# Patient Record
Sex: Male | Born: 1975 | Hispanic: No | Marital: Married | Smoking: Never smoker
Health system: Southern US, Community
[De-identification: ages and names within clinical notes are randomized; demographics above are authoritative.]

## PROBLEM LIST (undated history)

## (undated) DIAGNOSIS — F1020 Alcohol dependence, uncomplicated: Secondary | ICD-10-CM

## (undated) DIAGNOSIS — I10 Essential (primary) hypertension: Secondary | ICD-10-CM

## (undated) DIAGNOSIS — E119 Type 2 diabetes mellitus without complications: Secondary | ICD-10-CM

## (undated) DIAGNOSIS — E785 Hyperlipidemia, unspecified: Secondary | ICD-10-CM

## (undated) DIAGNOSIS — E669 Obesity, unspecified: Secondary | ICD-10-CM

## (undated) DIAGNOSIS — K760 Fatty (change of) liver, not elsewhere classified: Secondary | ICD-10-CM

## (undated) DIAGNOSIS — K219 Gastro-esophageal reflux disease without esophagitis: Secondary | ICD-10-CM

## (undated) DIAGNOSIS — R000 Tachycardia, unspecified: Secondary | ICD-10-CM

## (undated) HISTORY — DX: Tachycardia, unspecified: R00.0

## (undated) HISTORY — DX: Alcohol dependence, uncomplicated: F10.20

## (undated) HISTORY — DX: Gastro-esophageal reflux disease without esophagitis: K21.9

## (undated) HISTORY — DX: Type 2 diabetes mellitus without complications: E11.9

## (undated) HISTORY — DX: Hyperlipidemia, unspecified: E78.5

## (undated) HISTORY — DX: Fatty (change of) liver, not elsewhere classified: K76.0

## (undated) HISTORY — PX: OTHER SURGICAL HISTORY: SHX169

---

## 2009-07-17 ENCOUNTER — Ambulatory Visit: Payer: Self-pay | Admitting: Pulmonary Disease

## 2009-07-17 DIAGNOSIS — K219 Gastro-esophageal reflux disease without esophagitis: Secondary | ICD-10-CM | POA: Insufficient documentation

## 2009-07-17 DIAGNOSIS — R109 Unspecified abdominal pain: Secondary | ICD-10-CM | POA: Insufficient documentation

## 2009-07-17 DIAGNOSIS — E669 Obesity, unspecified: Secondary | ICD-10-CM | POA: Insufficient documentation

## 2009-07-17 DIAGNOSIS — I10 Essential (primary) hypertension: Secondary | ICD-10-CM | POA: Insufficient documentation

## 2009-07-23 ENCOUNTER — Ambulatory Visit: Payer: Self-pay | Admitting: Pulmonary Disease

## 2009-07-23 ENCOUNTER — Ambulatory Visit (HOSPITAL_COMMUNITY): Admission: RE | Admit: 2009-07-23 | Discharge: 2009-07-23 | Payer: Self-pay | Admitting: Pulmonary Disease

## 2009-07-24 LAB — CONVERTED CEMR LAB
ALT: 27 units/L (ref 0–53)
AST: 30 units/L (ref 0–37)
Alkaline Phosphatase: 60 units/L (ref 39–117)
BUN: 15 mg/dL (ref 6–23)
Basophils Absolute: 0 10*3/uL (ref 0.0–0.1)
Basophils Relative: 0.4 % (ref 0.0–3.0)
Bilirubin Urine: NEGATIVE
Calcium: 9.1 mg/dL (ref 8.4–10.5)
Cholesterol: 190 mg/dL (ref 0–200)
Creatinine, Ser: 0.8 mg/dL (ref 0.4–1.5)
Eosinophils Absolute: 0.1 10*3/uL (ref 0.0–0.7)
Eosinophils Relative: 1.9 % (ref 0.0–5.0)
GFR calc non Af Amer: 123.09 mL/min (ref >60)
HDL: 43.9 mg/dL (ref >39.00)
Ketones, ur: NEGATIVE mg/dL
Lymphocytes Relative: 40.4 % (ref 12.0–46.0)
MCV: 88 fL (ref 78.0–100.0)
Monocytes Relative: 9.4 % (ref 3.0–12.0)
Neutro Abs: 3 10*3/uL (ref 1.4–7.7)
Neutrophils Relative %: 47.9 % (ref 43.0–77.0)
Potassium: 4.8 meq/L (ref 3.5–5.1)
Specific Gravity, Urine: >=1.03 (ref 1.000–1.030)
TSH: 1.67 microintl units/mL (ref 0.35–5.50)
Total Bilirubin: 0.9 mg/dL (ref 0.3–1.2)
Total CHOL/HDL Ratio: 4
Total Protein: 6.9 g/dL (ref 6.0–8.3)
Urobilinogen, UA: 0.2 (ref 0.0–1.0)
VLDL: 25.8 mg/dL (ref 0.0–40.0)
WBC: 6.2 10*3/uL (ref 4.5–10.5)
pH: 6 (ref 5.0–8.0)

## 2010-03-16 NOTE — Assessment & Plan Note (Signed)
Summary: CHEST PAIN--OK PER DR Ivelisse Culverhouse/[facility]   CC:  New patient physical....  History of Present Illness: Peter Keller is a 35 y/o gentleman, husb of Dr. Herma Carson, and referred to establish as a general medical pt for CPX...    Current Problems:   HYPERTENSION (ICD-401.9) - hx HBP x yrs most recently Rx w/ ATENOLOL 25mg /d... he relates it to his weight, noting that BP was OK in past w/ weight reduction... no known end-organ problems and he is essentially asymptomatic- denies HA, fatigue, visual changes, CP, palipit, dizziness, syncope, dyspnea, edema, etc...   ~  6/11:  BP= 160/100 initially, but improved to 140/80 after rest... we discussed incr Aten to 50mg /d, weight reducing diet, exercise, etc... hopefully he'll be able to wean down & off meds w/ wt reduction.  OVERWEIGHT (ICD-278.02) - weight = 218#, 69" tall, BMI= 32... he notes substantial weight reduction on diet in the past & believes he can do it again... seems motivated & we discussed diet + exercise (walking, swimming, etc).  GERD (ICD-530.81) - he uses PRILOSEC 20mg  Prn for intermittent reflux symptoms... no prev GI eval.  ABDOMINAL PAIN, UNSPECIFIED SITE (ICD-789.00) - he notes intermittent vague upper GI, RUQ, & bilat flank discomfort off & on... he's been worried about his GB & we discussed checking an abd ultrasound for completeness.  ~  6/11:  Abd Sonar is neg x for mild fatty liver, he has normal LFT's, & rec to get wt down...   Allergies (verified): No Known Drug Allergies  Comments:  Nurse/Medical Assistant: The patient's medications and allergies were reviewed with the patient and were updated in the Medication and Allergy Lists.  Past History:  Past Medical History: HYPERTENSION (ICD-401.9) OVERWEIGHT (ICD-278.02) GERD (ICD-530.81) ABDOMINAL PAIN, UNSPECIFIED SITE (ICD-789.00)  Past Surgical History: S/P drainage of left buttock abscess at age 62 after vaccination S/P anal fissure surg  ~2006 in  [place]  Family History: Reviewed history and no changes required. Father alive, age 627, good general health- mild HBP. Mother alive, age 81, hx HBP & DM on insulin. 2 Siblings- Sisters aged 50 & 71, one w/ HBP & overwt.  He notes that his paternal grandfather has had an MI, & paternal uncle w/ CABG...  Social History: Reviewed history and no changes required. Married, wife= Dr. Velta Addison, 53yrs. 2 Children- aged 2 & 4 Essentially a non-smoker (1 cigar per yr) Etoh= red wine daily Works as Nurse, adult  Review of Systems  The patient denies fever, chills, sweats, anorexia, fatigue, weakness, malaise, weight loss, sleep disorder, blurring, diplopia, eye irritation, eye discharge, vision loss, eye pain, photophobia, earache, ear discharge, tinnitus, decreased hearing, nasal congestion, nosebleeds, sore throat, hoarseness, chest pain, palpitations, syncope, dyspnea on exertion, orthopnea, PND, peripheral edema, cough, dyspnea at rest, excessive sputum, hemoptysis, wheezing, pleurisy, nausea, vomiting, diarrhea, constipation, change in bowel habits, abdominal pain, melena, hematochezia, jaundice, gas/bloating, indigestion/heartburn, dysphagia, odynophagia, dysuria, hematuria, urinary frequency, urinary hesitancy, nocturia, incontinence, back pain, joint pain, joint swelling, muscle cramps, muscle weakness, stiffness, arthritis, sciatica, restless legs, leg pain at night, leg pain with exertion, rash, itching, dryness, suspicious lesions, paralysis, paresthesias, seizures, tremors, vertigo, transient blindness, frequent falls, frequent headaches, difficulty walking, depression, anxiety, memory loss, confusion, cold intolerance, heat intolerance, polydipsia, polyphagia, polyuria, unusual weight change, abnormal bruising, bleeding, enlarged lymph nodes, urticaria, allergic rash, hay fever, and recurrent infections.    Vital Signs:  Patient profile:   35 year old male Height:      69 inches Weight:  218 pounds BMI:     32.31 O2 Sat:      98 % on Room air Temp:     98.2 degrees F oral Pulse rate:   86 / minute BP sitting:   158 / 100  (left arm) Cuff size:   regular  Vitals Entered By: Randell Loop CMA (July 17, 2009 3:09 PM)  O2 Sat at Rest %:  98 O2 Flow:  Room air  Physical Exam  Additional Exam:  WD, sl overweight, 35 y/o gentleman in NAD... GENERAL:  Alert & oriented; pleasant & cooperative... HEENT:  [place]/AT, EOM-wnl, PERRLA, EACs-clear, TMs-wnl, NOSE-clear, THROAT-clear & wnl. NECK:  Supple w/ full ROM; no JVD; normal carotid impulses w/o bruits; no thyromegaly or nodules palpated; no lymphadenopathy. CHEST:  Clear to P & A; without wheezes/ rales/ or rhonchi. HEART:  Regular Rhythm; without murmurs/ rubs/ or gallops. ABDOMEN:  Soft & nontender; normal bowel sounds; no organomegaly or masses detected. RECTAL:  Neg - prostate 2+ & nontender w/o nodules; stool hematest neg. Scar & defect left buttock from remote abscess drainage... EXT: without deformities or arthritic changes; no varicose veins/ venous insuffic/ or edema. NEURO:  CN's intact; motor testing normal; sensory testing normal; gait normal & balance OK. DERM:  No lesions noted; no rash etc...    CXR  Procedure date:  07/17/2009  Findings:      CHEST - 2 VIEW Comparison: None.   Findings: Heart and mediastinal contours are within normal limits. The lung fields are clear with no signs of focal infiltrate or congestive failure.  No pleural fluid or peribronchial cuffing is seen.  Bony structures appear intact.   IMPRESSION: No acute cardiopulmonary abnormality noted.   Read By:  Bertha Stakes,  M.D.    EKG  Procedure date:  07/17/2009  Findings:      Normal sinus rhythm with rate of:  76/ min... Tracing is WNL, NAD...  SN   MISC. Report  Procedure date:  07/23/2009  Findings:      Lipid Panel (LIPID)   Cholesterol               190 mg/dL                   0-454   Triglycerides              129.0 mg/dL                 0.9-811.9   HDL                       14.78 mg/dL                 >29.56   LDL Cholesterol      [H]  213 mg/dL                   0-86  BMP (METABOL)   Sodium                    142 mEq/L                   135-145   Potassium                 4.8 mEq/L                   3.5-5.1   Chloride  107 mEq/L                   96-112   Carbon Dioxide            28 mEq/L                    19-32   Glucose                   98 mg/dL                    34-74   BUN                       15 mg/dL                    2-59   Creatinine                0.8 mg/dL                   5.6-3.8   Calcium                   9.1 mg/dL                   7.5-64.3   GFR                       123.09 mL/min               >60  Hepatic/Liver Function Panel (HEPATIC)   Total Bilirubin           0.9 mg/dL                   3.2-9.5   Direct Bilirubin          0.1 mg/dL                   1.8-8.4   Alkaline Phosphatase      60 U/L                      39-117   AST                       30 U/L                      0-37   ALT                       27 U/L                      0-53   Total Protein             6.9 g/dL                    1.6-6.0   Albumin                   4.1 g/dL                    6.3-0.1  Comments:      CBC Platelet w/Diff (CBCD)   White Cell Count          6.2 K/uL  4.5-10.5   Red Cell Count            5.08 Mil/uL                 4.22-5.81   Hemoglobin                16.0 g/dL                   16.1-09.6   Hematocrit                44.7 %                      39.0-52.0   MCV                       88.0 fl                     78.0-100.0   Platelet Count            200.0 K/uL                  150.0-400.0   Neutrophil %              47.9 %                      43.0-77.0   Lymphocyte %              40.4 %                      12.0-46.0   Monocyte %                9.4 %                       3.0-12.0   Eosinophils%              1.9 %                        0.0-5.0   Basophils %               0.4 %                       0.0-3.0  TSH (TSH)   FastTSH                   1.67 uIU/mL                 0.35-5.50  UDip Only (UDIP)   Color                     LT. YELLOW   Clarity                   CLEAR                       Clear   Specific Gravity          >=1.030                     1.000 - 1.030   Urine Ph                  6.0  5.0-8.0   Protein                   NEGATIVE                    Negative   Urine Glucose             NEGATIVE                    Negative   Ketones                   NEGATIVE                    Negative   Urine Bilirubin           NEGATIVE                    Negative   Blood                     TRACE-LYSED                 Negative  Urobilinogen              0.2                         0.0 - 1.0   Leukocyte Esterace        NEGATIVE                    Negative   Nitrite                   NEGATIVE                    Negative  Tests: (7) Hemoglobin A1C (A1C)   Hemoglobin A1C            5.4 %                       4.6-6.5   [place] of Abdomen  Procedure date:  07/23/2009  Findings:      IMPRESSION: Question mild fatty infiltration of the liver, otherwise unremarkable abdominal ultrasound.   Read By:  Rosendo Gros,  M.D.    Impression & Recommendations:  Problem # 1:  PHYSICAL EXAMINATION (ICD-V70.0)  Orders: EKG w/ Interpretation (93000) T-2 View CXR (71020TC) He will return to our lab for FASTING blood work >> all essentially WNL, LDL = 120, rec diet Rx.  Problem # 2:  HYPERTENSION (ICD-401.9) BP initially  ~160/100 & improved to 140/80 w/ rest... we discussed incr the ATENOLOL to 50mg /d & monitor BP at home... he will increase exercise program as well... His updated medication list for this problem includes:    Atenolol 50 Mg Tabs (Atenolol) .Marland Kitchen... Take 1 tab by mouth once daily...  Problem # 3:  OVERWEIGHT (ICD-278.02) BMI= 32 and we discussed diet + exercise for weight  reduction program... he actually lost substantial weight in the past & believes that he can do it again...  Problem # 4:  GERD (ICD-530.81) Hx mild intermittent GERD symptoms in the past>  responded well to [facility] OTC. His updated medication list for this problem includes:    Prilosec Otc 20 Mg Tbec (Omeprazole magnesium) .Marland Kitchen... As needed for heartburn  Problem # 5:  ABDOMINAL PAIN, UNSPECIFIED SITE (ICD-789.00) He is concerned about his gallbladder  and we discussed checking routine labs and an abd ultrasound for completeness... LABS & ABD ULTRASOUND are neg, WNL x for mild fatty liver which should improve w/ weight reduction & moderation of the wine intake.  Orders: Radiology Referral (Radiology)  Complete Medication List: 1)  Atenolol 50 Mg Tabs (Atenolol) .... Take 1 tab by mouth once daily.Marland KitchenMarland Kitchen 2)  Prilosec Otc 20 Mg Tbec (Omeprazole magnesium) .... As needed for heartburn  Patient Instructions: 1)  Sonny, it was great meeting you.Marland KitchenMarland Kitchen 2)  Today we did your initial CXR & EKG... 3)  Please return to our lab one morning next week for your FASTING blood work... 4)  We will arrange for an ABDOMINAL ULTRASOUND as well... 5)  We will call you w/ these results and mail copies for your records... 6)  We wrote a new perscription for ATENOLOL 50mg  - take one tab daily.Marland KitchenMarland Kitchen 7)  We need to work on diet & exercise- the goal is to lose 15-20 lbs... 8)  Call for any problems... Prescriptions: ATENOLOL 50 MG TABS (ATENOLOL) take 1 tab by mouth once daily...  #90 x prn   Entered and Authorized by:   Michele Mcalpine MD   Signed by:   Michele Mcalpine MD on 07/17/2009   Method used:   Print then Give to Patient   RxID:   [id number]    CardioPerfect ECG  ID: [id number] Patient: Kenn File DOB: [birth year] Age: 35 Years Old Sex: Male Race: Other Physician: Nolia Tschantz Technician: Randell Loop CMA Height: 69 Weight: 218 Status: Unconfirmed Recorded: 07/17/2009 3:25 PM P/PR: 122 ms / 183  ms - Heart rate (maximum exercise) QRS: 89 QT/QTc/QTd: 360 ms / 388 ms / 48 ms - Heart rate (maximum exercise)  P/QRS/T axis: 34 deg / 59 deg / 66 deg - Heart rate (maximum exercise)  Heartrate: 76 bpm  Interpretation:  Normal sinus rhythm with rate of:  76/ min... Tracing is WNL, NAD...  SN

## 2010-04-09 ENCOUNTER — Telehealth (INDEPENDENT_AMBULATORY_CARE_PROVIDER_SITE_OTHER): Payer: Self-pay | Admitting: *Deleted

## 2010-04-13 NOTE — Progress Notes (Signed)
  Phone Note Other Incoming   Request: Send information Summary of Call: Request for records received from [facility].Request forwarded to [employer].  5 yrs-Nadel

## 2010-12-27 ENCOUNTER — Other Ambulatory Visit: Payer: Self-pay

## 2010-12-27 ENCOUNTER — Encounter (HOSPITAL_COMMUNITY): Payer: Self-pay

## 2010-12-27 ENCOUNTER — Ambulatory Visit (HOSPITAL_COMMUNITY)
Admission: RE | Admit: 2010-12-27 | Discharge: 2010-12-27 | Disposition: A | Discharge status: Home / Self Care | Payer: 59 | Source: Ambulatory Visit | Attending: Internal Medicine | Admitting: Internal Medicine

## 2010-12-27 VITALS — BP 144/86 | HR 75 | Ht 69.0 in | Wt 228.5 lb

## 2010-12-27 DIAGNOSIS — R0602 Shortness of breath: Secondary | ICD-10-CM | POA: Insufficient documentation

## 2010-12-27 DIAGNOSIS — R0683 Snoring: Secondary | ICD-10-CM | POA: Insufficient documentation

## 2010-12-27 DIAGNOSIS — R072 Precordial pain: Secondary | ICD-10-CM | POA: Insufficient documentation

## 2010-12-27 DIAGNOSIS — R0789 Other chest pain: Secondary | ICD-10-CM

## 2010-12-27 DIAGNOSIS — F1020 Alcohol dependence, uncomplicated: Secondary | ICD-10-CM | POA: Insufficient documentation

## 2010-12-27 DIAGNOSIS — R079 Chest pain, unspecified: Secondary | ICD-10-CM | POA: Insufficient documentation

## 2010-12-27 DIAGNOSIS — R0989 Other specified symptoms and signs involving the circulatory and respiratory systems: Secondary | ICD-10-CM | POA: Insufficient documentation

## 2010-12-27 DIAGNOSIS — R0609 Other forms of dyspnea: Secondary | ICD-10-CM | POA: Insufficient documentation

## 2010-12-27 DIAGNOSIS — E785 Hyperlipidemia, unspecified: Secondary | ICD-10-CM | POA: Insufficient documentation

## 2010-12-27 HISTORY — DX: Obesity, unspecified: E66.9

## 2010-12-27 HISTORY — DX: Essential (primary) hypertension: I10

## 2010-12-27 LAB — CBC
HCT: 45.4 % (ref 39.0–52.0)
Hemoglobin: 16.9 g/dL (ref 13.0–17.0)
MCH: 31.4 pg (ref 26.0–34.0)
MCHC: 37.2 g/dL — ABNORMAL HIGH (ref 30.0–36.0)
Platelets: 177 10*3/uL (ref 150–400)
RDW: 12.8 % (ref 11.5–15.5)

## 2010-12-27 LAB — LIPID PANEL
Cholesterol: 221 mg/dL — ABNORMAL HIGH (ref 0–200)
Total CHOL/HDL Ratio: 3.9 RATIO
Triglycerides: 129 mg/dL (ref <150)

## 2010-12-27 LAB — COMPREHENSIVE METABOLIC PANEL
ALT: 48 U/L (ref 0–53)
Albumin: 4 g/dL (ref 3.5–5.2)
Chloride: 101 mEq/L (ref 96–112)
Creatinine, Ser: 0.73 mg/dL (ref 0.50–1.35)
GFR calc non Af Amer: >90 mL/min (ref >90)
Glucose, Bld: 110 mg/dL — ABNORMAL HIGH (ref 70–99)
Potassium: 4.2 mEq/L (ref 3.5–5.1)
Total Bilirubin: 0.6 mg/dL (ref 0.3–1.2)
Total Protein: 7.9 g/dL (ref 6.0–8.3)

## 2010-12-27 LAB — TSH: TSH: 2.055 u[IU]/mL (ref 0.350–4.500)

## 2010-12-27 MED ORDER — LISINOPRIL-HYDROCHLOROTHIAZIDE 20-12.5 MG PO TABS: 1.0000 | ORAL_TABLET | Freq: Every day | ORAL | Status: DC

## 2010-12-27 NOTE — Patient Instructions (Signed)
Wean down your Atenolol and stop Start Lisinopril/HCTZ 20/12.5 mg daily  Labs today  Your physician has requested that you have an echocardiogram. Echocardiography is a painless test that uses sound waves to create images of your heart. It provides your doctor with information about the size and shape of your heart and how well your heart's chambers and valves are working. This procedure takes approximately one hour. There are no restrictions for this procedure.  Your physician has requested that you have an exercise tolerance test. For further information please visit [email or web address] Please also follow instruction sheet, as given.  Your physician recommends that you schedule a follow-up appointment in: 3 months.

## 2010-12-27 NOTE — Assessment & Plan Note (Signed)
Suspect due primarily to obesity and deconditioning but will check echo to evaluate EF, diastolic parameters and right sided pressures. Encouraged to lose weight through diet and exercise.

## 2010-12-27 NOTE — Progress Notes (Signed)
  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 Isidoro Donning for further evaluation of CP and HTN.  Denies h/o cardiac problems. Has not had a stress test or cath. Over past 10 years has gained about 80 pounds. Started on Atenolol about 2-3 years ago. Joined a gym in June and was working out aggressively for 3 months with boot camp. However over past 2 months has gotten discouraged so is no longer going. Says he was able to do very well with strength activities but had trouble with cardio exercises. When he exercises feels tight in his chest and gets winded very easily. Also notes that he is a heavy sweater during exercise and at rest. No orthopnea, PND or lower extremity edema.   Eats a lot of pizza and drinks a bottle of wine 3-4x/week. Says diet typically pretty good but when he drinks his eating is out of control. Snores heavily and often wakes himself from sleep at night. Fatigued during the day and drinks coffee for a boost.   ROS: All other systems normal except as mentioned in HPI, past medical history and problem list.    Past Medical History  Diagnosis Date  . Hypertension   . Obesity     Medications: Atenolo 50mg  daily    No Known Allergies  History   Social History  . Marital Status: Married    Spouse Name: N/A    Number of Children: N/A  . Years of Education: N/A   Occupational History  . Not on file.   Social History Main Topics  . Smoking status: Never Smoker   . Smokeless tobacco: Not on file  . Alcohol Use: 1.5 oz/week    3 drink(s) per week  . Drug Use: No  . Sexually Active: Not on file   Other Topics Concern  . Not on file   Works as a Sport and exercise psychologist. Drinks a bottle of wine - 3-4x/week  Family History  Problem Relation Age of Onset  . Diabetic kidney disease Mother   . Hypertension Mother   . Heart disease  Paternal Uncle   . Heart attack Paternal Grandmother     PHYSICAL EXAM: Filed Vitals:   12/27/10 1004  BP: 144/86  Pulse: 75    General:  Obese. Mild diaphoresis No respiratory difficulty HEENT: normal [place] 3-4 airway Neck: thick/ supple. no JVD. Carotids 2+ bilat; no bruits. No lymphadenopathy or thryomegaly appreciated. Cor: PMI nondisplaced. Regular rate & rhythm. No rubs, gallops or murmurs. Lungs: clear Abdomen: obese, soft, nontender, nondistended. No hepatosplenomegaly. No bruits or masses. Good bowel sounds. Extremities: no cyanosis, clubbing, rash, edema Neuro: alert & oriented x 3, cranial nerves grossly intact. moves all 4 extremities w/o difficulty. Affect pleasant.  ECG:  NSR  No ST-T wave abnormalities.     Lipid Panel     Component Value Date/Time   CHOL 190 07/23/2009 0946   TRIG 129.0 07/23/2009 0946   HDL 43.90 07/23/2009 0946   CHOLHDL 4 07/23/2009 0946   VLDL 25.8 07/23/2009 0946   LDLCALC 120* 07/23/2009 0946

## 2010-12-27 NOTE — Assessment & Plan Note (Addendum)
Will recheck lipids, TSH and HgBA1c. Stressed need for diet, weight loss and exercise.

## 2010-12-27 NOTE — Assessment & Plan Note (Signed)
He almost certainly has OSA. We discussed referral for sleep study but he would like to defer and try weight loss. Will give him 3 months to make a concerted effort at weight loss. If not successful, refer for sleep study.

## 2010-12-27 NOTE — Assessment & Plan Note (Signed)
He has several cardiac risk factors. Will schedule ETT to exclude underlying ischemia.

## 2010-12-27 NOTE — Assessment & Plan Note (Signed)
This is a major issue for him. We discussed cutting back on ETOH but he said once he has one glass of wine usually ends up drinking the whole bottom. Stressed the need for complete abstinence. Talked about long-term risk of alcoholism.

## 2010-12-31 ENCOUNTER — Telehealth (HOSPITAL_COMMUNITY): Payer: Self-pay | Admitting: *Deleted

## 2010-12-31 MED ORDER — SIMVASTATIN 20 MG PO TABS: 20.0000 mg | ORAL_TABLET | Freq: Every evening | ORAL | Status: DC

## 2010-12-31 NOTE — Telephone Encounter (Signed)
Pt's wife aware, rx sent in 

## 2010-12-31 NOTE — Telephone Encounter (Signed)
Message copied by Noralee Space on Fri Dec 31, 2010 12:00 PM ------      Message from: Arvilla Meres R      Created: Mon Dec 27, 2010 10:19 PM       Cholesterol is high. Start simva 20. Blood glucose also a bit high. Reinforced need for diet and weight loss. I will forward to his wife.

## 2011-01-17 ENCOUNTER — Encounter (HOSPITAL_COMMUNITY): Payer: Medicare HMO

## 2011-01-17 ENCOUNTER — Other Ambulatory Visit (HOSPITAL_COMMUNITY): Payer: Self-pay | Admitting: Internal Medicine

## 2011-01-17 DIAGNOSIS — I509 Heart failure, unspecified: Secondary | ICD-10-CM

## 2011-01-18 ENCOUNTER — Encounter: Payer: Self-pay | Admitting: Cardiovascular Disease

## 2011-01-19 ENCOUNTER — Ambulatory Visit (HOSPITAL_COMMUNITY): Payer: Managed Care, Other (non HMO) | Attending: Cardiovascular Disease | Admitting: Radiology

## 2011-01-19 ENCOUNTER — Ambulatory Visit (INDEPENDENT_AMBULATORY_CARE_PROVIDER_SITE_OTHER): Payer: Managed Care, Other (non HMO) | Admitting: Physician Assistant

## 2011-01-19 DIAGNOSIS — I379 Nonrheumatic pulmonary valve disorder, unspecified: Secondary | ICD-10-CM | POA: Insufficient documentation

## 2011-01-19 DIAGNOSIS — I10 Essential (primary) hypertension: Secondary | ICD-10-CM | POA: Insufficient documentation

## 2011-01-19 DIAGNOSIS — R079 Chest pain, unspecified: Secondary | ICD-10-CM | POA: Insufficient documentation

## 2011-01-19 DIAGNOSIS — R0609 Other forms of dyspnea: Secondary | ICD-10-CM | POA: Insufficient documentation

## 2011-01-19 DIAGNOSIS — R072 Precordial pain: Secondary | ICD-10-CM

## 2011-01-19 DIAGNOSIS — R0602 Shortness of breath: Secondary | ICD-10-CM

## 2011-01-19 DIAGNOSIS — E785 Hyperlipidemia, unspecified: Secondary | ICD-10-CM | POA: Insufficient documentation

## 2011-01-19 DIAGNOSIS — I509 Heart failure, unspecified: Secondary | ICD-10-CM

## 2011-01-19 DIAGNOSIS — R0989 Other specified symptoms and signs involving the circulatory and respiratory systems: Secondary | ICD-10-CM | POA: Insufficient documentation

## 2011-01-19 NOTE — Progress Notes (Signed)
Exercise Treadmill Test  Pre-Exercise Testing Evaluation Rhythm: normal sinus  Rate: 99   PR:  .15 QRS:  .08  QT:  .34 QTc: .44     Test  Exercise Tolerance Test Ordering MD: Arvilla Meres, MD  Interpreting MD:  Tereso Newcomer PA-C  Unique Test No: 1  Treadmill:  1  Indication for ETT: chest pain - rule out ischemia  Contraindication to ETT: No   Stress Modality: exercise - treadmill  Cardiac Imaging Performed: non   Protocol: standard Bruce - maximal  Max BP: 189/64  Max MPHR (bpm):  185 85% MPR (bpm):  157  MPHR obtained (bpm):  184 % MPHR obtained:  99%  Reached 85% MPHR (min:sec):  7:58 Total Exercise Time (min-sec):  11:00  Workload in METS:  13.4 Borg Scale: 14  Reason ETT Terminated:  patient's desire to stop    ST Segment Analysis At Rest: normal ST segments - no evidence of significant ST depression With Exercise: no evidence of significant ST depression  Other Information Arrhythmia:  No Angina during ETT:  absent (0) Quality of ETT:  diagnostic  ETT Interpretation:  normal - no evidence of ischemia by ST analysis  Comments: Good exercise tolerance. No chest pain. Normal BP response to exercise. No ST-T changes to suggest ischemia.   Recommendations: Follow up with Dr. Arvilla Meres as directed.

## 2011-04-21 ENCOUNTER — Encounter (HOSPITAL_COMMUNITY): Payer: Managed Care, Other (non HMO)

## 2011-07-19 ENCOUNTER — Other Ambulatory Visit (HOSPITAL_COMMUNITY): Payer: Self-pay | Admitting: Internal Medicine

## 2011-07-20 ENCOUNTER — Telehealth (HOSPITAL_COMMUNITY): Payer: Self-pay | Admitting: *Deleted

## 2011-07-20 DIAGNOSIS — E785 Hyperlipidemia, unspecified: Secondary | ICD-10-CM

## 2011-07-20 MED ORDER — LOSARTAN POTASSIUM-HCTZ 100-25 MG PO TABS: 1.0000 | ORAL_TABLET | Freq: Every day | ORAL | Status: DC

## 2011-07-20 NOTE — Telephone Encounter (Signed)
Per Dr Gala Romney, pt has developed a cough from Lisinopril will change him to Hyzaar 100/25 mg, rx sent in, also pt needs fasting lipid and cmet.  Patient is aware and he will go to [facility] 6/11 for labs

## 2011-07-26 ENCOUNTER — Other Ambulatory Visit (INDEPENDENT_AMBULATORY_CARE_PROVIDER_SITE_OTHER): Payer: Managed Care, Other (non HMO)

## 2011-07-26 DIAGNOSIS — E785 Hyperlipidemia, unspecified: Secondary | ICD-10-CM

## 2011-07-26 LAB — BASIC METABOLIC PANEL
BUN: 22 mg/dL (ref 6–23)
CO2: 27 mEq/L (ref 19–32)
Glucose, Bld: 116 mg/dL — ABNORMAL HIGH (ref 70–99)
Sodium: 139 mEq/L (ref 135–145)

## 2011-07-26 LAB — HEPATIC FUNCTION PANEL
ALT: 45 U/L (ref 0–53)
Albumin: 4.2 g/dL (ref 3.5–5.2)
Alkaline Phosphatase: 66 U/L (ref 39–117)
Total Protein: 7.7 g/dL (ref 6.0–8.3)

## 2011-07-26 LAB — LIPID PANEL
Total CHOL/HDL Ratio: 4
VLDL: 19 mg/dL (ref 0.0–40.0)

## 2011-08-01 ENCOUNTER — Encounter (HOSPITAL_COMMUNITY): Payer: Self-pay | Admitting: *Deleted

## 2012-02-03 ENCOUNTER — Other Ambulatory Visit (HOSPITAL_COMMUNITY): Payer: Self-pay | Admitting: Internal Medicine

## 2012-03-22 ENCOUNTER — Other Ambulatory Visit (HOSPITAL_COMMUNITY): Payer: Self-pay | Admitting: Internal Medicine

## 2012-08-06 ENCOUNTER — Other Ambulatory Visit (HOSPITAL_COMMUNITY): Payer: Self-pay | Admitting: Internal Medicine

## 2013-07-11 ENCOUNTER — Ambulatory Visit (INDEPENDENT_AMBULATORY_CARE_PROVIDER_SITE_OTHER): Payer: Managed Care, Other (non HMO) | Admitting: Cardiovascular Disease

## 2013-07-11 ENCOUNTER — Encounter: Payer: Self-pay | Admitting: Cardiovascular Disease

## 2013-07-11 VITALS — BP 130/80 | HR 103 | Resp 16 | Ht 69.0 in | Wt 205.1 lb

## 2013-07-11 DIAGNOSIS — R3589 Other polyuria: Secondary | ICD-10-CM

## 2013-07-11 DIAGNOSIS — R358 Other polyuria: Secondary | ICD-10-CM

## 2013-07-11 DIAGNOSIS — I10 Essential (primary) hypertension: Secondary | ICD-10-CM

## 2013-07-11 DIAGNOSIS — Z131 Encounter for screening for diabetes mellitus: Secondary | ICD-10-CM

## 2013-07-11 DIAGNOSIS — E785 Hyperlipidemia, unspecified: Secondary | ICD-10-CM

## 2013-07-11 DIAGNOSIS — R0789 Other chest pain: Secondary | ICD-10-CM

## 2013-07-11 DIAGNOSIS — R079 Chest pain, unspecified: Secondary | ICD-10-CM

## 2013-07-11 DIAGNOSIS — Z79899 Other long term (current) drug therapy: Secondary | ICD-10-CM

## 2013-07-11 NOTE — Patient Instructions (Signed)
Your physician recommends that you schedule a follow-up appointment in: 3 months  Your physician recommends that you return for lab work in: CMP, FASTING LIPIDS, A1C  Your physician has requested that you have an exercise tolerance test. For further information please visit [email or web address] Please also follow instruction sheet, as given.

## 2013-07-14 ENCOUNTER — Encounter: Payer: Self-pay | Admitting: Cardiovascular Disease

## 2013-07-14 NOTE — Progress Notes (Signed)
Patient ID: Peter Keller, male   DOB: 08/16/1975, 38 y.o.   MRN: [id number]      Reason for office visit Chest pain  Peter Keller is a delightful 38 year old man, whose wife is an internal medicine physician. He has been experiencing atypical chest discomfort that comes and goes it is never related to activity. Sometimes it occurs immediately after a meal has features suggestive of acid reflux, other times it occurs randomly. He denies dyspnea but seems to be rather sedentary. He does take walks a relatively slow pace for about 30 minutes, 5 days a week.   He has systemic hypertension, is borderline obese and has a strong family history of diabetes including serious complications (renal disease). He does not smoke cigarettes. He drinks alcohol habitually, usually a bottle of wine in one evening. His major dietary weakness his pizza. He has actually lost roughly 15 pounds over the last few months. 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  He is often thirsty and has polyuria, especially at night. He complains of a dry mouth.  His antihypertensive contains a thiazide diuretic and he thought that this may be the cause of the symptoms. He does not have palpitations or syncope, but has occasional dizziness, mostly positional. He sometimes describes anxiety that is not related to any particular circumstance.  He had a normal echocardiogram in 2012. A treadmill stress test was ordered at that time, but I'm not sure was completed. In 2012 he was prescribed simvastatin for hypercholesterolemia. He is no longer taking it. His hemoglobin A1c was normal at 5.6% at that time as was his TSH.   No Known Allergies  Current Outpatient Prescriptions  Medication Sig Dispense Refill  . losartan-hydrochlorothiazide (HYZAAR) 100-25 MG per tablet TAKE 1 TABLET BY MOUTH DAILY  30 tablet  1  . omeprazole (PRILOSEC) 20 MG capsule Take 20 mg by mouth daily.       No current facility-administered  medications for this visit.    Past Medical History  Diagnosis Date  . Hypertension   . Obesity     No past surgical history on file.  Family History  Problem Relation Age of Onset  . Diabetic kidney disease Mother   . Hypertension Mother   . Heart disease Paternal Uncle   . Heart attack Paternal Grandmother     History   Social History  . Marital Status: Married    Spouse Name: N/A    Number of Children: N/A  . Years of Education: N/A   Occupational History  . Not on file.   Social History Main Topics  . Smoking status: Never Smoker   . Smokeless tobacco: Not on file  . Alcohol Use: 1.5 oz/week    3 drink(s) per week  . Drug Use: No  . Sexual Activity: Not on file   Other Topics Concern  . Not on file   Social History Narrative  . No narrative on file    Review of systems: The patient specifically denies dyspnea at rest or with exertion, orthopnea, paroxysmal nocturnal dyspnea, syncope, palpitations, focal neurological deficits, intermittent claudication, unexplained weight gain, cough, hemoptysis or wheezing.  The patient also denies abdominal pain, nausea, vomiting, dysphagia, diarrhea, constipation, dysuria, hematuria, frequency, urgency, abnormal bleeding or bruising, fever, chills, unexpected weight changes, mood swings, change in skin or hair texture, change in voice quality, auditory or visual problems, allergic reactions or rashes, new musculoskeletal complaints other than usual "aches and pains".   PHYSICAL  EXAM BP 130/80  Pulse 103  Resp 16  Ht $R'5\' 9"'Pv$  (1.753 m)  Wt 205 lb 1.6 oz (93.033 kg)  BMI 30.27 kg/m2  General: Alert, oriented x3, no distress Head: no evidence of trauma, PERRL, EOMI, no exophtalmos or lid lag, no myxedema, no xanthelasma; normal ears, nose and oropharynx Neck: normal jugular venous pulsations and no hepatojugular reflux; brisk carotid pulses without delay and no carotid bruits Chest: clear to auscultation, no signs of  consolidation by percussion or palpation, normal fremitus, symmetrical and full respiratory excursions Cardiovascular: normal position and quality of the apical impulse, regular rhythm, normal first and second heart sounds, no murmurs, rubs or gallops Abdomen: no tenderness or distention, no masses by palpation, no abnormal pulsatility or arterial bruits, normal bowel sounds, no hepatosplenomegaly Extremities: no clubbing, cyanosis or edema; 2+ radial, ulnar and brachial pulses bilaterally; 2+ right femoral, posterior tibial and dorsalis pedis pulses; 2+ left femoral, posterior tibial and dorsalis pedis pulses; no subclavian or femoral bruits Neurological: grossly nonfocal   EKG: Mild sinus tachycardia otherwise normal  Lipid Panel     Component Value Date/Time   CHOL 161 07/26/2011 1145   TRIG 95.0 07/26/2011 1145   HDL 44.40 07/26/2011 1145   CHOLHDL 4 07/26/2011 1145   VLDL 19.0 07/26/2011 1145   LDLCALC 98 07/26/2011 1145    BMET    Component Value Date/Time   NA 139 07/26/2011 1145   K 3.3* 07/26/2011 1145   CL 103 07/26/2011 1145   CO2 27 07/26/2011 1145   GLUCOSE 116* 07/26/2011 1145   BUN 22 07/26/2011 1145   CREATININE 1.3 07/26/2011 1145   CALCIUM 9.3 07/26/2011 1145   GFRNONAA >90 12/27/2010 1130   GFRAA >90 12/27/2010 1130     ASSESSMENT AND PLAN  His chest pain syndrome is quite atypical for angina pectoris but deserves further exploration. I think a routine treadmill stress test will be very helpful. It will also help US guide an exercise prescription, since I believe starting a program of regular physical activity will be helpful in more ways than one. We discussed his ideal weight and for now set a short term target of 190 pounds, waist line 34 inches.  I am concerned that his polyuria and polydipsia may be a sign of full-blown diabetes mellitus. He had borderline hyperglycemia 2 years ago and has gained weight since then. On the other hand the nocturia may be an expression  of alcohol-related diuresis. I have recommended that he restrict his alcohol consumption to 14 drinks a week. We'll recheck all his metabolic parameters.  He scores 9 points on the Epworth Sleepiness Scale, but most of his symptoms can be readily explained in other manners. I do not think he has sleep apnea.  Orders Placed This Encounter  Procedures  . Comp Met (CMET)  . Lipid Profile  . HgB A1c  . EKG 12-Lead  . Exercise tolerance test   Meds ordered this encounter  Medications  . omeprazole (PRILOSEC) 20 MG capsule    Sig: Take 20 mg by mouth daily.    Sosha Shepherd  Sanda Klein, MD, [facility] [phone] office [phone] pager

## 2013-07-18 ENCOUNTER — Encounter (HOSPITAL_COMMUNITY): Payer: Managed Care, Other (non HMO)

## 2013-07-23 LAB — COMPREHENSIVE METABOLIC PANEL
ALBUMIN: 4.2 g/dL (ref 3.5–5.2)
ALK PHOS: 72 U/L (ref 39–117)
ALT: 28 U/L (ref 0–53)
AST: 35 U/L (ref 0–37)
BILIRUBIN TOTAL: 0.8 mg/dL (ref 0.2–1.2)
BUN: 14 mg/dL (ref 6–23)
CO2: 25 meq/L (ref 19–32)
Calcium: 9.1 mg/dL (ref 8.4–10.5)
Chloride: 97 mEq/L (ref 96–112)
Creat: 0.68 mg/dL (ref 0.50–1.35)
Glucose, Bld: 314 mg/dL — ABNORMAL HIGH (ref 70–99)
POTASSIUM: 3.7 meq/L (ref 3.5–5.3)
SODIUM: 137 meq/L (ref 135–145)
Total Protein: 7.1 g/dL (ref 6.0–8.3)

## 2013-07-23 LAB — LIPID PANEL
CHOL/HDL RATIO: 8.4 ratio
Cholesterol: 287 mg/dL — ABNORMAL HIGH (ref 0–200)
HDL: 34 mg/dL — AB (ref >39)
Triglycerides: 971 mg/dL — ABNORMAL HIGH (ref <150)

## 2013-07-23 LAB — LDL CHOLESTEROL, DIRECT: LDL DIRECT: 119 mg/dL — AB

## 2013-07-23 LAB — HEMOGLOBIN A1C
Hgb A1c MFr Bld: 11 % — ABNORMAL HIGH (ref <5.7)
Mean Plasma Glucose: 269 mg/dL — ABNORMAL HIGH (ref <117)

## 2013-07-23 NOTE — Progress Notes (Signed)
Dr. Salena Saner called and gave lab results to patient.

## 2013-07-24 ENCOUNTER — Telehealth (HOSPITAL_COMMUNITY): Payer: Self-pay

## 2013-07-24 ENCOUNTER — Ambulatory Visit: Payer: Managed Care, Other (non HMO) | Admitting: Internal Medicine

## 2013-07-26 ENCOUNTER — Encounter: Payer: Self-pay | Admitting: Endocrinology

## 2013-07-26 ENCOUNTER — Ambulatory Visit (HOSPITAL_COMMUNITY)
Admission: RE | Admit: 2013-07-26 | Discharge: 2013-07-26 | Disposition: A | Discharge status: Home / Self Care | Payer: Managed Care, Other (non HMO) | Source: Ambulatory Visit | Attending: Cardiology | Admitting: Cardiology

## 2013-07-26 ENCOUNTER — Other Ambulatory Visit: Payer: Self-pay | Admitting: *Deleted

## 2013-07-26 ENCOUNTER — Ambulatory Visit (INDEPENDENT_AMBULATORY_CARE_PROVIDER_SITE_OTHER): Payer: Managed Care, Other (non HMO) | Admitting: Endocrinology

## 2013-07-26 VITALS — BP 136/98 | HR 112 | Temp 98.3°F | Resp 16 | Ht 68.0 in | Wt 208.4 lb

## 2013-07-26 DIAGNOSIS — E1165 Type 2 diabetes mellitus with hyperglycemia: Secondary | ICD-10-CM | POA: Insufficient documentation

## 2013-07-26 DIAGNOSIS — IMO0002 Reserved for concepts with insufficient information to code with codable children: Secondary | ICD-10-CM | POA: Insufficient documentation

## 2013-07-26 DIAGNOSIS — R079 Chest pain, unspecified: Secondary | ICD-10-CM

## 2013-07-26 DIAGNOSIS — I10 Essential (primary) hypertension: Secondary | ICD-10-CM

## 2013-07-26 DIAGNOSIS — I498 Other specified cardiac arrhythmias: Secondary | ICD-10-CM

## 2013-07-26 DIAGNOSIS — R000 Tachycardia, unspecified: Secondary | ICD-10-CM | POA: Insufficient documentation

## 2013-07-26 DIAGNOSIS — R1011 Right upper quadrant pain: Secondary | ICD-10-CM

## 2013-07-26 DIAGNOSIS — IMO0001 Reserved for inherently not codable concepts without codable children: Secondary | ICD-10-CM

## 2013-07-26 DIAGNOSIS — R0789 Other chest pain: Secondary | ICD-10-CM | POA: Insufficient documentation

## 2013-07-26 DIAGNOSIS — E785 Hyperlipidemia, unspecified: Secondary | ICD-10-CM

## 2013-07-26 MED ORDER — GLUCOSE BLOOD VI STRP: ORAL_STRIP | Status: DC

## 2013-07-26 MED ORDER — ONETOUCH DELICA LANCETS 33G MISC: Status: DC

## 2013-07-26 MED ORDER — SITAGLIP PHOS-METFORMIN HCL ER 50-1000 MG PO TB24: 50.0000 mg | ORAL_TABLET | Freq: Two times a day (BID) | ORAL | Status: DC

## 2013-07-26 NOTE — Patient Instructions (Addendum)
Please check blood sugars at least half the time about 2 hours after any meal and 3 times per week on waking up.  Please bring blood sugar monitor to each visit  Lower fat and Carbs with meals and get some protein at each meal, avoid pizza and fried meals and snacks  Reduce juices and alcohol intake  Janumet XR: Take 1 tablet of the sample daily with dinner and then take 2 tablets at dinner daily  Start walking program at least 15-20 minutes daily

## 2013-07-26 NOTE — Progress Notes (Signed)
Patient ID: Peter Keller, male   DOB: 02/17/1975, 38 y.o.   MRN: [id number]    Reason for Appointment: Consultation for Type 2 Diabetes  Referring physician: Dr. Isidoro Donningai  History of Present Illness:          Diagnosis: Type 2 diabetes mellitus, date of diagnosis: 6/15       Past history: He has had periodic screening for diabetes but none since 2012. Previous A1c tests have been normal and last random glucose was 110  Recent history:  For the last 2-3 months he has had increased thirst and urination. Getting up 2-3 times at night to urinate Also occasionally get some blurred vision. Does not think he has lost weight and does not feel he has any excessive fatigue However his weight is somewhat less than in 2012 when he was 228 pounds He was being evaluated by his cardiologist for atypical chest pain and because of his symptoms he had screening test done and was found to have frank diabetes with a glucose of 314 Urinalysis not done and rest of his chemistry was normal He apparently has gained weight gradually over the last 5 years Over the last 2 years has not been watching his diet and only recently has started changing some of his meals to salads He drinks orange juice in the morning regularly but otherwise avoiding drinks with sugar He has been referred now for further management       Oral hypoglycemic drugs the patient is taking are: None       Glucose monitoring:  none  Glycemic control:  Lab Results  Component Value Date   HGBA1C 11.0* 07/22/2013   HGBA1C 5.6 12/27/2010   HGBA1C 5.4 07/23/2009   Lab Results  Component Value Date   LDLCALC NOT CALC 07/22/2013   CREATININE 0.68 07/22/2013    Self-care: The diet that the patient has been following is: None He has a bagel every morning, usually eating a sandwich at lunch and evening meals are usually pizza. Has snacks on chips and cheese.     Meals: 3 meals per day.          Exercise: Some walking at work, no programmed exercise                       His last eye exam was about 2 years ago     Weight history: Wt Readings from Last 3 Encounters:  07/26/13 208 lb 6.4 oz (94.53 kg)  07/11/13 205 lb 1.6 oz (93.033 kg)  12/27/10 228 lb 8 oz (103.647 kg)      Medication List       This list is accurate as of: 07/26/13  2:38 PM.  Always use your most recent med list.               losartan-hydrochlorothiazide 100-25 MG per tablet  Commonly known as:  HYZAAR  TAKE 1 TABLET BY MOUTH DAILY     omeprazole 20 MG capsule  Commonly known as:  PRILOSEC  Take 20 mg by mouth daily.        Allergies: No Known Allergies  Past Medical History  Diagnosis Date  . Hypertension   . Obesity     No past surgical history on file.  Family History  Problem Relation Age of Onset  . Diabetic kidney disease Mother   . Hypertension Mother   . Heart disease Paternal Uncle   . Heart attack Paternal Grandmother  Social History:  reports that he has never smoked. He does not have any smokeless tobacco history on file. He reports that he drinks about 1.5 ounces of alcohol per week. He reports that he does not use illicit drugs. Recently has cut back his intake of wine and beer but previously drinking 3-4 glasses of wine a day  Review of Systems       Lipids: on Zocor 20 mg in 2012 for mild hypercholesterolemia, LDL 138. He took this for about a year but otherwise has not been treated Has not had high triglycerides in the past       Lab Results  Component Value Date   CHOL 287* 07/22/2013   HDL 34* 07/22/2013   LDLCALC NOT CALC 07/22/2013   LDLDIRECT 119* 07/22/2013   TRIG 971* 07/22/2013   CHOLHDL 8.4 07/22/2013        No unusual headaches.                  Skin: No rash or infections     Thyroid:  No  unusual fatigue. He says he tends to get hot easily. No palpitations but he says that his heart rate is usually past and has been for sometime  Lab Results  Component Value Date   TSH 2.055 12/27/2010       The blood  pressure has been treated with medications for 3-4 years, now taking Hyzaar. Overall has had high blood pressure for 6-7 years     No swelling of ankles.     No shortness of breath or chest discomfort on exertion. He did have a stress test this morning     Bowel habits: 2-3 bms in ams, not loose      He has had episodes of abdominal pain on and off for several years. Apparently had negative ultrasound in 2011 Has had more episodes in the last few months and this occurs in the right upper abdomen radiating to the back. Usually occurs after any meal and may last up to 2 hours. No associated nausea or vomiting.       No joint  Pains.     He has had late insomnia and may frequently get up at 3 AM      No  history of depression          No history of Numbness, tingling or burning in feet     LABS:  No visits with results within 1 Week(s) from this visit. Latest known visit with results is:  Office Visit on 07/11/2013  Component Date Value Ref Range Status  . Sodium 07/22/2013 137  135 - 145 mEq/L Final  . Potassium 07/22/2013 3.7  3.5 - 5.3 mEq/L Final  . Chloride 07/22/2013 97  96 - 112 mEq/L Final  . CO2 07/22/2013 25  19 - 32 mEq/L Final  . Glucose, Bld 07/22/2013 314* 70 - 99 mg/dL Final  . BUN 16/11/9602 14  6 - 23 mg/dL Final  . Creat 54/10/8117 0.68  0.50 - 1.35 mg/dL Final  . Total Bilirubin 07/22/2013 0.8  0.2 - 1.2 mg/dL Final  . Alkaline Phosphatase 07/22/2013 72  39 - 117 U/L Final  . AST 07/22/2013 35  0 - 37 U/L Final  . ALT 07/22/2013 28  0 - 53 U/L Final  . Total Protein 07/22/2013 7.1  6.0 - 8.3 g/dL Final  . Albumin 14/78/2956 4.2  3.5 - 5.2 g/dL Final  . Calcium 21/30/8657 9.1  8.4 -  10.5 mg/dL Final  . Cholesterol 16/11/9602 287* 0 - 200 mg/dL Final   Comment: ATP III Classification:                                < 200        mg/dL        Desirable                               200 - 239     mg/dL        Borderline High                               >= 240         mg/dL        High                             . Triglycerides 07/22/2013 971* <150 mg/dL Final  . HDL 54/10/8117 34* >39 mg/dL Final  . Total CHOL/HDL Ratio 07/22/2013 8.4   Final  . VLDL 07/22/2013 NOT CALC  0 - 40 mg/dL Corrected   Comment:                            Not calculated due to Triglyceride >400.                          Suggest ordering Direct LDL (Unit Code: 14782).  . LDL Cholesterol 07/22/2013 NOT CALC  0 - 99 mg/dL Final   Comment:                            Not calculated due to Triglyceride >400.                          Suggest ordering Direct LDL (Unit Code: 95621).                                                     Total Cholesterol/HDL Ratio:CHD Risk                                                 Coronary Heart Disease Risk Table                                                                 Men       Women                                   1/2 Average Risk  3.4        3.3                                       Average Risk              5.0        4.4                                    2X Average Risk              9.6        7.1                                    3X Average Risk             23.4       11.0                          Use the calculated Patient Ratio above and the CHD Risk table                           to determine the patient's CHD Risk.                          ATP III Classification (LDL):                                < 100        mg/dL         Optimal                               100 - 129     mg/dL         Near or Above Optimal                               130 - 159     mg/dL         Borderline High                               160 - 189     mg/dL         High                                > 190        mg/dL         Very High                             . Hemoglobin A1C 07/22/2013 11.0* <5.7 % Final   Comment:  According  to the ADA Clinical Practice Recommendations for 2011, when                          HbA1c is used as a screening test:                                                       >=6.5%   Diagnostic of Diabetes Mellitus                                     (if abnormal result is confirmed)                                                     5.7-6.4%   Increased risk of developing Diabetes Mellitus                                                     References:Diagnosis and Classification of Diabetes Mellitus,Diabetes                          Care,2011,34(Suppl 1):S62-S69 and Standards of Medical Care in                                  Diabetes - 2011,Diabetes Care,2011,34 (Suppl 1):S11-S61.                             . Mean Plasma Glucose 07/22/2013 269* <117 mg/dL Final  . Direct LDL 16/11/9602 119*  Final   Comment:                            ATP III Classification (LDL):                                < 100        mg/dL         Optimal                               100 - 129     mg/dL         Near or Above Optimal                               130 - 159     mg/dL         Borderline High                               160 - 189  mg/dL         High                                > 190        mg/dL         Very High                               Physical Examination:  BP 136/98  Pulse 112  Temp(Src) 98.3 F (36.8 C)  Resp 16  Ht 5\' 8"  (1.727 m)  Wt 208 lb 6.4 oz (94.53 kg)  BMI 31.69 kg/m2  SpO2 95%  GENERAL:         Patient has generalized obesity.mildly diaphoretic today    HEENT:         Eye exam shows normal external appearance. Fundus exam shows no retinopathy. Oral exam shows normal mucosa .  NECK:         General:  Neck exam shows no lymphadenopathy. Carotids are normal to palpation and no bruit heard.  Thyroid is not enlarged and no nodules felt.   LUNGS:         Chest is symmetrical. Lungs are clear to auscultation.Marland Kitchen.   HEART:         Heart sounds:  S1 and S2 are  normal. No murmurs or clicks heard., no S3 or S4.   ABDOMEN:   There is no distention present. Liver and spleen are not palpable. No other mass or tenderness present.  EXTREMITIES:     There is no edema. No skin lesions present.Marland Kitchen.  NEUROLOGICAL:   Vibration sense is nearly normal  in toes. Ankle jerks are absent bilaterally.          Diabetic foot exam:  normal sensation and pulses MUSCULOSKELETAL:       There is no enlargement or deformity of the joints. Spine is normal to inspection.Marland Kitchen.   SKIN:       No rash or lesions of concern.        ASSESSMENT:  1. Diabetes type 2, uncontrolled, newly diagnosed     He has significant hyperglycemia with A1c 11% although has only modest symptoms with this He does have a family history of diabetes as well as history of gradual weight gain over the last few years. Generally he does have a poor lifestyle with no programmed exercise, poor diet, excessive alcohol intake Discussed with the patient the causative factors and diabetes including insulin resistance as well as insulin deficiency He understands the role of weight loss in helping control his diabetes  Complications: None evident  2. Right upper quadrant pain, postprandial and suggestive of biliary colic and has been recurrent. His history and exam do not suggest pancreatitis  3. Hyperlipidemia: His prior history of pure hypercholesterolemia and now has mixed hyperlipidemia. Most of his hypertriglyceridemia is limited to his marked hyperglycemia and contributing factors are diet, alcohol intake and obesity  4. Hypertension: Blood pressure being managed by his cardiologist. Today has high blood pressure probably related to right heart syndrome  PLAN:  Have advised him that weight loss of about 10% would be desirable and adequate He will need to make significant changes in his diet especially with cutting back on high fat foods especially pizza and also excessive caloric intake from alcohol He was  advised to start a regular walking program for  exercise Pharmacological treatment will be done with 2 drug combination and for convenience he can take Januvia and metformin ER  together. Discussed how Januvia helps hyperglycemia and role of metformin in reducing insulin resistance He will start with a sample of Janumet XR 100/1000 daily for 1 week and then go to 50/1000, 2 tablets daily Demonstrated to him how to use of a One Touch Verio glucose monitor He does check blood sugars at least once a day alternating fasting and 2 hour postprandial and bring monitor for download on the next visit Consultation with dietitian for meal planning  Abdominal ultrasound. Advised him to establish with PCP for further management of general medical problems  For his hyperlipidemia he will need to make changes with his diet, reduce alcohol intake and should have improved triglycerides with better diabetes control and weight loss also  Long-standing sinus tachycardia: Will check thyroid levels with his next lab draw  [facility] 07/26/2013, 2:38 PM   Note: This office note was prepared with Dragon voice recognition system technology. Any transcriptional errors that result from this process are unintentional.

## 2013-08-07 ENCOUNTER — Ambulatory Visit
Admission: RE | Admit: 2013-08-07 | Discharge: 2013-08-07 | Disposition: A | Discharge status: Home / Self Care | Payer: 59 | Source: Ambulatory Visit | Attending: Endocrinology | Admitting: Endocrinology

## 2013-08-07 DIAGNOSIS — R1011 Right upper quadrant pain: Secondary | ICD-10-CM

## 2013-08-09 ENCOUNTER — Ambulatory Visit (INDEPENDENT_AMBULATORY_CARE_PROVIDER_SITE_OTHER): Payer: Managed Care, Other (non HMO) | Admitting: Endocrinology

## 2013-08-09 ENCOUNTER — Telehealth: Payer: Self-pay

## 2013-08-09 ENCOUNTER — Encounter: Payer: Self-pay | Admitting: Endocrinology

## 2013-08-09 VITALS — BP 130/82 | HR 95 | Temp 98.1°F | Ht 68.0 in | Wt 208.0 lb

## 2013-08-09 DIAGNOSIS — K760 Fatty (change of) liver, not elsewhere classified: Secondary | ICD-10-CM

## 2013-08-09 DIAGNOSIS — IMO0001 Reserved for inherently not codable concepts without codable children: Secondary | ICD-10-CM

## 2013-08-09 DIAGNOSIS — E785 Hyperlipidemia, unspecified: Secondary | ICD-10-CM

## 2013-08-09 DIAGNOSIS — E1165 Type 2 diabetes mellitus with hyperglycemia: Principal | ICD-10-CM

## 2013-08-09 DIAGNOSIS — K7689 Other specified diseases of liver: Secondary | ICD-10-CM

## 2013-08-09 DIAGNOSIS — I10 Essential (primary) hypertension: Secondary | ICD-10-CM

## 2013-08-09 MED ORDER — GLIMEPIRIDE 2 MG PO TABS: ORAL_TABLET | ORAL | Status: DC

## 2013-08-09 NOTE — Progress Notes (Signed)
Patient ID: [name], male   DOB: 08/03/1975, 38 y.o.   MRN: [id number]    Reason for Appointment: Consultation for Type 2 Diabetes   History of Present Illness:          Diagnosis: Type 2 diabetes mellitus, date of diagnosis: 6/15       Past history: He has had periodic screening for diabetes but none since 2012. Previous A1c tests have been normal and last random glucose was 110 Since about 3/15 he had been having increased thirst and urination and was found to have a glucose of 314 an A1c of 11%  Recent history:  He was started on Janumet XR and is taking the maximum dose now He takes this at lunch and dinner. Initially had mild loose stools but not now Less thirsty now, less sweating and frequent urination has resolved He has checked his blood sugars fairly regularly but only in the morning Recent blood sugars are looking better but still overall high and only recently below 200 He has done better with diet and cutting out higher fat foods and juices       Oral hypoglycemic drugs the patient is taking are: JanumetXR      Glucose monitoring:  fasting blood sugars recently 184-254, previously as high as 311. Lunchtime reading 259 about 2 weeks ago, overall median 228  Glycemic control:  Lab Results  Component Value Date   HGBA1C 11.0* 07/22/2013   HGBA1C 5.6 12/27/2010   HGBA1C 5.4 07/23/2009   Lab Results  Component Value Date   LDLCALC NOT CALC 07/22/2013   CREATININE 0.68 07/22/2013    Self-care: The diet that the patient has been following is: Reduced fat intake  Meals: 3 meals per day.          Exercise: Some walking                     His last eye exam was about 2 years ago     Weight history: Wt Readings from Last 3 Encounters:  08/09/13 208 lb (94.348 kg)  07/26/13 208 lb 6.4 oz (94.53 kg)  07/11/13 205 lb 1.6 oz (93.033 kg)      Medication List       This list is accurate as of: 08/09/13  9:24 AM.  Always use your most recent med list.                glucose blood test strip  Commonly known as:  ONETOUCH VERIO  Use as instructed to check blood sugar once per day dx code 250.02     losartan-hydrochlorothiazide 100-25 MG per tablet  Commonly known as:  HYZAAR  TAKE 1 TABLET BY MOUTH DAILY     omeprazole 20 MG capsule  Commonly known as:  PRILOSEC  Take 20 mg by mouth daily.     ONETOUCH DELICA LANCETS 33G Misc  Use to obtain a blood specimen once per day     SitaGLIPtin-MetFORMIN HCl 50-1000 MG Tb24  Commonly known as:  JANUMET XR  Take 50 mg by mouth 2 (two) times daily.        Allergies: No Known Allergies  Past Medical History  Diagnosis Date  . Hypertension   . Obesity     No past surgical history on file.  Family History  Problem Relation Age of Onset  . Diabetic kidney disease Mother   . Hypertension Mother   . Heart disease Paternal Uncle   . Heart attack Paternal  Grandmother     Social History:  reports that he has never smoked. He does not have any smokeless tobacco history on file. He reports that he drinks about 1.5 ounces of alcohol per week. He reports that he does not use illicit drugs. Recently has cut back his intake of wine and beer but previously drinking 3-4 glasses of wine a day  Review of Systems       Lipids: on Zocor 20 mg in 2012 for mild hypercholesterolemia, LDL 138. He took this for about a year but otherwise has not been treated Has not had high triglycerides in the past Most of his hypertriglyceridemia recently was related to his marked hyperglycemia and contributing factors are diet, alcohol intake and obesity       Lab Results  Component Value Date   CHOL 287* 07/22/2013   HDL 34* 07/22/2013   LDLCALC NOT CALC 07/22/2013   LDLDIRECT 119* 07/22/2013   TRIG 971* 07/22/2013   CHOLHDL 8.4 07/22/2013        Thyroid:  No  unusual fatigue.No palpitations but he says that his heart rate is usually past and has been for sometime  Lab Results  Component Value Date   TSH 2.055 12/27/2010        The blood pressure has been treated with medications for 3-4 years, now taking Hyzaar. Overall has had high blood pressure for 6-7 years     He has had episodes of abdominal pain on and off for several years.  Pain occurs in the right upper abdomen radiating to the back. Usually occurs after any meal and may last up to 2 hours. No associated nausea or vomiting. Recently his episodes are less frequent and less painful Ultrasound shows fatty liver with indeterminate area near the gallbladder   Physical Examination:  BP 130/82  Pulse 95  Temp(Src) 98.1 F (36.7 C) (Oral)  Ht 5\' 8"  (1.727 m)  Wt 208 lb (94.348 kg)  BMI 31.63 kg/m2  SpO2 95%      ASSESSMENT:  1. Diabetes type 2, uncontrolled, recently diagnosed     He has some improvement in his diabetes control with using Janumet XR and is tolerating full dose However his fasting blood sugars are still relatively high and appear to be plateauing around 180-190 Has not checked readings after meals as directed Overall he is trying to make improvement in his lifestyle with better diet and some walking  2. Right upper quadrant pain, postprandial and suggestive of biliary colic and has been recurrent. No evidence of cholelithiasis on recent ultrasound  3. Hepatic steatosis seen on ultrasound but liver functions are normal  PLAN:   Start Amaryl 2 mg at dinnertime in addition to Janumet XR  Continue to improve diet with reducing carbohydrates, fats and alcohol  Increase exercise  More blood sugars after lunch or dinner, discussed blood sugar targets. Will review his readings on the next visit and check fructosamine  CT scan of abdomen to evaluate area near the gallbladder seen on ultrasound  Consider gastroenterology consultation if abdominal pain persists.    Peter Keller 08/09/2013, 9:24 AM   Note: This office note was prepared with Dragon voice recognition system technology. Any transcriptional errors that result  from this process are unintentional.

## 2013-08-09 NOTE — Telephone Encounter (Signed)
Pt scheduled to see Dr. Rhea BeltonPyrtle 08/19/13@3pm . Pt aware of appt.

## 2013-08-09 NOTE — Patient Instructions (Signed)
Please check blood sugars at least half the time about 2 hours after any meal and times per week on waking up. Please bring blood sugar monitor to each visit  New rx at dinner

## 2013-08-09 NOTE — Addendum Note (Signed)
Addended by: Reather LittlerKUMAR, AJAY on: 08/09/2013 03:34 PM   Modules accepted: Orders

## 2013-08-12 ENCOUNTER — Ambulatory Visit
Admission: RE | Admit: 2013-08-12 | Discharge: 2013-08-12 | Disposition: A | Discharge status: Home / Self Care | Payer: 59 | Source: Ambulatory Visit | Attending: Endocrinology | Admitting: Endocrinology

## 2013-08-12 DIAGNOSIS — K760 Fatty (change of) liver, not elsewhere classified: Secondary | ICD-10-CM

## 2013-08-12 MED ORDER — GADOBENATE DIMEGLUMINE 529 MG/ML IV SOLN
19.0000 mL | Freq: Once | INTRAVENOUS | Status: AC | PRN
Start: 2013-08-12 — End: 2013-08-12
  Administered 2013-08-12: 19 mL via INTRAVENOUS

## 2013-08-14 ENCOUNTER — Telehealth: Payer: Self-pay | Admitting: Cardiovascular Disease

## 2013-08-14 MED ORDER — LOSARTAN POTASSIUM-HCTZ 100-25 MG PO TABS: 1.0000 | ORAL_TABLET | Freq: Every day | ORAL | Status: DC

## 2013-08-14 NOTE — Telephone Encounter (Signed)
Rx was sent to pharmacy electronically. 

## 2013-08-14 NOTE — Telephone Encounter (Signed)
Need a new prescription for Hyzaar 100/25 mg # 90 and refills please. [phone].

## 2013-08-15 NOTE — Telephone Encounter (Signed)
Encounter complete. 

## 2013-08-18 ENCOUNTER — Other Ambulatory Visit: Payer: 59

## 2013-08-19 ENCOUNTER — Encounter: Payer: Self-pay | Admitting: *Deleted

## 2013-08-19 ENCOUNTER — Other Ambulatory Visit (INDEPENDENT_AMBULATORY_CARE_PROVIDER_SITE_OTHER): Payer: Managed Care, Other (non HMO)

## 2013-08-19 ENCOUNTER — Ambulatory Visit (INDEPENDENT_AMBULATORY_CARE_PROVIDER_SITE_OTHER): Payer: Managed Care, Other (non HMO) | Admitting: Internal Medicine

## 2013-08-19 VITALS — BP 108/70 | HR 100

## 2013-08-19 DIAGNOSIS — K7689 Other specified diseases of liver: Secondary | ICD-10-CM

## 2013-08-19 DIAGNOSIS — R1011 Right upper quadrant pain: Secondary | ICD-10-CM

## 2013-08-19 DIAGNOSIS — R1013 Epigastric pain: Secondary | ICD-10-CM

## 2013-08-19 DIAGNOSIS — F1010 Alcohol abuse, uncomplicated: Secondary | ICD-10-CM

## 2013-08-19 DIAGNOSIS — K760 Fatty (change of) liver, not elsewhere classified: Secondary | ICD-10-CM

## 2013-08-19 DIAGNOSIS — E8881 Metabolic syndrome: Secondary | ICD-10-CM

## 2013-08-19 DIAGNOSIS — R748 Abnormal levels of other serum enzymes: Secondary | ICD-10-CM

## 2013-08-19 LAB — H. PYLORI ANTIBODY, IGG: H Pylori IgG: NEGATIVE

## 2013-08-19 MED ORDER — ESOMEPRAZOLE MAGNESIUM 40 MG PO CPDR: 40.0000 mg | DELAYED_RELEASE_CAPSULE | Freq: Every day | ORAL | Status: DC

## 2013-08-19 MED ORDER — VITAMIN E 400 UNITS PO TABS: 800.0000 [IU] | ORAL_TABLET | Freq: Every day | ORAL | Status: DC

## 2013-08-19 NOTE — Progress Notes (Signed)
Patient ID: Peter Keller, male   DOB: October 03, 1975, 38 y.o.   MRN: [id number] HPI: Peter Keller is a 38 yo male with PMH of diabetes, hyperlipidemia, hypertension, GERD, history of elevated liver enzymes who is seen to evaluate recent abnormal liver ultrasound and elevated LFTs along with epigastric abdominal pain. He is here today with his wife. Over the last year he has had issues with epigastric and right upper quadrant abdominal pain. He reports this is work 20-30 minutes after a meal. It can be sharp, but also burning. It has not been associated with nausea or vomiting. No dysphagia or odynophagia. He does have a history of heartburn and he started Prilosec over-the-counter 20 mg daily. This has helped his symptoms. It has also helped his epigastric discomfort but not completely resolved. He notices heartburn is worsened by alcohol and foods such as pizza. He has recently been diagnosed with diabetes and started on several medications. He's tried to dramatically change his diet and with this glucoses and symptoms have improved somewhat. He reports he was previously eating pizza 3-4 days per week and he has dramatically reduced fatty foods. He's also changed from drinking red wine to drinking liquor. Previously he was drinking approximately a bottle of wine a day, and on some days the bottles were the larger bottles.  He does feel he could stop without difficulty and denies previous withdrawal. He does say after not drinking for 4-5 days he craves a drink. He reports normal bowel movements without blood or melena. Regarding his heartburn symptoms improved dramatically on Nexium 20 mg over-the-counter. No dysphagia or odynophagia. No itching, jaundice, lower extremity edema or increasing abdominal girth  He did have an ultrasound performed to evaluate his right upper quadrant pain. This revealed fatty infiltration of the liver as well as raise the possibility of a 4 x 3 x 5 cm hepatic lesion. MRI was recommended  and already performed.   Patient does report a family history notable for cirrhosis in his maternal uncle due to alcohol.  Past Medical History  Diagnosis Date  . Hypertension   . Obesity   . Diabetes   . Hyperlipidemia   . Hepatic steatosis   . Sinus tachycardia   . Alcohol dependence   . GERD (gastroesophageal reflux disease)     Past Surgical History  Procedure Laterality Date  . None      Outpatient Prescriptions Prior to Visit  Medication Sig Dispense Refill  . glimepiride (AMARYL) 2 MG tablet Take before dinner  30 tablet  3  . glucose blood (ONETOUCH VERIO) test strip Use as instructed to check blood sugar once per day dx code 250.02  50 each  1  . losartan-hydrochlorothiazide (HYZAAR) 100-25 MG per tablet Take 1 tablet by mouth daily.  90 tablet  3  . ONETOUCH DELICA LANCETS 33G MISC Use to obtain a blood specimen once per day  100 each  1  . SitaGLIPtin-MetFORMIN HCl (JANUMET XR) 50-1000 MG TB24 Take 50 mg by mouth 2 (two) times daily.  60 tablet  2  . omeprazole (PRILOSEC) 20 MG capsule Take 20 mg by mouth daily.       No facility-administered medications prior to visit.    No Known Allergies  Family History  Problem Relation Age of Onset  . Colon cancer Neg Hx   . Hypertension Mother   . Heart disease Paternal Uncle   . Heart attack Paternal Grandmother   . Diabetes Mother   . Esophageal cancer Neg  Hx   . Stomach cancer Neg Hx     History  Substance Use Topics  . Smoking status: Never Smoker   . Smokeless tobacco: Never Used  . Alcohol Use: Yes     Comment: 3 alcoholic drinks daily    ROS: As per history of present illness, otherwise negative  BP 108/70  Pulse 100 Constitutional: Well-developed and well-nourished. No distress. HEENT: Normocephalic and atraumatic. Oropharynx is clear and moist. No oropharyngeal exudate. Conjunctivae are normal.  No scleral icterus. Neck: Neck supple. Trachea midline. Cardiovascular: Normal rate, regular rhythm  and intact distal pulses. No M/R/G Pulmonary/chest: Effort normal and breath sounds normal. No wheezing, rales or rhonchi. Abdominal: Soft, nontender, nondistended. Bowel sounds active throughout. There are no masses palpable. No hepatosplenomegaly. Extremities: no clubbing, cyanosis, or edema Lymphadenopathy: No cervical adenopathy noted. Neurological: Alert and oriented to person place and time. Skin: Skin is warm and dry. No rashes noted. Psychiatric: Normal mood and affect. Behavior is normal.  RELEVANT LABS AND IMAGING: CBC    Component Value Date/Time   WBC 8.7 12/27/2010 1130   RBC 5.39 12/27/2010 1130   HGB 16.9 12/27/2010 1130   HCT 45.4 12/27/2010 1130   PLT 177 12/27/2010 1130   MCV 84.2 12/27/2010 1130   MCH 31.4 12/27/2010 1130   MCHC 37.2* 12/27/2010 1130   RDW 12.8 12/27/2010 1130   LYMPHSABS 2.5 07/23/2009 0946   MONOABS 0.6 07/23/2009 0946   EOSABS 0.1 07/23/2009 0946   BASOSABS 0.0 07/23/2009 0946    CMP     Component Value Date/Time   NA 137 07/22/2013 0828   K 3.7 07/22/2013 0828   CL 97 07/22/2013 0828   CO2 25 07/22/2013 0828   GLUCOSE 314* 07/22/2013 0828   BUN 14 07/22/2013 0828   CREATININE 0.68 07/22/2013 0828   CREATININE 1.3 07/26/2011 1145   CALCIUM 9.1 07/22/2013 0828   PROT 7.1 07/22/2013 0828   ALBUMIN 4.2 07/22/2013 0828   AST 35 07/22/2013 0828   ALT 28 07/22/2013 0828   ALKPHOS 72 07/22/2013 0828   BILITOT 0.8 07/22/2013 0828   GFRNONAA >90 12/27/2010 1130   GFRAA >90 12/27/2010 1130   Results for RAINollan, [place] (MRN [id number]) as of 08/19/2013 16:23  Ref. Range 07/23/2009 09:46 12/27/2010 11:30 07/26/2011 11:45 07/22/2013 08:28  Alkaline Phosphatase Latest Range: 39-117 U/L 60 80 66 72  Albumin Latest Range: 3.5-5.2 g/dL 4.1 4.0 4.2 4.2  AST Latest Range: 0-37 U/L 30 50 (H) 52 (H) 35  ALT Latest Range: 0-53 U/L 27 48 45 28  Total Protein Latest Range: 6.0-8.3 g/dL 6.9 7.9 7.7 7.1  Bilirubin, Direct Latest Range: 0.0-0.3 mg/dL 0.1  0.1   Total Bilirubin Latest Range:  0.2-1.2 mg/dL 0.9 0.6 0.9 0.8   US ABDOMEN LIMITED - RIGHT UPPER QUADRANT   COMPARISON:  Ultrasound of the abdomen of 07/23/2009   FINDINGS: Gallbladder:   The gallbladder is visualized and no gallstones are noted. There is no pain over the gallbladder with compression.   Common bile duct:   Diameter: The common bile duct is normal measuring 3.0 mm in diameter.   Liver:   The liver is very inhomogeneous and echogenic consistent with fatty infiltration. An area of decreased echogenicity is noted near the gallbladder which may represent focal sparing, but a hepatic lesion cannot be excluded measuring 4.4 x 3.1 x 5.5 cm. In addition, this area is not visualized in review of the prior ultrasound from 2011. CT of the abdomen with IV contrast  or MRI of the abdomen would be recommended to assess further.   IMPRESSION: 1. Probable fatty infiltration of the liver with focal sparing near the gallbladder. However, a lesion near the gallbladder cannot be excluded as noted above, and either CT or MRI of the abdomen is recommended. 2. No gallstones.     Electronically Signed   By: Dwyane DeePaul  Barry M.D.   On: 08/07/2013 09:34   MRI ABDOMEN WITHOUT AND WITH CONTRAST   TECHNIQUE: Multiplanar multisequence MR imaging of the abdomen was performed both before and after the administration of intravenous contrast.   CONTRAST:  19mL MULTIHANCE GADOBENATE DIMEGLUMINE 529 MG/ML IV SOLN   COMPARISON:  Abdominal ultrasound 08/07/2013   FINDINGS: There is geographic fatty infiltration of the liver with areas of focal fatty sparing around the gallbladder fossa and in the right hepatic lobe posteriorly. No mass or mass effect. No enhancing liver lesions after contrast. The gallbladder is normal. Normal caliber and course of the common bile duct. No intrahepatic biliary dilatation. The portal and hepatic veins are patent.   The pancreas is normal. The spleen is normal in size. No  focal lesions. The adrenal glands and kidneys are normal. No mesenteric or retroperitoneal mass or adenopathy. Small lymph nodes are noted.   The aorta is normal in caliber. The branch vessels are normal. The major venous structures are patent.   The stomach, duodenum, visualized small bowel and visualized colon are unremarkable.   IMPRESSION: 1. Geographic fatty infiltration of the liver with areas of focal fatty sparing accounting for the abnormality on the ultrasound examination. No hepatic lesions. 2. The remainder the abdomen is unremarkable. No inflammatory changes, mass lesions or lymphadenopathy.     Electronically Signed   By: Loralie ChampagneMark  Gallerani M.D.   On: 08/13/2013 08:39    ASSESSMENT/PLAN: 38 yo male with PMH of diabetes, hyperlipidemia, hypertension, GERD, history of elevated liver enzymes who is seen to evaluate recent abnormal liver ultrasound and elevated LFTs along with epigastric abdominal pain.  1.  Epigastric/RUQ pain -- the differential includes gastroduodenitis (alcohol could be the inciting factor for gastritis), biliary dyskinesia, gastroparesis (felt less likely given the lack of nausea and early satiety), and less likely liver capsule pain due to fatty liver/inflammation.  I have recommended prescription strength Nexium 40 mg daily x4 weeks. If pain does not completely resolve upper endoscopy is recommended. HIDA scan could be performed if upper endoscopy negative and symptoms fail to resolve. See #2. Avoid NSAIDs.  H. pylori antibody today  2.  Elevated liver enzymes/fatty liver -- fortunately the MRI did not show any liver lesion, but did show fatty infiltration. He is at risk for nonalcoholic fatty liver disease/NASH given his high blood pressure, hyperlipidemia and diabetes. We also discussed how alcohol can lead to steatosis, elevated liver enzymes and eventually scarring. Also discussed at length how nonalcoholic fatty liver disease is the most common cause of  cirrhosis. Fortunately at this time there is no evidence for advanced liver disease.  I have recommended that he reduce alcohol intake to no more than 2 drinks a day. He is currently drinking bourbon in approximately 4-5 ounces per day.  He may eventually need to stop alcohol altogether, but he does not feel he can realistically stop alcohol altogether, and thus I recommended that he reduce to no more than 2 ounces of alcohol a day.  I would like to repeat his hepatic function panel in 6 weeks. I have recommended vitamin E 800 international units daily.  He will continue to work with primary care to further normalize blood sugar and triglyceride. I recommended a low-fat diet, and he would also benefit from increased exercise. I will check ANA, IgG in bowel hepatitis studies today. If he is not immune to hepatitis A and B., vaccine will be recommended.    3.  HTN/DM2 -- followed by Dr. Lucianne MussKumar.  The patient and his wife requested that I perform an A1c and lipid panel when I check hepatic function in 6 weeks, because Dr. Lucianne MussKumar had planned these labs later this month. I will do so and fax Dr. Lucianne MussKumar the results  4.  Alcohol abuse -- see #2. I have recommended alcohol reduction

## 2013-08-19 NOTE — Patient Instructions (Addendum)
Your physician has requested that you go to the basement for the following lab work before leaving today: Hepatitis A total, hepatitis b antibody, hepatitis b antigen, hepatitis b core total, hepatitis c antibody, ANA, IgG, H pylori  Your physician has requested that you go to the basement for the following lab work on 09/30/13. Please do not eat or drink anything (with the exception of water) 6 hours prior to your test: CMP, INR, A1C, Lipid panel  We have sent the following medications to your pharmacy for you to pick up at your convenience: Nexium 40 mg daily  Please purchase the following medications over the counter and take as directed: Vitamin E 800 iu daily  If your upper (epigastric) abdominal pain persists after 6 weeks, an endoscopy will be recommended.

## 2013-08-20 LAB — HEPATITIS A ANTIBODY, TOTAL: HEP A TOTAL AB: REACTIVE — AB

## 2013-08-20 LAB — HEPATITIS B SURFACE ANTIBODY,QUALITATIVE: Hep B S Ab: NEGATIVE

## 2013-08-20 LAB — HEPATITIS C ANTIBODY: HCV Ab: NEGATIVE

## 2013-08-20 LAB — IGG: IgG (Immunoglobin G), Serum: 1190 mg/dL (ref 650–1600)

## 2013-08-20 LAB — ANA: Anti Nuclear Antibody(ANA): NEGATIVE

## 2013-08-20 LAB — HEPATITIS B SURFACE ANTIGEN: HEP B S AG: NEGATIVE

## 2013-08-20 LAB — HEPATITIS B CORE ANTIBODY, TOTAL: HEP B C TOTAL AB: NONREACTIVE

## 2013-09-06 ENCOUNTER — Other Ambulatory Visit: Payer: 59

## 2013-09-11 ENCOUNTER — Ambulatory Visit: Payer: 59 | Admitting: Endocrinology

## 2013-10-04 ENCOUNTER — Other Ambulatory Visit (INDEPENDENT_AMBULATORY_CARE_PROVIDER_SITE_OTHER): Payer: Managed Care, Other (non HMO)

## 2013-10-04 DIAGNOSIS — K760 Fatty (change of) liver, not elsewhere classified: Secondary | ICD-10-CM

## 2013-10-04 DIAGNOSIS — R748 Abnormal levels of other serum enzymes: Secondary | ICD-10-CM

## 2013-10-04 DIAGNOSIS — K7689 Other specified diseases of liver: Secondary | ICD-10-CM

## 2013-10-04 DIAGNOSIS — R1013 Epigastric pain: Secondary | ICD-10-CM

## 2013-10-04 DIAGNOSIS — E8881 Metabolic syndrome: Secondary | ICD-10-CM

## 2013-10-04 LAB — COMPREHENSIVE METABOLIC PANEL
ALK PHOS: 44 U/L (ref 39–117)
ALT: 34 U/L (ref 0–53)
AST: 39 U/L — ABNORMAL HIGH (ref 0–37)
Albumin: 3.8 g/dL (ref 3.5–5.2)
BUN: 12 mg/dL (ref 6–23)
CALCIUM: 9 mg/dL (ref 8.4–10.5)
CHLORIDE: 102 meq/L (ref 96–112)
CO2: 28 mEq/L (ref 19–32)
Creatinine, Ser: 0.8 mg/dL (ref 0.4–1.5)
GFR: 108.71 mL/min (ref >60.00)
Glucose, Bld: 111 mg/dL — ABNORMAL HIGH (ref 70–99)
POTASSIUM: 3.8 meq/L (ref 3.5–5.1)
Sodium: 138 mEq/L (ref 135–145)
Total Bilirubin: 0.6 mg/dL (ref 0.2–1.2)
Total Protein: 7 g/dL (ref 6.0–8.3)

## 2013-10-04 LAB — LIPID PANEL
Cholesterol: 209 mg/dL — ABNORMAL HIGH (ref 0–200)
HDL: 46.7 mg/dL (ref >39.00)
LDL Cholesterol: 133 mg/dL — ABNORMAL HIGH (ref 0–99)
NONHDL: 162.3
TRIGLYCERIDES: 149 mg/dL (ref 0.0–149.0)
Total CHOL/HDL Ratio: 4
VLDL: 29.8 mg/dL (ref 0.0–40.0)

## 2013-10-04 LAB — PROTIME-INR
INR: 1 ratio (ref 0.8–1.0)
Prothrombin Time: 10.8 s (ref 9.6–13.1)

## 2013-10-04 LAB — HEMOGLOBIN A1C: HEMOGLOBIN A1C: 6.4 % (ref 4.6–6.5)

## 2013-10-10 ENCOUNTER — Encounter: Payer: Self-pay | Admitting: Endocrinology

## 2013-10-10 ENCOUNTER — Other Ambulatory Visit: Payer: Self-pay | Admitting: *Deleted

## 2013-10-10 ENCOUNTER — Ambulatory Visit (INDEPENDENT_AMBULATORY_CARE_PROVIDER_SITE_OTHER): Payer: Managed Care, Other (non HMO) | Admitting: Endocrinology

## 2013-10-10 ENCOUNTER — Other Ambulatory Visit: Payer: Self-pay

## 2013-10-10 VITALS — BP 122/84 | HR 93 | Temp 98.0°F | Resp 16 | Ht 68.0 in | Wt 221.4 lb

## 2013-10-10 DIAGNOSIS — IMO0001 Reserved for inherently not codable concepts without codable children: Secondary | ICD-10-CM

## 2013-10-10 DIAGNOSIS — R945 Abnormal results of liver function studies: Principal | ICD-10-CM

## 2013-10-10 DIAGNOSIS — E663 Overweight: Secondary | ICD-10-CM

## 2013-10-10 DIAGNOSIS — R7989 Other specified abnormal findings of blood chemistry: Secondary | ICD-10-CM

## 2013-10-10 DIAGNOSIS — E785 Hyperlipidemia, unspecified: Secondary | ICD-10-CM

## 2013-10-10 DIAGNOSIS — I10 Essential (primary) hypertension: Secondary | ICD-10-CM

## 2013-10-10 DIAGNOSIS — E1165 Type 2 diabetes mellitus with hyperglycemia: Principal | ICD-10-CM

## 2013-10-10 MED ORDER — CANAGLIFLOZIN 100 MG PO TABS: ORAL_TABLET | ORAL | Status: DC

## 2013-10-10 MED ORDER — ATORVASTATIN CALCIUM 10 MG PO TABS: 10.0000 mg | ORAL_TABLET | Freq: Every day | ORAL | Status: DC

## 2013-10-10 NOTE — Progress Notes (Signed)
Patient ID: Peter Keller, male   DOB: 23-Apr-1975, 38 y.o.   MRN: [id number]    Reason for Appointment:  for Type 2 Diabetes   History of Present Illness:          Diagnosis: Type 2 diabetes mellitus, date of diagnosis: 6/15       Past history: He has had periodic screening for diabetes but none since 2012. Previous A1c tests have been normal and last random glucose was 110 Since about 3/15 he had been having increased thirst and urination and was found to have a glucose of 314 an A1c of 11%  Recent history:  He was started on Janumet XR as initial treatment and is taking the maximum dose without side effects  Because of persistently high readings averaging over 200 he was started on Amaryl also in 6/15 With this his blood sugars are significantly better and A1c is back to normal He does have some relatively high readings occasionally Also has not checked readings after lunch and dinner as directed However has gained back a significant amount of weight Diet: He thinks he is producing high fat meals like pizzas but he is still getting 3-4 alcoholic drinks in the evening daily Also has not done any significant exercise except occasionally       Oral hypoglycemic drugs the patient is taking are: JanumetXR, Amaryl 2 mg at supper       Glucose monitoring:  using One Touch monitor  PREMEAL Breakfast Lunch Dinner  PCS  Overall  Glucose range:  114-163   120, 125  ?   163    Mean/median:  134      138    Glycemic control:  Lab Results  Component Value Date   HGBA1C 6.4 10/04/2013   HGBA1C 11.0* 07/22/2013   HGBA1C 5.6 12/27/2010   Lab Results  Component Value Date   LDLCALC 133* 10/04/2013   CREATININE 0.8 10/04/2013    Self-care: The diet that the patient has been following is: Reduced fat intake  Meals: 3 meals per day.          Exercise: Some walking or running                    His last eye exam was about 2 years ago     Weight history: highest 235   Wt Readings from Last 3  Encounters:  10/10/13 221 lb 6.4 oz (100.426 kg)  08/09/13 208 lb (94.348 kg)  07/26/13 208 lb 6.4 oz (94.53 kg)      Medication List       This list is accurate as of: 10/10/13 11:18 AM.  Always use your most recent med list.               esomeprazole 40 MG capsule  Commonly known as:  NEXIUM  Take 1 capsule (40 mg total) by mouth daily at 12 noon.     fenofibrate 145 MG tablet  Commonly known as:  TRICOR  Take 145 mg by mouth daily.     glimepiride 2 MG tablet  Commonly known as:  AMARYL  Take before dinner     glucose blood test strip  Commonly known as:  ONETOUCH VERIO  Use as instructed to check blood sugar once per day dx code 250.02     losartan-hydrochlorothiazide 100-25 MG per tablet  Commonly known as:  HYZAAR  Take 1 tablet by mouth daily.     ONETOUCH DELICA LANCETS 33G Misc  Use to obtain a blood specimen once per day     SitaGLIPtin-MetFORMIN HCl 50-1000 MG Tb24  Commonly known as:  JANUMET XR  Take 50 mg by mouth 2 (two) times daily.     Vitamin D 400 UNITS capsule  Take 500 Units by mouth daily.     Vitamin E 400 UNITS Tabs  Take 2 tablets (800 Units total) by mouth daily.        Allergies: No Known Allergies  Past Medical History  Diagnosis Date  . Hypertension   . Obesity   . Diabetes   . Hyperlipidemia   . Hepatic steatosis   . Sinus tachycardia   . Alcohol dependence   . GERD (gastroesophageal reflux disease)     Past Surgical History  Procedure Laterality Date  . None      Family History  Problem Relation Age of Onset  . Colon cancer Neg Hx   . Hypertension Mother   . Heart disease Paternal Uncle   . Heart attack Paternal Grandmother   . Diabetes Mother   . Esophageal cancer Neg Hx   . Stomach cancer Neg Hx     Social History:  reports that he has never smoked. He has never used smokeless tobacco. He reports that he drinks alcohol. He reports that he does not use illicit drugs. Recently has cut back his intake of  wine and beer but previously drinking 3-4 glasses of wine a day  Review of Systems       Lipids: on Zocor 20 mg in 2012 for mild hypercholesterolemia, LDL 138. He took this for about a year only Has not had high triglycerides in the past With starting fenofibrate his triglycerides are below 150 but his LDL is higher       Lab Results  Component Value Date   CHOL 209* 10/04/2013   HDL 46.70 10/04/2013   LDLCALC 133* 10/04/2013   LDLDIRECT 119* 07/22/2013   TRIG 149.0 10/04/2013   CHOLHDL 4 10/04/2013        Thyroid:  No  unusual fatigue.No palpitations but he says that his heart rate is usually past and has been for sometime  Lab Results  Component Value Date   TSH 2.055 12/27/2010       The blood pressure has been treated with medications for 3-4 years, now taking Hyzaar.  Overall has had high blood pressure for 6-7 years     He has had episodes of abdominal pain on and off for several years.  Pain occurs in the right upper abdomen radiating to the back.  Usually occurs after any meal and may last up to 2 hours. No associated nausea or vomiting. Recently his episodes are less frequent and has been prescribed  Nexium by gastroenterologist Ultrasound shows fatty liver with indeterminate area near the gallbladder   Physical Examination:  BP 122/84  Pulse 93  Temp(Src) 98 F (36.7 C)  Resp 16  Ht  (1.727 m)  Wt 221 lb 6.4 oz (100.426 kg)  BMI 33.67 kg/m2  SpO2 97%      ASSESSMENT:  1. Diabetes type 2, uncontrolled with obesity  He has significant improvement in his diabetes control with using Amaryl in addition to his Janumet XR  He is quite concerned about his weight gain which may be partly related to improved blood sugar control and use of Amaryl as well as not restricting overall caloric intake and inadequate exercise Although his meals are less high-fat he is getting  significant amount of alcohol in the evenings Fasting blood sugars are mildly increased  still Has not checked postprandial readings as directed  2. Hyperlipidemia: Triglycerides are back to normal but his LDL is significantly high considering his ethnic status and having diabetes and hypertension as a risk factor  3. Hypertension: This is fairly well controlled although high normal and may improve with adding Invokana, will also need to watch his renal function with starting Invokana  PLAN:  Start Invokana 100 mg in the mornings instead of Amaryl: Continue maximum dose Janumet XR. Discussed action of SGLT 2 drugs on lowering glucose by decreasing kidney absorption of glucose, benefits of weight loss and lower blood pressure, possible side effects including candidiasis and dosage regimen   He can stop the Amaryl when the blood sugar is below 100 in the morning  Continue to improve diet with reducing carbohydrates, fats and alcohol  Increase exercise  More blood sugars after lunch or dinner  Will review his readings on the next visit, he does need to check consistently after meals also especially after dinner   Start Lipitor 10 mg daily, may be able to stop fenofibrate  Patient Instructions  Please check blood sugars at least half the time about 2 hours after any meal and 3 times per week on waking up.  Please bring blood sugar monitor to each visit  Walking 20 min DAILY  Reduce alcohol to 2 drinks or less daily  Lipitor  daily  Invokana before breakfast, when sugar < 100 stop Glimeperide     Lisbeth Puller 10/10/2013, 11:18 AM   Note: This office note was prepared with Insurance underwriter. Any transcriptional errors that result from this process are unintentional.

## 2013-10-10 NOTE — Patient Instructions (Signed)
Please check blood sugars at least half the time about 2 hours after any meal and 3 times per week on waking up.  Please bring blood sugar monitor to each visit  Walking 20 min DAILY  Reduce alcohol to 2 drinks or less daily  Lipitor  daily  Invokana before breakfast, when sugar < 100 stop Glimeperide

## 2013-10-14 ENCOUNTER — Ambulatory Visit: Payer: Managed Care, Other (non HMO) | Admitting: Cardiovascular Disease

## 2013-10-28 ENCOUNTER — Ambulatory Visit (INDEPENDENT_AMBULATORY_CARE_PROVIDER_SITE_OTHER): Payer: Managed Care, Other (non HMO) | Admitting: Internal Medicine

## 2013-10-28 ENCOUNTER — Encounter: Payer: Self-pay | Admitting: Internal Medicine

## 2013-10-28 VITALS — BP 138/80 | HR 98 | Temp 98.2°F | Resp 18 | Ht 69.0 in | Wt 222.1 lb

## 2013-10-28 DIAGNOSIS — E119 Type 2 diabetes mellitus without complications: Secondary | ICD-10-CM

## 2013-10-28 DIAGNOSIS — I10 Essential (primary) hypertension: Secondary | ICD-10-CM

## 2013-10-28 DIAGNOSIS — Z23 Encounter for immunization: Secondary | ICD-10-CM

## 2013-10-28 DIAGNOSIS — E1165 Type 2 diabetes mellitus with hyperglycemia: Secondary | ICD-10-CM

## 2013-10-28 DIAGNOSIS — E663 Overweight: Secondary | ICD-10-CM

## 2013-10-28 DIAGNOSIS — K219 Gastro-esophageal reflux disease without esophagitis: Secondary | ICD-10-CM

## 2013-10-28 DIAGNOSIS — F1020 Alcohol dependence, uncomplicated: Secondary | ICD-10-CM

## 2013-10-28 DIAGNOSIS — IMO0001 Reserved for inherently not codable concepts without codable children: Secondary | ICD-10-CM

## 2013-10-28 DIAGNOSIS — E785 Hyperlipidemia, unspecified: Secondary | ICD-10-CM

## 2013-10-28 NOTE — Assessment & Plan Note (Signed)
He did have history of elevated triglycerides and is on tricor at this time. Will check labs at next visit.

## 2013-10-28 NOTE — Progress Notes (Signed)
Pre visit review using our clinic review tool, if applicable. No additional management support is needed unless otherwise documented below in the visit note. 

## 2013-10-28 NOTE — Assessment & Plan Note (Signed)
Well controlled on losartan/hctz 100/25. Will check BMP at next visit.

## 2013-10-28 NOTE — Assessment & Plan Note (Signed)
He is still having difficulty with drinking alcohol and has substituted beer for wine given his stomach problems.

## 2013-10-28 NOTE — Assessment & Plan Note (Signed)
He is doing well on nexium and discussed keeping him on it for 6 months then trying to reduce dosing.

## 2013-10-28 NOTE — Assessment & Plan Note (Signed)
Recent HgA1c was 6.4 and he is currently on janumet and invokana. He wants to know about dosing of invokana and will look into titration. Will recheck HgA1c at next visit in 3 months. He would like to stop going to endocrinology and will assume care of his diabetes.

## 2013-10-28 NOTE — Progress Notes (Signed)
   Subjective:    Patient ID: Peter Keller, male    DOB: Jan 22, 1976, 38 y.o.   MRN: [id number]  HPI The patient is new to this office and is here to establish care. He is a 38 YO man (likes to be called Peter Keller) with history of alcohol dependence, DM type 2, HTN, gerd, hyperlipidemia. He is not having any problems today. He is trying to work on changing his diet to get his sugars under control. He has not worked on exercise but would like to work on that later as he is making only so many changes as one time. He denies chest pain, SOB. He has history of abdominal pain but this is under much better control since starting nexium. He denies diarrhea or constipation or nausea. He denies swelling in his legs. He states that he has been working on his consumption of wine but is now drinking beer instead.  Review of Systems  Constitutional: Negative for fever, activity change, appetite change and fatigue.  HENT: Negative.   Respiratory: Negative for cough, chest tightness, shortness of breath and wheezing.   Cardiovascular: Negative for chest pain, palpitations and leg swelling.  Gastrointestinal: Negative for abdominal pain, diarrhea, constipation and abdominal distention.  Endocrine: Negative.   Genitourinary: Negative.   Musculoskeletal: Negative for arthralgias, gait problem and myalgias.  Skin: Negative for wound.  Neurological: Negative for dizziness, weakness, numbness and headaches.       Objective:   Physical Exam  Vitals reviewed. Constitutional: He is oriented to person, place, and time. He appears well-developed and well-nourished.  Overweight  HENT:  Head: Normocephalic and atraumatic.  Eyes: EOM are normal.  Neck: Normal range of motion.  Cardiovascular: Normal rate and regular rhythm.   No murmur heard. Pulmonary/Chest: Effort normal and breath sounds normal. No respiratory distress. He has no wheezes. He has no rales.  Abdominal: Soft. Bowel sounds are normal. He exhibits no  distension. There is no tenderness. There is no rebound.  Neurological: He is alert and oriented to person, place, and time.  Skin: Skin is warm and dry.  See foot exam      Assessment & Plan:

## 2013-10-28 NOTE — Patient Instructions (Signed)
We will have you keep working on exercise and diet until you come back in 3 months to check on the sugars.   Your sugars are doing much better and we want to keep them under control.   If you have any problems or need refills before your next appointment please feel free to call the office.   Diabetes and Exercise Exercising regularly is important. It is not just about losing weight. It has many health benefits, such as:  Improving your overall fitness, flexibility, and endurance.  Increasing your bone density.  Helping with weight control.  Decreasing your body fat.  Increasing your muscle strength.  Reducing stress and tension.  Improving your overall health. People with diabetes who exercise gain additional benefits because exercise:  Reduces appetite.  Improves the body's use of blood sugar (glucose).  Helps lower or control blood glucose.  Decreases blood pressure.  Helps control blood lipids (such as cholesterol and triglycerides).  Improves the body's use of the hormone insulin by:  Increasing the body's insulin sensitivity.  Reducing the body's insulin needs.  Decreases the risk for heart disease because exercising:  Lowers cholesterol and triglycerides levels.  Increases the levels of good cholesterol (such as high-density lipoproteins [HDL]) in the body.  Lowers blood glucose levels. YOUR ACTIVITY PLAN  Choose an activity that you enjoy and set realistic goals. Your health care provider or diabetes educator can help you make an activity plan that works for you. Exercise regularly as directed by your health care provider. This includes:  Performing resistance training twice a week such as push-ups, sit-ups, lifting weights, or using resistance bands.  Performing 150 minutes of cardio exercises each week such as walking, running, or playing sports.  Staying active and spending no more than 90 minutes at one time being inactive. Even short bursts of  exercise are good for you. Three 10-minute sessions spread throughout the day are just as beneficial as a single 30-minute session. Some exercise ideas include:  Taking the dog for a walk.  Taking the stairs instead of the elevator.  Dancing to your favorite song.  Doing an exercise video.  Doing your favorite exercise with a friend. RECOMMENDATIONS FOR EXERCISING WITH TYPE 1 OR TYPE 2 DIABETES   Check your blood glucose before exercising. If blood glucose levels are greater than 240 mg/dL, check for urine ketones. Do not exercise if ketones are present.  Avoid injecting insulin into areas of the body that are going to be exercised. For example, avoid injecting insulin into:  The arms when playing tennis.  The legs when jogging.  Keep a record of:  Food intake before and after you exercise.  Expected peak times of insulin action.  Blood glucose levels before and after you exercise.  The type and amount of exercise you have done.  Review your records with your health care provider. Your health care provider will help you to develop guidelines for adjusting food intake and insulin amounts before and after exercising.  If you take insulin or oral hypoglycemic agents, watch for signs and symptoms of hypoglycemia. They include:  Dizziness.  Shaking.  Sweating.  Chills.  Confusion.  Drink plenty of water while you exercise to prevent dehydration or heat stroke. Body water is lost during exercise and must be replaced.  Talk to your health care provider before starting an exercise program to make sure it is safe for you. Remember, almost any type of activity is better than none. Document Released: 04/23/2003 Document  Revised: 06/17/2013 Document Reviewed: 07/10/2012 [facility] Patient Information 2015 [place], [place]. This information is not intended to replace advice given to you by your health care provider. Make sure you discuss any questions you have with your health care  provider.

## 2013-10-28 NOTE — Assessment & Plan Note (Signed)
He is working on his diet at this time and is working his way toward exercising. Talked with him about walking and he does have some hills in his neighborhood.

## 2013-11-02 ENCOUNTER — Other Ambulatory Visit: Payer: Self-pay | Admitting: Internal Medicine

## 2013-11-02 ENCOUNTER — Other Ambulatory Visit: Payer: Self-pay | Admitting: Endocrinology

## 2013-12-06 ENCOUNTER — Other Ambulatory Visit: Payer: Self-pay | Admitting: Endocrinology

## 2013-12-06 ENCOUNTER — Other Ambulatory Visit (INDEPENDENT_AMBULATORY_CARE_PROVIDER_SITE_OTHER): Payer: Managed Care, Other (non HMO)

## 2013-12-06 ENCOUNTER — Other Ambulatory Visit: Payer: Managed Care, Other (non HMO)

## 2013-12-06 ENCOUNTER — Other Ambulatory Visit: Payer: Self-pay | Admitting: *Deleted

## 2013-12-06 DIAGNOSIS — E1165 Type 2 diabetes mellitus with hyperglycemia: Secondary | ICD-10-CM

## 2013-12-06 DIAGNOSIS — R945 Abnormal results of liver function studies: Secondary | ICD-10-CM

## 2013-12-06 DIAGNOSIS — IMO0002 Reserved for concepts with insufficient information to code with codable children: Secondary | ICD-10-CM

## 2013-12-06 DIAGNOSIS — E785 Hyperlipidemia, unspecified: Secondary | ICD-10-CM

## 2013-12-06 DIAGNOSIS — R7989 Other specified abnormal findings of blood chemistry: Secondary | ICD-10-CM

## 2013-12-06 LAB — COMPREHENSIVE METABOLIC PANEL
ALT: 44 U/L (ref 0–53)
AST: 45 U/L — AB (ref 0–37)
Albumin: 3.8 g/dL (ref 3.5–5.2)
Alkaline Phosphatase: 49 U/L (ref 39–117)
BUN: 19 mg/dL (ref 6–23)
CHLORIDE: 102 meq/L (ref 96–112)
CO2: 24 meq/L (ref 19–32)
CREATININE: 0.9 mg/dL (ref 0.4–1.5)
Calcium: 9.4 mg/dL (ref 8.4–10.5)
GFR: 97.79 mL/min (ref >60.00)
Glucose, Bld: 129 mg/dL — ABNORMAL HIGH (ref 70–99)
Potassium: 3.8 mEq/L (ref 3.5–5.1)
SODIUM: 137 meq/L (ref 135–145)
TOTAL PROTEIN: 8 g/dL (ref 6.0–8.3)
Total Bilirubin: 0.9 mg/dL (ref 0.2–1.2)

## 2013-12-06 LAB — HEMOGLOBIN A1C: HEMOGLOBIN A1C: 5.7 % (ref 4.6–6.5)

## 2013-12-06 LAB — LIPID PANEL
Cholesterol: 180 mg/dL (ref 0–200)
HDL: 39.6 mg/dL (ref >39.00)
LDL Cholesterol: 110 mg/dL — ABNORMAL HIGH (ref 0–99)
NonHDL: 140.4
TRIGLYCERIDES: 154 mg/dL — AB (ref 0.0–149.0)
Total CHOL/HDL Ratio: 5
VLDL: 30.8 mg/dL (ref 0.0–40.0)

## 2013-12-06 LAB — PROTIME-INR
INR: 1.1 ratio — ABNORMAL HIGH (ref 0.8–1.0)
PROTHROMBIN TIME: 12 s (ref 9.6–13.1)

## 2013-12-06 LAB — HEPATIC FUNCTION PANEL
ALT: 44 U/L (ref 0–53)
AST: 45 U/L — ABNORMAL HIGH (ref 0–37)
Albumin: 3.8 g/dL (ref 3.5–5.2)
Alkaline Phosphatase: 49 U/L (ref 39–117)
Bilirubin, Direct: 0.1 mg/dL (ref 0.0–0.3)
TOTAL PROTEIN: 8 g/dL (ref 6.0–8.3)
Total Bilirubin: 0.9 mg/dL (ref 0.2–1.2)

## 2013-12-06 LAB — GLUCOSE, RANDOM: GLUCOSE: 129 mg/dL — AB (ref 70–99)

## 2013-12-06 LAB — LDL CHOLESTEROL, DIRECT: LDL DIRECT: 122.9 mg/dL

## 2013-12-06 LAB — TSH: TSH: 1.944 u[IU]/mL (ref 0.350–4.500)

## 2013-12-07 LAB — VITAMIN D 25 HYDROXY (VIT D DEFICIENCY, FRACTURES): Vit D, 25-Hydroxy: 52 ng/mL (ref 30–89)

## 2013-12-08 LAB — FRUCTOSAMINE: Fructosamine: 263 umol/L (ref 190–270)

## 2013-12-12 ENCOUNTER — Ambulatory Visit (INDEPENDENT_AMBULATORY_CARE_PROVIDER_SITE_OTHER): Payer: Managed Care, Other (non HMO) | Admitting: Endocrinology

## 2013-12-12 ENCOUNTER — Encounter: Payer: Self-pay | Admitting: Endocrinology

## 2013-12-12 VITALS — BP 150/80 | HR 108 | Temp 98.3°F | Resp 14 | Ht 69.0 in | Wt 226.4 lb

## 2013-12-12 DIAGNOSIS — I10 Essential (primary) hypertension: Secondary | ICD-10-CM

## 2013-12-12 DIAGNOSIS — IMO0002 Reserved for concepts with insufficient information to code with codable children: Secondary | ICD-10-CM

## 2013-12-12 DIAGNOSIS — E1165 Type 2 diabetes mellitus with hyperglycemia: Secondary | ICD-10-CM

## 2013-12-12 DIAGNOSIS — E785 Hyperlipidemia, unspecified: Secondary | ICD-10-CM

## 2013-12-12 DIAGNOSIS — E669 Obesity, unspecified: Secondary | ICD-10-CM

## 2013-12-12 MED ORDER — DULAGLUTIDE 0.75 MG/0.5ML ~~LOC~~ SOAJ: 0.7500 mg | SUBCUTANEOUS | Status: DC

## 2013-12-12 NOTE — Patient Instructions (Addendum)
Trulicity weekly   Reduce alcohol to 2 drinks daily  Lipitor 20mg  daily  Please check blood sugars at least half the time about 2 hours after any meal and 3 times per week on waking up. Please bring blood sugar monitor to each visit

## 2013-12-12 NOTE — Progress Notes (Signed)
Patient ID: [name], male   DOB: 01/08/1976, 38 y.o.   MRN: [id number]    Reason for Appointment:  for Type 2 Diabetes   History of Present Illness:          Diagnosis: Type 2 diabetes mellitus, date of diagnosis: 6/15       Past history: He has had periodic screening for diabetes but none since 2012. Previous A1c tests have been normal and last random glucose was 110 Since about 3/15 he had been having increased thirst and urination and was found to have a glucose of 314 an A1c of 11% He was started on Janumet XR as initial treatment and this was titrated to the maximum dose  Recent history:  Because of persistently high readings averaging over 200 he was started on Amaryl also in 6/15 With this his blood sugars were significantly better and A1c was back to normal However he was concerned about regaining most of his weight loss and need to lose weight long-term His Amaryl was changed to Invokana 100 mg daily in 8/15 Although his A1c has improved further his glucose readings at home are looking about the same He still tends to have some relatively high readings occasionally Also has not checked readings after lunch and dinner as directed and mostly in the morning, unable to download his meter today However despite using Invokana he has gained some more weight He says he has been trying to go to the exercise program twice a week recently and is generally active during the day  Diet: Not consistent and occasionally still getting fat meals like pizza; also may be having difficulty controlling portions He is still getting  4-5 alcoholic drinks in the evening daily       Oral hypoglycemic drugs the patient is taking are: JanumetXR, Invokana 100 mg daily   Glucose monitoring:  using One Touch monitor Am 115-163 After lunch 120 today  Glycemic control:  Lab Results  Component Value Date   HGBA1C 5.7 12/06/2013   HGBA1C 6.4 10/04/2013   HGBA1C 11.0* 07/22/2013   Lab Results   Component Value Date   LDLCALC 110* 12/06/2013   CREATININE 0.9 12/06/2013    Self-care: The diet that the patient has been following is: Variable  Meals: 3 meals per day.          Exercise: Going to the gym twice a week             His last eye exam was about 2 years ago     Weight history: highest 235   Wt Readings from Last 3 Encounters:  12/12/13 226 lb 6.4 oz (102.694 kg)  10/28/13 222 lb 1.9 oz (100.753 kg)  10/10/13 221 lb 6.4 oz (100.426 kg)      Medication List       This list is accurate as of: 12/12/13  1:51 PM.  Always use your most recent med list.               atorvastatin 10 MG tablet  Commonly known as:  LIPITOR  Take 1 tablet (10 mg total) by mouth daily.     canagliflozin 100 MG Tabs tablet  Commonly known as:  INVOKANA  Take 1 tablet daily     Dulaglutide 0.75 MG/0.5ML Sopn  Commonly known as:  TRULICITY  Inject 0.75 mg into the skin once a week.     esomeprazole 40 MG capsule  Commonly known as:  NEXIUM  TAKE 1 CAPSULE (40  MG TOTAL) BY MOUTH DAILY AT 12 NOON.     fenofibrate 145 MG tablet  Commonly known as:  TRICOR  Take 145 mg by mouth daily.     glimepiride 2 MG tablet  Commonly known as:  AMARYL  Take before dinner     losartan-hydrochlorothiazide 100-25 MG per tablet  Commonly known as:  HYZAAR  Take 1 tablet by mouth daily.     ONETOUCH DELICA LANCETS 33G Misc  Use to obtain a blood specimen once per day     ONETOUCH VERIO test strip  Generic drug:  glucose blood  USE AS INSTRUCTED TO CHECK BLOOD SUGAR ONCE DAILY     SitaGLIPtin-MetFORMIN HCl 50-1000 MG Tb24  Commonly known as:  JANUMET XR  Take 2 tablets once a day     Vitamin D 400 UNITS capsule  Take 500 Units by mouth daily.     Vitamin E 400 UNITS Tabs  Take 2 tablets (800 Units total) by mouth daily.        Allergies: No Known Allergies  Past Medical History  Diagnosis Date  . Hypertension   . Obesity   . Diabetes   . Hyperlipidemia   . Hepatic  steatosis   . Sinus tachycardia   . Alcohol dependence   . GERD (gastroesophageal reflux disease)     Past Surgical History  Procedure Laterality Date  . None      Family History  Problem Relation Age of Onset  . Colon cancer Neg Hx   . Hypertension Mother   . Heart disease Paternal Uncle   . Heart attack Paternal Grandmother   . Diabetes Mother   . Esophageal cancer Neg Hx   . Stomach cancer Neg Hx     Social History:  reports that he has never smoked. He has never used smokeless tobacco. He reports that he drinks alcohol. He reports that he does not use illicit drugs. Recently has cut back his intake of wine and beer but previously drinking 3-4 glasses of wine a day  Review of Systems       Lipids: on Zocor 20 mg previously in 2012 for mild hypercholesterolemia, LDL was 138. He took this for about a year only Has not had high triglycerides in the past With starting fenofibrate his triglycerides were improved but LDL was relatively high He was started on Lipitor 10 mg daily in addition to resume fenofibrate but he has stopped the fenofibrate on his own LDL is still above 100 Diet can be better       Lab Results  Component Value Date   CHOL 180 12/06/2013   HDL 39.60 12/06/2013   LDLCALC 110* 12/06/2013   LDLDIRECT 122.9 12/06/2013   TRIG 154.0* 12/06/2013   CHOLHDL 5 12/06/2013       Thyroid:  No  unusual fatigue.No palpitations but he says that his heart rate is usually fast and has been for sometime  Lab Results  Component Value Date   TSH 1.944 12/06/2013       The blood pressure has been treated with medications for 3-4 years, now taking Hyzaar.  His blood pressure is relatively higher today, was better with his PCP  Recently his episodes of abdominal pain are less frequent and has been prescribed  Nexium by gastroenterologist Ultrasound shows fatty liver with indeterminate area near the gallbladder   Physical Examination:  BP 152/100  Pulse 108   Temp(Src) 98.3 F (36.8 C)  Resp 14  Ht 5\' 9"  (  1.753 m)  Wt 226 lb 6.4 oz (102.694 kg)  BMI 33.42 kg/m2  SpO2 94%  Repeat blood pressure 150/80    ASSESSMENT:  1. Diabetes type 2, uncontrolled with obesity  He has about the same improvement in his diabetes control with using Invokana insert of Amaryl in addition to his Janumet XR However he is still tending to gain weight and has difficulty controlling his caloric intake both from food and alcohol Although he has started exercising he is doing this only about twice a week He is interested in a medication that would help him decrease appetite and loose weight  Discussed with the patient the nature of GLP-1 drugs, the action on various organ systems, how they benefit blood glucose control, as well as the benefit of weight loss and  increase satiety . Explained possible side effects especially nausea and vomiting; discussed safety information in package insert. May need prior authorization for this  Fasting blood sugars are increased at times Has not checked postprandial readings as directed  2. Hyperlipidemia: Triglycerides are back to normal but his LDL is still high even with taking Lipitor 10 mg. LDL target should be at least less than sign 100  3. Hypertension: This is not consistently well controlled and discussed that reducing alcohol intake would be important for better control  PLAN:  Continue Invokana 100 mg in the mornings Start Trulicity instead of Amaryl: Demonstrated the medication injection device and injection technique to the patient. Discussed injection sites and titration of Trulicity starting with 0.75 mg once a week for 2 weeks and then increasing to 1.5 mg if no symptoms of nausea. Patient brochure on Trulicity and co-pay card given Continue maximum dose Janumet XR but will switch this to metformin ER on his next visit if Trulicity ineffective  Continue to improve diet with reducing carbohydrates, fats and  alcohol  Will review his readings on the next visit, he does need to check consistently after meals also especially after dinner   Start Lipitor 20 mg daily   Continue to monitor blood pressure and consider additional medication if still persistently high  Patient Instructions  Trulicity weekly   Reduce alcohol to 2 drinks daily  Lipitor 20mg  daily   Counseling time over 50% of today's 25 minute visit  Peter Keller 12/12/2013, 1:51 PM   Note: This office note was prepared with Insurance underwriter. Any transcriptional errors that result from this process are unintentional.

## 2013-12-31 ENCOUNTER — Other Ambulatory Visit: Payer: Self-pay | Admitting: Internal Medicine

## 2013-12-31 ENCOUNTER — Other Ambulatory Visit: Payer: Self-pay | Admitting: Endocrinology

## 2013-12-31 MED ORDER — ATORVASTATIN CALCIUM 20 MG PO TABS: 20.0000 mg | ORAL_TABLET | Freq: Every day | ORAL | Status: DC

## 2014-01-15 ENCOUNTER — Ambulatory Visit (INDEPENDENT_AMBULATORY_CARE_PROVIDER_SITE_OTHER): Payer: Managed Care, Other (non HMO) | Admitting: Endocrinology

## 2014-01-15 ENCOUNTER — Encounter: Payer: Self-pay | Admitting: Endocrinology

## 2014-01-15 VITALS — BP 158/108 | HR 94 | Temp 98.2°F | Resp 16 | Ht 69.0 in | Wt 220.8 lb

## 2014-01-15 DIAGNOSIS — E119 Type 2 diabetes mellitus without complications: Secondary | ICD-10-CM

## 2014-01-15 DIAGNOSIS — E785 Hyperlipidemia, unspecified: Secondary | ICD-10-CM

## 2014-01-15 MED ORDER — DULAGLUTIDE 1.5 MG/0.5ML ~~LOC~~ SOAJ: 1.5000 mg | SUBCUTANEOUS | Status: DC

## 2014-01-15 NOTE — Progress Notes (Signed)
Patient ID: Peter FileSukhjeet Keller, male   DOB: 03/19/1975, 38 y.o.   MRN: [id number]    Reason for Appointment:  for Type 2 Diabetes   History of Present Illness:          Diagnosis: Type 2 diabetes mellitus, date of diagnosis: 6/15       Past history: He has had periodic screening for diabetes but none since 2012. Previous A1c tests have been normal and last random glucose was 110 Since about 3/15 he had been having increased thirst and urination and was found to have a glucose of 314 an A1c of 11% He was started on Janumet XR as initial treatment and this was titrated to the maximum dose Because of persistently high readings averaging over 200 he was started on Amaryl also in 6/15 With Amaryl his blood sugars were significantly better and A1c was back to normal  Recent history:  His Amaryl was changed to Invokana 100 mg daily in 8/15 especially because of weight gain However this did not improve his weight and blood sugars were not very different He had been checking his blood sugars sporadically and again has not checked them regularly Blood sugars are higher than expected for his A1c He is again concerned about difficulty losing weight even though recently he thinks he is watching his diet better Also has eliminated alcohol in the last few days He was started on Trulicity 0.75 mg in late October.  With this he has had better satiety and he has had fairly good blood sugar control He was told to continue his Janumet but he has stopped this along with stopping the Amaryl Blood sugars are not any higher; highest glucose is 153 after lunch but he has not done any fasting readings; recent blood sugar range 104-153       Oral hypoglycemic drugs the patient is taking are:  Invokana 100 mg daily   Glucose monitoring:  using One Touch monitor Results as above  Glycemic control:  Lab Results  Component Value Date   HGBA1C 5.7 12/06/2013   HGBA1C 6.4 10/04/2013   HGBA1C 11.0* 07/22/2013   Lab  Results  Component Value Date   LDLCALC 110* 12/06/2013   CREATININE 0.9 12/06/2013    Self-care: The diet that the patient has been following is: Variable  Meals: 3 meals per day.          Exercise: not going to the gym          His last eye exam was about 2 years ago     Weight history: highest 235   Wt Readings from Last 3 Encounters:  01/15/14 220 lb 12.8 oz (100.154 kg)  12/12/13 226 lb 6.4 oz (102.694 kg)  10/28/13 222 lb 1.9 oz (100.753 kg)      Medication List       This list is accurate as of: 01/15/14 10:44 AM.  Always use your most recent med list.               atorvastatin 10 MG tablet  Commonly known as:  LIPITOR  Take 1 tablet (10 mg total) by mouth daily.     atorvastatin 20 MG tablet  Commonly known as:  LIPITOR  Take 1 tablet (20 mg total) by mouth daily.     canagliflozin 100 MG Tabs tablet  Commonly known as:  INVOKANA  Take 1 tablet daily     esomeprazole 40 MG capsule  Commonly known as:  NEXIUM  TAKE 1  CAPSULE (40 MG TOTAL) BY MOUTH DAILY AT 12 NOON.     fenofibrate 145 MG tablet  Commonly known as:  TRICOR  Take 145 mg by mouth daily.     glimepiride 2 MG tablet  Commonly known as:  AMARYL  Take before dinner     losartan-hydrochlorothiazide 100-25 MG per tablet  Commonly known as:  HYZAAR  Take 1 tablet by mouth daily.     ONETOUCH DELICA LANCETS 33G Misc  Use to obtain a blood specimen once per day     ONETOUCH VERIO test strip  Generic drug:  glucose blood  USE AS INSTRUCTED TO CHECK BLOOD SUGAR ONCE DAILY     SitaGLIPtin-MetFORMIN HCl 50-1000 MG Tb24  Commonly known as:  JANUMET XR  Take 2 tablets once a day     TRULICITY 0.75 MG/0.5ML Sopn  Generic drug:  Dulaglutide  INJECT 0.75 MG INTO THE SKIN ONCE A WEEK.     Vitamin D 400 UNITS capsule  Take 500 Units by mouth daily.     Vitamin E 400 UNITS Tabs  Take 2 tablets (800 Units total) by mouth daily.        Allergies: No Known Allergies  Past Medical  History  Diagnosis Date  . Hypertension   . Obesity   . Diabetes   . Hyperlipidemia   . Hepatic steatosis   . Sinus tachycardia   . Alcohol dependence   . GERD (gastroesophageal reflux disease)     Past Surgical History  Procedure Laterality Date  . None      Family History  Problem Relation Age of Onset  . Colon cancer Neg Hx   . Hypertension Mother   . Heart disease Paternal Uncle   . Heart attack Paternal Grandmother   . Diabetes Mother   . Esophageal cancer Neg Hx   . Stomach cancer Neg Hx     Social History:  reports that he has never smoked. He has never used smokeless tobacco. He reports that he drinks alcohol. He reports that he does not use illicit drugs. Recently has cut back his intake of wine and beer but previously drinking 3-4 glasses of wine a day  Review of Systems       Lipids: on Zocor 20 mg previously in 2012 for mild hypercholesterolemia, LDL was 138. He took this for about a year only Has not had high triglycerides in the past With starting fenofibrate his triglycerides were improved but LDL was relatively high He was started on Lipitor 10 mg daily in addition to resume fenofibrate but he has stopped the fenofibrate on his own Lipitor has been increased to 20 mg        Lab Results  Component Value Date   CHOL 180 12/06/2013   HDL 39.60 12/06/2013   LDLCALC 110* 12/06/2013   LDLDIRECT 122.9 12/06/2013   TRIG 154.0* 12/06/2013   CHOLHDL 5 12/06/2013       Thyroid:  No  unusual fatigue.No palpitations but he says that his heart rate is usually fast and has been for sometime  Lab Results  Component Value Date   TSH 1.944 12/06/2013       The blood pressure has been treated with medications for 3-4 years, now taking Hyzaar.  His blood pressure is again higher today, was better with his PCP Tends to have white coat syndrome  Recently his episodes of abdominal pain are rare and mostly when he is not taking his Nexium Ultrasound shows fatty  liver with indeterminate area near the gallbladder   Physical Examination:  BP 158/108 mmHg  Pulse 94  Temp(Src) 98.2 F (36.8 C)  Resp 16  Ht 5\' 9"  (1.753 m)  Wt 220 lb 12.8 oz (100.154 kg)  BMI 32.59 kg/m2  SpO2 96%     ASSESSMENT:  1. Diabetes type 2, uncontrolled with obesity  He has good results with starting Trulicity 0.75 mg and no side effects He feels that it helps him watch his diet and he has started losing weight We will with stopping his Janumet his blood sugars have not increased, currently also on Invokana However he has not checked his blood sugars Also has not exercised as before  2. Hyperlipidemia: We will recheck lipids on the next visit  PLAN:  Continue Invokana 100 mg in the mornings  Start Trulicity 1.5 mg when he runs out of the current prescription; meanwhile he can take Janumet once a day  More regular blood sugar monitoring  Restart exercise at least 3 days a week  Keirstin Musil 01/15/2014, 10:44 AM   Note: This office note was prepared with Insurance underwriter. Any transcriptional errors that result from this process are unintentional.

## 2014-01-15 NOTE — Patient Instructions (Signed)
Start exercise again 3-4x per week  Take Janumet 1/day till Trulicity increased  Please check blood sugars at least half the time about 2 hours after any meal and 2 times per week on waking up. Please bring blood sugar monitor to each visit

## 2014-01-23 ENCOUNTER — Ambulatory Visit: Payer: Managed Care, Other (non HMO) | Admitting: Internal Medicine

## 2014-01-23 ENCOUNTER — Encounter: Payer: Self-pay | Admitting: Internal Medicine

## 2014-02-05 ENCOUNTER — Other Ambulatory Visit: Payer: Self-pay | Admitting: Internal Medicine

## 2014-02-05 ENCOUNTER — Other Ambulatory Visit: Payer: Self-pay | Admitting: Endocrinology

## 2014-03-04 ENCOUNTER — Other Ambulatory Visit: Payer: Self-pay | Admitting: Internal Medicine

## 2014-03-05 ENCOUNTER — Other Ambulatory Visit: Payer: Self-pay | Admitting: Internal Medicine

## 2014-03-20 ENCOUNTER — Encounter: Payer: Self-pay | Admitting: Endocrinology

## 2014-03-20 ENCOUNTER — Ambulatory Visit (INDEPENDENT_AMBULATORY_CARE_PROVIDER_SITE_OTHER): Payer: Managed Care, Other (non HMO) | Admitting: Endocrinology

## 2014-03-20 VITALS — BP 144/106 | HR 111 | Temp 98.1°F | Resp 14 | Ht 69.0 in | Wt 220.0 lb

## 2014-03-20 DIAGNOSIS — I10 Essential (primary) hypertension: Secondary | ICD-10-CM

## 2014-03-20 DIAGNOSIS — E1165 Type 2 diabetes mellitus with hyperglycemia: Secondary | ICD-10-CM

## 2014-03-20 DIAGNOSIS — E119 Type 2 diabetes mellitus without complications: Secondary | ICD-10-CM

## 2014-03-20 DIAGNOSIS — IMO0002 Reserved for concepts with insufficient information to code with codable children: Secondary | ICD-10-CM

## 2014-03-20 DIAGNOSIS — E785 Hyperlipidemia, unspecified: Secondary | ICD-10-CM

## 2014-03-20 LAB — COMPREHENSIVE METABOLIC PANEL
ALBUMIN: 4.2 g/dL (ref 3.5–5.2)
ALK PHOS: 76 U/L (ref 39–117)
ALT: 30 U/L (ref 0–53)
AST: 31 U/L (ref 0–37)
BILIRUBIN TOTAL: 0.6 mg/dL (ref 0.2–1.2)
BUN: 16 mg/dL (ref 6–23)
CHLORIDE: 103 meq/L (ref 96–112)
CO2: 28 meq/L (ref 19–32)
Calcium: 9.7 mg/dL (ref 8.4–10.5)
Creatinine, Ser: 0.84 mg/dL (ref 0.40–1.50)
GFR: 108.45 mL/min (ref >60.00)
GLUCOSE: 128 mg/dL — AB (ref 70–99)
POTASSIUM: 3.9 meq/L (ref 3.5–5.1)
Sodium: 139 mEq/L (ref 135–145)
TOTAL PROTEIN: 7.6 g/dL (ref 6.0–8.3)

## 2014-03-20 LAB — HEMOGLOBIN A1C: Hgb A1c MFr Bld: 6.1 % (ref 4.6–6.5)

## 2014-03-20 NOTE — Progress Notes (Signed)
Peter Keller, male   DOB: 1975-09-30, 39 y.o.   MRN: [id number]    Reason for Appointment:  for Type 2 Diabetes   History of Present Illness:          Diagnosis: Type 2 diabetes mellitus, date of diagnosis: 6/15       Past history: He has had periodic screening for diabetes but none since 2012. Previous A1c tests have been normal and last random glucose was 110 Since about 3/15 he had been having increased thirst and urination and was found to have a glucose of 314 an A1c of 11% He was started on Janumet XR as initial treatment and this was titrated to the maximum dose Because of persistently high readings averaging over 200 he was started on Amaryl also in 6/15 With Amaryl his blood sugars were significantly better and A1c was back to normal  Recent history:  His Amaryl was changed to Invokana 100 mg daily in 8/15 especially because of weight gain He was started on Trulicity 0.75 mg in late October 2015 because of difficulty with control of glucose and need for weight loss. On his own he stopped taking his Janumet along with his Amaryl. Trulicity was increased to 1.5 mg in 12/15; this helps him with satiety and initially had helped with his weight also. His last A1c was normal Although his blood sugars were relatively good in 10/15 and he was following a healthy lifestyle his blood sugars appear to be higher again. Problems identified:  He has not been consistent with his diet and also starting to drink significant amount of alcohol again  Has not exercised  He had been checking his blood sugars sporadically again and mostly in the mornings       Oral hypoglycemic drugs the patient is taking are:  Invokana 100 mg daily   Glucose monitoring:  using One Touch monitor  Results : Fasting glucose 159, 191; 162 after breakfast and 129 before lunch, most readings within the last week  Glycemic control:  Lab Results  Component Value Date   HGBA1C 5.7 12/06/2013    HGBA1C 6.4 10/04/2013   HGBA1C 11.0* 07/22/2013   Lab Results  Component Value Date   LDLCALC 110* 12/06/2013   CREATININE 0.9 12/06/2013    Self-care: The diet that the patient has been following is: Relatively low fat  Meals: 3 meals per day.          Exercise: not going to the gym currently           His last eye exam was about 2 years ago     Weight history: highest 235   Wt Readings from Last 3 Encounters:  03/20/14 220 lb (99.791 kg)  01/15/14 220 lb 12.8 oz (100.154 kg)  12/12/13 226 lb 6.4 oz (102.694 kg)      Medication List       This list is accurate as of: 03/20/14  8:41 AM.  Always use your most recent med list.               atorvastatin 20 MG tablet  Commonly known as:  LIPITOR  Take 1 tablet (20 mg total) by mouth daily.     Dulaglutide 1.5 MG/0.5ML Sopn  Commonly known as:  TRULICITY  Inject 1.5 mg into the skin once a week.     esomeprazole 20 MG capsule  Commonly known as:  NEXIUM  Take 20 mg by mouth daily at 12 noon.  fenofibrate 145 MG tablet  Commonly known as:  TRICOR  Take 145 mg by mouth daily.     INVOKANA 100 MG Tabs tablet  Generic drug:  canagliflozin  TAKE 1 TABLET BY MOUTH DAILY     losartan-hydrochlorothiazide 100-25 MG per tablet  Commonly known as:  HYZAAR  Take 1 tablet by mouth daily.     ONETOUCH DELICA LANCETS 33G Misc  Use to obtain a blood specimen once per day     ONETOUCH VERIO test strip  Generic drug:  glucose blood  USE AS INSTRUCTED TO CHECK BLOOD SUGAR ONCE DAILY     SitaGLIPtin-MetFORMIN HCl 50-1000 MG Tb24  Commonly known as:  JANUMET XR  Take 2 tablets once a day     Vitamin D 400 UNITS capsule  Take 500 Units by mouth daily.     Vitamin E 400 UNITS Tabs  Take 2 tablets (800 Units total) by mouth daily.        Allergies: No Known Allergies  Past Medical History  Diagnosis Date  . Hypertension   . Obesity   . Diabetes   . Hyperlipidemia   . Hepatic steatosis   . Sinus tachycardia    . Alcohol dependence   . GERD (gastroesophageal reflux disease)     Past Surgical History  Procedure Laterality Date  . None      Family History  Problem Relation Age of Onset  . Colon cancer Neg Hx   . Hypertension Mother   . Heart disease Paternal Uncle   . Heart attack Paternal Grandmother   . Diabetes Mother   . Esophageal cancer Neg Hx   . Stomach cancer Neg Hx     Social History:  reports that he has never smoked. He has never used smokeless tobacco. He reports that he drinks alcohol. He reports that he does not use illicit drugs.  Recently has been drinking 5-6 glasses of wine a day  Review of Systems       Lipids: on Zocor 20 mg previously in 2012 for mild hypercholesterolemia, LDL was 138. He took this for about a year only Has not had high triglycerides in the past With starting fenofibrate his triglycerides were improved but LDL was relatively high He has been on Lipitor 20 mg daily; was on fenofibrate but he has stopped the fenofibrate on his own       Lab Results  Component Value Date   CHOL 180 12/06/2013   HDL 39.60 12/06/2013   LDLCALC 110* 12/06/2013   LDLDIRECT 122.9 12/06/2013   TRIG 154.0* 12/06/2013   CHOLHDL 5 12/06/2013       Thyroid:  No  unusual fatigue.No palpitations but he says that his heart rate is usually fast and has been for sometime  Lab Results  Component Value Date   TSH 1.944 12/06/2013       The blood pressure has been treated with medications for 3-4 years, now taking Hyzaar from cardiologist.  His blood pressure is again higher today  Tends to have white coat syndrome also  Ultrasound shows fatty liver with indeterminate area near the gallbladder   Physical Examination:  BP 144/106 mmHg  Pulse 111  Temp(Src) 98.1 F (36.7 C)  Resp 14  Ht  (1.753 m)  Wt 220 lb (99.791 kg)  BMI 32.47 kg/m2  SpO2 95%  Repeat blood pressure 128/90    ASSESSMENT:  1. Diabetes type 2, uncontrolled with obesity  He has  good results with taking  Invokana and Trulicity 1.5 mg and no side effects. Although he has not checked his blood sugars his fasting readings appear to be relatively high However as discussed in history of present illness he has not been compliant with his exercise regimen and gaining weight from excess calories including from alcohol Since he had not been taking metformin will need to start him back on this for multiple benefits  2. Hyperlipidemia: Most likely this is not controlled now especially triglycerides because of increased alcohol intake and some weight gain.  Will need to defer rechecking until next visit  3.  Hypertension: Currently not well controlled, has been followed by cardiologist.  Most likely can improve control with regular exercise, weight loss and sniffing and reduction of alcohol intake  PLAN:  Continue Invokana 100 mg in the mornings  Start metformin ER 750 mg daily for 5 days and then 1500 mg daily.  Continue Trulicity 1.5 mg   More regular blood sugar monitoring  Restart exercise at least 3 days a week  He agrees to decrease and eliminate alcohol again.  She does not want to consider support groups are medications  Consider adding Norvasc if blood pressure not controlled  Consistent low saturated fat diet  Counseling time over 50% of today's 25 minute visit  Tajanay Hurley 03/20/2014, 8:41 AM   Note: This office note was prepared with Insurance underwriterDragon voice recognition system technology. Any transcriptional errors that result from this process are unintentional.

## 2014-03-20 NOTE — Patient Instructions (Signed)
Please check blood sugars at least half the time about 2 hours after any meal and 3 times per week on waking up. Please bring blood sugar monitor to each visit. Recommended blood sugar levels about 2 hours after meal is 140-160 and on waking up 90-120  Start taking Metformin 750 mg, 1 tablet with your main meal for 5 days and then 2 daily.    Exercise daily

## 2014-03-24 ENCOUNTER — Other Ambulatory Visit: Payer: Self-pay | Admitting: *Deleted

## 2014-03-24 ENCOUNTER — Telehealth: Payer: Self-pay | Admitting: Endocrinology

## 2014-03-24 MED ORDER — METFORMIN HCL ER 750 MG PO TB24: 750.0000 mg | ORAL_TABLET | Freq: Every day | ORAL | Status: DC

## 2014-03-24 NOTE — Telephone Encounter (Signed)
rx sent

## 2014-03-24 NOTE — Telephone Encounter (Signed)
Please call [facility] and rx the metformin 750 mg

## 2014-04-29 ENCOUNTER — Other Ambulatory Visit: Payer: Self-pay | Admitting: Endocrinology

## 2014-06-18 ENCOUNTER — Ambulatory Visit: Payer: Managed Care, Other (non HMO) | Admitting: Endocrinology

## 2014-06-28 ENCOUNTER — Other Ambulatory Visit: Payer: Self-pay | Admitting: Endocrinology

## 2014-07-02 ENCOUNTER — Ambulatory Visit: Payer: Managed Care, Other (non HMO) | Admitting: Endocrinology

## 2014-07-18 ENCOUNTER — Other Ambulatory Visit: Payer: Self-pay | Admitting: Endocrinology

## 2014-07-20 ENCOUNTER — Other Ambulatory Visit: Payer: Self-pay | Admitting: Endocrinology

## 2014-07-26 ENCOUNTER — Other Ambulatory Visit: Payer: Self-pay | Admitting: Cardiovascular Disease

## 2014-07-28 NOTE — Telephone Encounter (Signed)
Rx(s) sent to pharmacy electronically.  

## 2014-08-23 ENCOUNTER — Other Ambulatory Visit: Payer: Self-pay | Admitting: Endocrinology

## 2014-08-29 ENCOUNTER — Other Ambulatory Visit: Payer: Self-pay | Admitting: *Deleted

## 2014-08-29 ENCOUNTER — Other Ambulatory Visit (INDEPENDENT_AMBULATORY_CARE_PROVIDER_SITE_OTHER): Payer: Managed Care, Other (non HMO) | Admitting: *Deleted

## 2014-08-29 ENCOUNTER — Encounter: Payer: Self-pay | Admitting: Internal Medicine

## 2014-08-29 ENCOUNTER — Ambulatory Visit (INDEPENDENT_AMBULATORY_CARE_PROVIDER_SITE_OTHER): Payer: Managed Care, Other (non HMO) | Admitting: Internal Medicine

## 2014-08-29 VITALS — BP 136/90 | HR 104 | Temp 98.4°F | Resp 12 | Ht 68.0 in | Wt 227.8 lb

## 2014-08-29 DIAGNOSIS — E119 Type 2 diabetes mellitus without complications: Secondary | ICD-10-CM | POA: Diagnosis not present

## 2014-08-29 DIAGNOSIS — R740 Nonspecific elevation of levels of transaminase and lactic acid dehydrogenase [LDH]: Secondary | ICD-10-CM

## 2014-08-29 DIAGNOSIS — E781 Pure hyperglyceridemia: Secondary | ICD-10-CM | POA: Diagnosis not present

## 2014-08-29 DIAGNOSIS — E1165 Type 2 diabetes mellitus with hyperglycemia: Secondary | ICD-10-CM | POA: Diagnosis not present

## 2014-08-29 DIAGNOSIS — K760 Fatty (change of) liver, not elsewhere classified: Secondary | ICD-10-CM | POA: Insufficient documentation

## 2014-08-29 DIAGNOSIS — IMO0002 Reserved for concepts with insufficient information to code with codable children: Secondary | ICD-10-CM

## 2014-08-29 DIAGNOSIS — E785 Hyperlipidemia, unspecified: Secondary | ICD-10-CM

## 2014-08-29 DIAGNOSIS — E1169 Type 2 diabetes mellitus with other specified complication: Secondary | ICD-10-CM | POA: Insufficient documentation

## 2014-08-29 DIAGNOSIS — I10 Essential (primary) hypertension: Secondary | ICD-10-CM

## 2014-08-29 DIAGNOSIS — R7401 Elevation of levels of liver transaminase levels: Secondary | ICD-10-CM

## 2014-08-29 LAB — COMPREHENSIVE METABOLIC PANEL
ALT: 45 U/L (ref 0–53)
AST: 52 U/L — ABNORMAL HIGH (ref 0–37)
Albumin: 4.4 g/dL (ref 3.5–5.2)
Alkaline Phosphatase: 69 U/L (ref 39–117)
BUN: 15 mg/dL (ref 6–23)
CO2: 28 mEq/L (ref 19–32)
CREATININE: 0.77 mg/dL (ref 0.40–1.50)
Calcium: 9.9 mg/dL (ref 8.4–10.5)
Chloride: 102 mEq/L (ref 96–112)
GFR: 119.62 mL/min (ref >60.00)
Glucose, Bld: 119 mg/dL — ABNORMAL HIGH (ref 70–99)
Potassium: 3.6 mEq/L (ref 3.5–5.1)
Sodium: 139 mEq/L (ref 135–145)
Total Bilirubin: 0.7 mg/dL (ref 0.2–1.2)
Total Protein: 7.9 g/dL (ref 6.0–8.3)

## 2014-08-29 LAB — LIPID PANEL
Cholesterol: 176 mg/dL (ref 0–200)
HDL: 46.3 mg/dL (ref >39.00)
LDL Cholesterol: 101 mg/dL — ABNORMAL HIGH (ref 0–99)
NonHDL: 129.7
Total CHOL/HDL Ratio: 4
Triglycerides: 145 mg/dL (ref 0.0–149.0)
VLDL: 29 mg/dL (ref 0.0–40.0)

## 2014-08-29 LAB — POCT GLYCOSYLATED HEMOGLOBIN (HGB A1C): HEMOGLOBIN A1C: 6.3

## 2014-08-29 MED ORDER — SITAGLIPTIN PHOSPHATE 100 MG PO TABS: 100.0000 mg | ORAL_TABLET | Freq: Every day | ORAL | Status: DC

## 2014-08-29 MED ORDER — GLIPIZIDE 5 MG PO TABS: 5.0000 mg | ORAL_TABLET | Freq: Every day | ORAL | Status: DC

## 2014-08-29 MED ORDER — CANAGLIFLOZIN 100 MG PO TABS: 100.0000 mg | ORAL_TABLET | Freq: Every day | ORAL | Status: DC

## 2014-08-29 MED ORDER — METFORMIN HCL 1000 MG PO TABS: 1000.0000 mg | ORAL_TABLET | Freq: Two times a day (BID) | ORAL | Status: DC

## 2014-08-29 MED ORDER — LOSARTAN POTASSIUM-HCTZ 100-25 MG PO TABS: 1.0000 | ORAL_TABLET | Freq: Every day | ORAL | Status: DC

## 2014-08-29 NOTE — Progress Notes (Signed)
Patient ID: Peter Keller, male   DOB: 02/22/1975, 39 y.o.   MRN: [id number]  HPI: Peter Keller is a 39 y.o.-year-old male, self- referred for management of DM2, dx in 04/2013 (A1c was 11%), non-insulin-dependent, uncontrolled, without complications. He is switching from Dr Lucianne Muss. Last visit with Dr. Lucianne Muss 03/2014.  Last hemoglobin A1c was: Lab Results  Component Value Date   HGBA1C 6.1 03/20/2014   HGBA1C 5.7 12/06/2013   HGBA1C 6.4 10/04/2013   Pt is on a regimen of: - Metformin XR 1500 mg at night - Trulicity 1.5 mg weekly >> sugars higher closer to the next dose - Invokana 100 mg daily  Patient was initially on Janumet extended-release, which was subsequently stopped.  Amaryl was added 07/2013, but then switched to Invokana 100 mg daily in 09/2013 because of weight gain. Trulicity 0.75 mg weekly was added in 11/2013 for help with weight loss >> increased to 1.5 mg weekly in 01/2014. Metformin extended-release was started back in 03/2014 and increased to 1500 mg daily.  Pt checks his sugars 1x a day and they are: - am: ave 135; but 101-140s, if forgets meds 160 - 2h after b'fast: n/c - before lunch: n/c - 2h after lunch: n/c - before dinner: n/c - 2h after dinner: 120-160 - bedtime: n/c - nighttime: n/c No lows. Lowest sugar was 100; + hypoglycemia awareness  - ? Highest sugar was 180.  Glucometer: One Touch  Pt's meals are: - Breakfast: coffee + skimmed milk- bagel and egg - Lunch: sandwich - Dinner: pasta/fish/chicken/beef with wine - Snacks: almost none No sodas. He reduced pizza to once a month. Drinks 4 drinks of wine a night.  No regular exercise.  - no CKD, last BUN/creatinine:  Lab Results  Component Value Date   BUN 16 03/20/2014   CREATININE 0.84 03/20/2014   - last set of lipids: Lab Results  Component Value Date   CHOL 180 12/06/2013   HDL 39.60 12/06/2013   LDLCALC 110* 12/06/2013   LDLDIRECT 122.9 12/06/2013   TRIG 154.0* 12/06/2013   CHOLHDL  5 12/06/2013   - last eye exam was in 2014. No DR.  - no numbness and tingling in his feet.  Pt has FH of DM in sister, mother.   ROS: Constitutional: no weight gain/loss, no fatigue, no subjective hyperthermia/hypothermia Eyes: no blurry vision, no xerophthalmia ENT: no sore throat, no nodules palpated in throat, no dysphagia/odynophagia, no hoarseness Cardiovascular: no CP/SOB/palpitations/leg swelling Respiratory: no cough/SOB Gastrointestinal: no N/V/D/C Musculoskeletal: no muscle/joint aches Skin: no rashes Neurological: no tremors/numbness/tingling/dizziness Psychiatric: no depression/anxiety  Past Medical History  Diagnosis Date  . Hypertension   . Obesity   . Diabetes   . Hyperlipidemia   . Hepatic steatosis   . Sinus tachycardia   . Alcohol dependence   . GERD (gastroesophageal reflux disease)    Past Surgical History  Procedure Laterality Date  . None     History   Social History  . Marital Status: Married    Spouse Name: N/A  . Number of Children: 2   Occupational History  .  Nurse, adult    Social History Main Topics  . Smoking status: Never Smoker   . Smokeless tobacco: Never Used  . Alcohol Use: Yes     Comment: 3 alcoholic drinks daily  . Drug Use: No   Current Outpatient Prescriptions on File Prior to Visit  Medication Sig Dispense Refill  . atorvastatin (LIPITOR) 20 MG tablet TAKE 1 TABLET (20 MG TOTAL) BY  MOUTH DAILY. 30 tablet 3  . Cholecalciferol (VITAMIN D) 400 UNITS capsule Take 500 Units by mouth daily.    . Dulaglutide (TRULICITY) 1.5 MG/0.5ML SOPN Inject 1.5 mg into the skin once a week. 4 pen 3  . esomeprazole (NEXIUM) 20 MG capsule Take 20 mg by mouth daily at 12 noon.    . INVOKANA 100 MG TABS tablet TAKE 1 TABLET BY MOUTH DAILY 30 tablet 2  . losartan-hydrochlorothiazide (HYZAAR) 100-25 MG per tablet Take 1 tablet by mouth daily. PATIENT NEEDS TO CONTACT OFFICE FOR ADDITIONAL REFILLS 30 tablet 1  . metFORMIN (GLUCOPHAGE-XR)  750 MG 24 hr tablet TAKE 1 TABLET (750 MG TOTAL) BY MOUTH DAILY WITH BREAKFAST. 30 tablet 2  . ONETOUCH DELICA LANCETS 33G MISC Use to obtain a blood specimen once per day 100 each 1  . ONETOUCH VERIO test strip USE AS INSTRUCTED TO CHECK BLOOD SUGAR ONCE DAILY 50 each 3   No current facility-administered medications on file prior to visit.   No Known Allergies Family History  Problem Relation Age of Onset  . Colon cancer Neg Hx   . Hypertension Mother   . Heart disease Paternal Uncle   . Heart attack Paternal Grandmother   . Diabetes Mother   . Esophageal cancer Neg Hx   . Stomach cancer Neg Hx    PE: BP 136/90 mmHg  Pulse 104  Temp(Src) 98.4 F (36.9 C) (Oral)  Resp 12  Ht  (1.727 m)  Wt 227 lb 12.8 oz (103.329 kg)  BMI 34.64 kg/m2  SpO2 97% Wt Readings from Last 3 Encounters:  08/29/14 227 lb 12.8 oz (103.329 kg)  03/20/14 220 lb (99.791 kg)  01/15/14 220 lb 12.8 oz (100.154 kg)   Constitutional: overweight, in NAD Eyes: PERRLA, EOMI, no exophthalmos ENT: moist mucous membranes, no thyromegaly, no cervical lymphadenopathy Cardiovascular: Tachycardia, RR, No MRG Respiratory: CTA B Gastrointestinal: abdomen soft, NT, ND, BS+ Musculoskeletal: no deformities, strength intact in all 4 Skin: moist, warm, no rashes Neurological: no tremor with outstretched hands, DTR normal in all 4  ASSESSMENT: 1. DM2, non-insulin-dependent, controlled, without complications  2. Hypertriglyceridemia  3. Essential hypertension   4. Transaminitis  PLAN:  1. Patient with relatively recent diabetes diagnosis, with improved control on oral antidiabetic regimen. His hemoglobin A1c has been at goal, and his sugars appear controlled, but he would like to switch to less expensive regimen. He agrees to try regular metformin instead of extended-release. We can increase the dose of metformin from 1500 mg a day 2000 g twice a day. - We discussed about options for treatment, and I suggested  to stop Trulicity and restart Januvia, which was working well for him in the past, and also add glipizide as needed before a larger meal:  Patient Instructions  Please start: - Metformin 1000 mg 2x a day with meals - Januvia 100 mg in am  Continue  - Invokana 100 mg in am  Use as needed: - Glipizide 5 mg before a larger dinner.  Please stop at the lab.  Please return in 1.5 month with your sugar log.   - Strongly advised him tocontinue  checking sugars  once a day but at different times of the day, rotating checks - given sugar log and advised how to fill it and to bring it at next appt  - given foot care handout and explained the principles  - given instructions for hypoglycemia management "15-15 rule"  - advised for yearly eye exams >>  he is due! - checked >> 6.3% - Return to clinic in 1.5 mo with sugar log   2. Hypertriglyceridemia - Triglycerides in the 900s at the time of diagnosis, now at target, off fenofibrate - He has cut down on fatty foods - Continue to stay off fenofibrate and continue to watch his diet  - Also encouraged exercise   3. Essential hypertension - He is on Hyzaar 100-25 milligrams daily and also on Invokana which can act as a diuretic - Blood pressure slightly higher than target today, both he mentions he has whitecoat hypertension - We'll check his electrolytes - Refilled Hyzaar - May need addition of Norvasc in the future   4. Transaminitis - Right upper quadrant ultrasound from 08/07/2013 shows probable fatty infiltration, confirmed on the abdominal MRI 08/13/2013 - AST> ALT, consistent with excessive alcohol use - He is drinking 4 drinks of wine every night >> he knows he needs to cut down and was successful for 2 weeks in the past, would like to retry for a longer period, 6 weeks  - Recheck AST and ALT today   - time spent with the patient: 1 hour, of which >50% was spent in obtaining information about his chronic conditions listed above,  reviewing his previous labs, evaluations, and treatments, counseling him about his conditions and healthy nutrition (see discussed topics above), and developing a plan to further investigate and treat them; he had a number of questions which I addressed.  Orders Only on 08/29/2014  Component Date Value Ref Range Status  . Hemoglobin A1C 08/29/2014 6.3   Final  Office Visit on 08/29/2014  Component Date Value Ref Range Status  . Sodium 08/29/2014 139  135 - 145 mEq/L Final  . Potassium 08/29/2014 3.6  3.5 - 5.1 mEq/L Final  . Chloride 08/29/2014 102  96 - 112 mEq/L Final  . CO2 08/29/2014 28  19 - 32 mEq/L Final  . Glucose, Bld 08/29/2014 119* 70 - 99 mg/dL Final  . BUN 09/81/191407/15/2016 15  6 - 23 mg/dL Final  . Creatinine, Ser 08/29/2014 0.77  0.40 - 1.50 mg/dL Final  . Total Bilirubin 08/29/2014 0.7  0.2 - 1.2 mg/dL Final  . Alkaline Phosphatase 08/29/2014 69  39 - 117 U/L Final  . AST 08/29/2014 52* 0 - 37 U/L Final  . ALT 08/29/2014 45  0 - 53 U/L Final  . Total Protein 08/29/2014 7.9  6.0 - 8.3 g/dL Final  . Albumin 78/29/562107/15/2016 4.4  3.5 - 5.2 g/dL Final  . Calcium 30/86/578407/15/2016 9.9  8.4 - 10.5 mg/dL Final  . GFR 69/62/952807/15/2016 119.62  >60.00 mL/min Final  . Cholesterol 08/29/2014 176  0 - 200 mg/dL Final   ATP III Classification       Desirable:  < 200 mg/dL               Borderline High:  200 - 239 mg/dL          High:  > = 413240 mg/dL  . Triglycerides 08/29/2014 145.0  0.0 - 149.0 mg/dL Final   Normal:  <244<150 mg/dLBorderline High:  150 - 199 mg/dL  . HDL 08/29/2014 46.30  >39.00 mg/dL Final  . VLDL 01/02/725307/15/2016 29.0  0.0 - 40.0 mg/dL Final  . LDL Cholesterol 08/29/2014 101* 0 - 99 mg/dL Final  . Total CHOL/HDL Ratio 08/29/2014 4   Final                  Men  Women1/2 Average Risk     3.4          3.3Average Risk          5.0          4.42X Average Risk          9.6          7.13X Average Risk          15.0          11.0                      . NonHDL 08/29/2014 129.70   Final   NOTE:   Non-HDL goal should be 30 mg/dL higher than patient's LDL goal (i.e. LDL goal of < 70 mg/dL, would have non-HDL goal of < 100 mg/dL)   Lipid panel improved. No need to add fenofibrate. Hemoglobin A1c only slightly higher, still very good. AST slightly higher than before. GFR normal.

## 2014-08-29 NOTE — Patient Instructions (Signed)
Please start: - Metformin 1000 mg 2x a day with meals - Januvia 100 mg in am  Continue  - Invokana 100 mg in am  Use as needed: - Glipizide 5 mg before a larger dinner.  Please stop at the lab.  Please return in 1.5 month with your sugar log.   PATIENT INSTRUCTIONS FOR TYPE 2 DIABETES:  **Please join MyChart!** - see attached instructions about how to join if you have not done so already.  DIET AND EXERCISE Diet and exercise is an important part of diabetic treatment.  We recommended aerobic exercise in the form of brisk walking (working between 40-60% of maximal aerobic capacity, similar to brisk walking) for 150 minutes per week (such as 30 minutes five days per week) along with 3 times per week performing 'resistance' training (using various gauge rubber tubes with handles) 5-10 exercises involving the major muscle groups (upper body, lower body and core) performing 10-15 repetitions (or near fatigue) each exercise. Start at half the above goal but build slowly to reach the above goals. If limited by weight, joint pain, or disability, we recommend daily walking in a swimming pool with water up to waist to reduce pressure from joints while allow for adequate exercise.    BLOOD GLUCOSES Monitoring your blood glucoses is important for continued management of your diabetes. Please check your blood glucoses 2-4 times a day: fasting, before meals and at bedtime (you can rotate these measurements - e.g. one day check before the 3 meals, the next day check before 2 of the meals and before bedtime, etc.).   HYPOGLYCEMIA (low blood sugar) Hypoglycemia is usually a reaction to not eating, exercising, or taking too much insulin/ other diabetes drugs.  Symptoms include tremors, sweating, hunger, confusion, headache, etc. Treat IMMEDIATELY with 15 grams of Carbs: . 4 glucose tablets .  cup regular juice/soda . 2 tablespoons raisins . 4 teaspoons sugar . 1 tablespoon honey Recheck blood  glucose in 15 mins and repeat above if still symptomatic/blood glucose <100.  RECOMMENDATIONS TO REDUCE YOUR RISK OF DIABETIC COMPLICATIONS: * Take your prescribed MEDICATION(S) * Follow a DIABETIC diet: Complex carbs, fiber rich foods, (monounsaturated and polyunsaturated) fats * AVOID saturated/trans fats, high fat foods, >2,300 mg salt per day. * EXERCISE at least 5 times a week for 30 minutes or preferably daily.  * DO NOT SMOKE OR DRINK more than 1 drink a day. * Check your FEET every day. Do not wear tightfitting shoes. Contact us if you develop an ulcer * See your EYE doctor once a year or more if needed * Get a FLU shot once a year * Get a PNEUMONIA vaccine once before and once after age 39 years  GOALS:  * Your Hemoglobin A1c of <7%  * fasting sugars need to be <130 * after meals sugars need to be <180 (2h after you start eating) * Your Systolic BP should be 140 or lower  * Your Diastolic BP should be 80 or lower  * Your HDL (Good Cholesterol) should be 40 or higher  * Your LDL (Bad Cholesterol) should be 100 or lower. * Your Triglycerides should be 150 or lower  * Your Urine microalbumin (kidney function) should be <30 * Your Body Mass Index should be 25 or lower    Please consider the following ways to cut down carbs and fat and increase fiber and micronutrients in your diet: - substitute whole grain for white bread or pasta - substitute brown rice for white  rice - substitute 90-calorie flat bread pieces for slices of bread when possible - substitute sweet potatoes or yams for white potatoes - substitute humus for margarine - substitute tofu for cheese when possible - substitute almond or rice milk for regular milk (would not drink soy milk daily due to concern for soy estrogen influence on breast cancer risk) - substitute dark chocolate for other sweets when possible - substitute water - can add lemon or orange slices for taste - for diet sodas (artificial sweeteners  will trick your body that you can eat sweets without getting calories and will lead you to overeating and weight gain in the long run) - do not skip breakfast or other meals (this will slow down the metabolism and will result in more weight gain over time)  - can try smoothies made from fruit and almond/rice milk in am instead of regular breakfast - can also try old-fashioned (not instant) oatmeal made with almond/rice milk in am - order the dressing on the side when eating salad at a restaurant (pour less than half of the dressing on the salad) - eat as little meat as possible - can try juicing, but should not forget that juicing will get rid of the fiber, so would alternate with eating raw veg./fruits or drinking smoothies - use as little oil as possible, even when using olive oil - can dress a salad with a mix of balsamic vinegar and lemon juice, for e.g. - use agave nectar, stevia sugar, or regular sugar rather than artificial sweateners - steam or broil/roast veggies  - snack on veggies/fruit/nuts (unsalted, preferably) when possible, rather than processed foods - reduce or eliminate aspartame in diet (it is in diet sodas, chewing gum, etc) Read the labels!  Try to read Dr. Katherina Right book: "Program for Reversing Diabetes" for other ideas for healthy eating.

## 2014-10-14 ENCOUNTER — Ambulatory Visit (INDEPENDENT_AMBULATORY_CARE_PROVIDER_SITE_OTHER): Payer: Managed Care, Other (non HMO) | Admitting: Internal Medicine

## 2014-10-14 ENCOUNTER — Encounter: Payer: Self-pay | Admitting: Internal Medicine

## 2014-10-14 VITALS — BP 132/88 | HR 93 | Temp 98.3°F | Resp 12 | Wt 225.0 lb

## 2014-10-14 DIAGNOSIS — E781 Pure hyperglyceridemia: Secondary | ICD-10-CM | POA: Diagnosis not present

## 2014-10-14 DIAGNOSIS — R740 Nonspecific elevation of levels of transaminase and lactic acid dehydrogenase [LDH]: Secondary | ICD-10-CM

## 2014-10-14 DIAGNOSIS — E119 Type 2 diabetes mellitus without complications: Secondary | ICD-10-CM

## 2014-10-14 DIAGNOSIS — R7401 Elevation of levels of liver transaminase levels: Secondary | ICD-10-CM

## 2014-10-14 DIAGNOSIS — I10 Essential (primary) hypertension: Secondary | ICD-10-CM

## 2014-10-14 NOTE — Progress Notes (Addendum)
Patient ID: Peter Keller, male   DOB: 1975-07-14, 39 y.o.   MRN: [id number]  HPI: Peter Keller is a 39 y.o.-year-old male, returning for follow-up for DM2, dx in 04/2013 (A1c was 11%), non-insulin-dependent, uncontrolled, without complications. Last visit 1.5 months ago  He started a new diet >> Ideal Protein Diet - low carb, no starches. He started this last week. He lost 7 pounds, but gained 2 of them over the weekend. He also reduced his alcohol intake, before drinking for glasses of wine a night, now only drinking alcohol during the weekend, and in lower amounts.  Last hemoglobin A1c was: Lab Results  Component Value Date   HGBA1C 6.3 08/29/2014   HGBA1C 6.1 03/20/2014   HGBA1C 5.7 12/06/2013   Pt was on a regimen of: - Metformin XR 1500 mg at night - Trulicity 1.5 mg weekly >> sugars higher closer to the next dose - Invokana 100 mg daily - Glipizie 5 mg before a large dinner Patient was initially on Janumet extended-release, which was subsequently stopped.  Amaryl was added 07/2013, but then switched to Invokana 100 mg daily in 09/2013 because of weight gain. Trulicity 0.75 mg weekly was added in 11/2013 for help with weight loss >> increased to 1.5 mg weekly in 01/2014. Metformin extended-release was started back in 03/2014 and increased to 1500 mg daily.  He is now on: - Metformin 1000 mg 1x a day >> decreased after he started the diet - Januvia 100 mg in am - Invokana 100 mg in am  Pt checks his sugars 0-1x a day and they are: - am: ave 135; but 101-140s, if forgets meds 160 >> 97, 116-151, 181, 217 (on diet, 116 and 129) - 2h after b'fast: n/c - before lunch: n/c - 2h after lunch: n/c - before dinner: n/c - 2h after dinner: 120-160 >> 121 on diet - bedtime: n/c >> 209 whiling vacation - nighttime: n/c No lows. Lowest sugar was 97; + hypoglycemia awareness  - ? Highest sugar was 180 >> 200s  Glucometer: One Touch  No regular exercise.  - no CKD, last BUN/creatinine:   Lab Results  Component Value Date   BUN 15 08/29/2014   CREATININE 0.77 08/29/2014   - last set of lipids: Lab Results  Component Value Date   CHOL 176 08/29/2014   HDL 46.30 08/29/2014   LDLCALC 101* 08/29/2014   LDLDIRECT 122.9 12/06/2013   TRIG 145.0 08/29/2014   CHOLHDL 4 08/29/2014  Lipitor 40. - last eye exam was in 2014. No DR.  - no numbness and tingling in his feet.  ROS: Constitutional: + weight loss, no fatigue, no subjective hyperthermia/hypothermia Eyes: no blurry vision, no xerophthalmia ENT: no sore throat, no nodules palpated in throat, no dysphagia/odynophagia, no hoarseness Cardiovascular: no CP/SOB/palpitations/leg swelling Respiratory: no cough/SOB Gastrointestinal: no N/V/D/C Musculoskeletal: no muscle/joint aches Skin: no rashes Neurological: no tremors/numbness/tingling/dizziness  I reviewed pt's medications, allergies, PMH, social hx, family hx, and changes were documented in the history of present illness. Otherwise, unchanged from my initial visit note.  Past Medical History  Diagnosis Date  . Hypertension   . Obesity   . Diabetes   . Hyperlipidemia   . Hepatic steatosis   . Sinus tachycardia   . Alcohol dependence   . GERD (gastroesophageal reflux disease)    Past Surgical History  Procedure Laterality Date  . None     History   Social History  . Marital Status: Married    Spouse Name: N/A  .  Number of Children: 2   Occupational History  .  Nurse, adult    Social History Main Topics  . Smoking status: Never Smoker   . Smokeless tobacco: Never Used  . Alcohol Use: Yes     Comment: 3 alcoholic drinks daily  . Drug Use: No   Current Outpatient Prescriptions on File Prior to Visit  Medication Sig Dispense Refill  . atorvastatin (LIPITOR) 20 MG tablet TAKE 1 TABLET (20 MG TOTAL) BY MOUTH DAILY. 30 tablet 3  . canagliflozin (INVOKANA) 100 MG TABS tablet Take 1 tablet (100 mg total) by mouth daily. 60 tablet 0  .  Cholecalciferol (VITAMIN D) 400 UNITS capsule Take 500 Units by mouth daily.    Marland Kitchen esomeprazole (NEXIUM) 20 MG capsule Take 20 mg by mouth daily at 12 noon.    Marland Kitchen glipiZIDE (GLUCOTROL) 5 MG tablet Take 1 tablet (5 mg total) by mouth daily with supper. 60 tablet 0  . losartan-hydrochlorothiazide (HYZAAR) 100-25 MG per tablet Take 1 tablet by mouth daily. 90 tablet 1  . metFORMIN (GLUCOPHAGE) 1000 MG tablet Take 1 tablet (1,000 mg total) by mouth 2 (two) times daily with a meal. 120 tablet 0  . ONETOUCH DELICA LANCETS 33G MISC Use to obtain a blood specimen once per day 100 each 1  . ONETOUCH VERIO test strip USE AS INSTRUCTED TO CHECK BLOOD SUGAR ONCE DAILY 50 each 3  . sitaGLIPtin (JANUVIA) 100 MG tablet Take 1 tablet (100 mg total) by mouth daily. 60 tablet 0   No current facility-administered medications on file prior to visit.   No Known Allergies Family History  Problem Relation Age of Onset  . Colon cancer Neg Hx   . Hypertension Mother   . Heart disease Paternal Uncle   . Heart attack Paternal Grandmother   . Diabetes Mother   . Esophageal cancer Neg Hx   . Stomach cancer Neg Hx    PE: BP 132/88 mmHg  Pulse 93  Temp(Src) 98.3 F (36.8 C) (Oral)  Resp 12  Wt 225 lb (102.059 kg)  SpO2 96% Wt Readings from Last 3 Encounters:  10/14/14 225 lb (102.059 kg)  08/29/14 227 lb 12.8 oz (103.329 kg)  03/20/14 220 lb (99.791 kg)   Constitutional: overweight, in NAD Eyes: PERRLA, EOMI, no exophthalmos ENT: moist mucous membranes, no thyromegaly, no cervical lymphadenopathy Cardiovascular: Tachycardia, RR, No MRG Respiratory: CTA B Gastrointestinal: abdomen soft, NT, ND, BS+ Musculoskeletal: no deformities, strength intact in all 4 Skin: moist, warm, no rashes Neurological: no tremor with outstretched hands, DTR normal in all 4  ASSESSMENT: 1. DM2, non-insulin-dependent, controlled, without complications  2. Hypertriglyceridemia  3. Essential hypertension   4.  Transaminitis - Right upper quadrant ultrasound from 08/07/2013 shows probable fatty infiltration, confirmed on the abdominal MRI 08/13/2013 - AST> ALT, consistent with excessive alcohol use  PLAN:  1. Patient with relatively recent diabetes diagnosis, with improved control on oral antidiabetic regimen. His hemoglobin A1c has been at goal. At last visit, he told me that he would like to switch to less expensive regimen. We switched to regular metformin instead of extended-release metformin. He is tolerating the regular metformin well, but he dropped the dose to 1000 mg daily since he started the low carb diet. We discussed about going back on the previous dose of metformin, of 1000 mg 2x a day, but try to cut down on Januvia instead. He agrees to try this. If sugars become were very well controlled, he can even stop Januvia  in the future. If he continues to lose weight, maybe we can also eliminate Invokana. He did not have to use glipizide since he started the diet. - I suggested to:  Patient Instructions  Please increase Metformin back to 1000 mg 2x a day. If sugars are well controlled, try reducing the Januvia to 50 mg daily. Continue Invokana 100 mg in am.  Please return in 3 months with your sugar log.  - Strongly advised him to continue  checking sugars  once a day,  at different times of the day, rotating checks - Again advised for yearly eye exams >> he is due! - Reviewed last hemoglobin A1c together with the patient >> 6.3% - Return to clinic in 3 mo with sugar log   2. Hypertriglyceridemia - Triglycerides were in the 900s at the time of diagnosis, now at target per check at last visit, off fenofibrate - He has cut down on fatty foods, not eating fries anymore - Continue to stay off fenofibrate and continue to watch his diet  - Also encouraged exercise   3. Essential hypertension - He is on Hyzaar 100-25 milligrams daily and also on Invokana which can act as a diuretic - Blood  pressure slightly higher than target again, both he mentions he has whitecoat hypertension - We checked his electrolytes at last visit, and they were normal - May need addition of Norvasc in the future   4. Transaminitis - He was drinking 4 drinks of wine every night >> he cut down on the drinks to only during the weekend, and switch to vodka, of which he is drinking less compared to wine. He continues to try to cut down further. - Recheck AST and ALT at next visit

## 2014-10-14 NOTE — Patient Instructions (Addendum)
Please increase Metformin back to 1000 mg 2x a day. If sugars are well controlled, try reducing the Januvia to 50 mg daily. Continue Invokana 100 mg in am.  Please return in 3 months with your sugar log.

## 2014-10-17 ENCOUNTER — Other Ambulatory Visit: Payer: Self-pay | Admitting: Internal Medicine

## 2014-10-17 ENCOUNTER — Other Ambulatory Visit: Payer: Self-pay | Admitting: Endocrinology

## 2014-10-21 ENCOUNTER — Other Ambulatory Visit: Payer: Self-pay | Admitting: *Deleted

## 2014-10-21 MED ORDER — ATORVASTATIN CALCIUM 20 MG PO TABS: ORAL_TABLET | ORAL | Status: DC

## 2014-10-23 ENCOUNTER — Other Ambulatory Visit: Payer: Self-pay | Admitting: Internal Medicine

## 2014-10-29 ENCOUNTER — Other Ambulatory Visit: Payer: Self-pay | Admitting: Internal Medicine

## 2014-12-28 ENCOUNTER — Other Ambulatory Visit: Payer: Self-pay | Admitting: Endocrinology

## 2014-12-30 ENCOUNTER — Other Ambulatory Visit: Payer: Self-pay | Admitting: *Deleted

## 2014-12-30 MED ORDER — ONETOUCH DELICA LANCETS 33G MISC: Status: DC

## 2015-01-26 ENCOUNTER — Other Ambulatory Visit: Payer: Self-pay | Admitting: Internal Medicine

## 2015-01-29 ENCOUNTER — Other Ambulatory Visit: Payer: Self-pay | Admitting: *Deleted

## 2015-01-29 MED ORDER — GLUCOSE BLOOD VI STRP: ORAL_STRIP | Status: DC

## 2015-03-23 ENCOUNTER — Other Ambulatory Visit: Payer: Self-pay | Admitting: Internal Medicine

## 2015-04-06 ENCOUNTER — Other Ambulatory Visit: Payer: Self-pay | Admitting: Internal Medicine

## 2015-04-07 ENCOUNTER — Other Ambulatory Visit (INDEPENDENT_AMBULATORY_CARE_PROVIDER_SITE_OTHER): Payer: Managed Care, Other (non HMO)

## 2015-04-07 ENCOUNTER — Telehealth: Payer: Self-pay

## 2015-04-07 ENCOUNTER — Ambulatory Visit (INDEPENDENT_AMBULATORY_CARE_PROVIDER_SITE_OTHER): Payer: Managed Care, Other (non HMO) | Admitting: Internal Medicine

## 2015-04-07 ENCOUNTER — Encounter: Payer: Self-pay | Admitting: Internal Medicine

## 2015-04-07 VITALS — BP 138/90 | HR 105 | Temp 98.4°F | Resp 18 | Ht 69.0 in | Wt 229.4 lb

## 2015-04-07 DIAGNOSIS — E1165 Type 2 diabetes mellitus with hyperglycemia: Secondary | ICD-10-CM | POA: Diagnosis not present

## 2015-04-07 DIAGNOSIS — Z0000 Encounter for general adult medical examination without abnormal findings: Secondary | ICD-10-CM

## 2015-04-07 DIAGNOSIS — E785 Hyperlipidemia, unspecified: Secondary | ICD-10-CM

## 2015-04-07 DIAGNOSIS — Z23 Encounter for immunization: Secondary | ICD-10-CM

## 2015-04-07 DIAGNOSIS — E119 Type 2 diabetes mellitus without complications: Secondary | ICD-10-CM | POA: Diagnosis not present

## 2015-04-07 DIAGNOSIS — IMO0001 Reserved for inherently not codable concepts without codable children: Secondary | ICD-10-CM

## 2015-04-07 DIAGNOSIS — I10 Essential (primary) hypertension: Secondary | ICD-10-CM

## 2015-04-07 DIAGNOSIS — E669 Obesity, unspecified: Secondary | ICD-10-CM

## 2015-04-07 LAB — COMPREHENSIVE METABOLIC PANEL
ALBUMIN: 4.6 g/dL (ref 3.5–5.2)
ALK PHOS: 58 U/L (ref 39–117)
ALT: 74 U/L — ABNORMAL HIGH (ref 0–53)
AST: 69 U/L — ABNORMAL HIGH (ref 0–37)
BUN: 15 mg/dL (ref 6–23)
CALCIUM: 9.8 mg/dL (ref 8.4–10.5)
CHLORIDE: 98 meq/L (ref 96–112)
CO2: 30 mEq/L (ref 19–32)
Creatinine, Ser: 0.83 mg/dL (ref 0.40–1.50)
GFR: 109.36 mL/min (ref >60.00)
Glucose, Bld: 173 mg/dL — ABNORMAL HIGH (ref 70–99)
POTASSIUM: 4 meq/L (ref 3.5–5.1)
SODIUM: 137 meq/L (ref 135–145)
Total Bilirubin: 0.9 mg/dL (ref 0.2–1.2)
Total Protein: 8 g/dL (ref 6.0–8.3)

## 2015-04-07 LAB — LIPID PANEL
CHOLESTEROL: 143 mg/dL (ref 0–200)
HDL: 38.5 mg/dL — AB (ref >39.00)
LDL Cholesterol: 68 mg/dL (ref 0–99)
NonHDL: 104.98
TRIGLYCERIDES: 185 mg/dL — AB (ref 0.0–149.0)
Total CHOL/HDL Ratio: 4
VLDL: 37 mg/dL (ref 0.0–40.0)

## 2015-04-07 LAB — CBC
HEMATOCRIT: 52.7 % — AB (ref 39.0–52.0)
Hemoglobin: 18.4 g/dL (ref 13.0–17.0)
MCHC: 34.9 g/dL (ref 30.0–36.0)
MCV: 87.6 fl (ref 78.0–100.0)
Platelets: 192 10*3/uL (ref 150.0–400.0)
RBC: 6.01 Mil/uL — AB (ref 4.22–5.81)
RDW: 12.9 % (ref 11.5–15.5)
WBC: 9.9 10*3/uL (ref 4.0–10.5)

## 2015-04-07 LAB — MICROALBUMIN / CREATININE URINE RATIO
Creatinine,U: 60.8 mg/dL
MICROALB/CREAT RATIO: 1.6 mg/g (ref 0.0–30.0)
Microalb, Ur: 1 mg/dL (ref 0.0–1.9)

## 2015-04-07 LAB — HEMOGLOBIN A1C: HEMOGLOBIN A1C: 6.5 % (ref 4.6–6.5)

## 2015-04-07 MED ORDER — GLIPIZIDE 5 MG PO TABS: 5.0000 mg | ORAL_TABLET | Freq: Every day | ORAL | Status: DC

## 2015-04-07 MED ORDER — CANAGLIFLOZIN 100 MG PO TABS: 100.0000 mg | ORAL_TABLET | Freq: Every day | ORAL | Status: DC

## 2015-04-07 MED ORDER — BUPROPION HCL ER (XL) 150 MG PO TB24: 150.0000 mg | ORAL_TABLET | Freq: Every day | ORAL | Status: DC

## 2015-04-07 NOTE — Assessment & Plan Note (Signed)
BP at goal on losartan/hctz. Checking CMP and adjust as needed.  

## 2015-04-07 NOTE — Telephone Encounter (Signed)
[facility] HEMOGLOBIN 18/4 GIVEN BY GILL IN [facility] EXT [id number] VERBAL READBACK READ AND ROUTED TO PCP

## 2015-04-07 NOTE — Assessment & Plan Note (Signed)
Complicated by diabetes, hypertension, and hyperlipidemia. Talked to him about diet and exercise to help his overall health.

## 2015-04-07 NOTE — Assessment & Plan Note (Signed)
Checking labs, talked to him about disease control and prevention of long term side effects. Given flu and pneumonia shot today. Will get tetanus next time. Exercising some now and non-smoker.

## 2015-04-07 NOTE — Addendum Note (Signed)
Addended by: Hillard Danker A on: 04/07/2015 11:18 AM   Modules accepted: Orders

## 2015-04-07 NOTE — Telephone Encounter (Signed)
Noted, thanks!

## 2015-04-07 NOTE — Progress Notes (Signed)
Pre visit review using our clinic review tool, if applicable. No additional management support is needed unless otherwise documented below in the visit note. 

## 2015-04-07 NOTE — Addendum Note (Signed)
Addended by: Conception Chancy R on: 04/07/2015 12:21 PM   Modules accepted: Orders

## 2015-04-07 NOTE — Assessment & Plan Note (Signed)
Checking lipid panel today and adjust as needed. Previously with high triglycerides and was on fenofibrate. With diet changes is off. Taking lipitor and adjust as needed. No side effects.

## 2015-04-07 NOTE — Patient Instructions (Signed)
We will check all the labs today and call you back with the results.   We have sent in the refills that you need.  Diabetes and Exercise Exercising regularly is important. It is not just about losing weight. It has many health benefits, such as:  Improving your overall fitness, flexibility, and endurance.  Increasing your bone density.  Helping with weight control.  Decreasing your body fat.  Increasing your muscle strength.  Reducing stress and tension.  Improving your overall health. People with diabetes who exercise gain additional benefits because exercise:  Reduces appetite.  Improves the body's use of blood sugar (glucose).  Helps lower or control blood glucose.  Decreases blood pressure.  Helps control blood lipids (such as cholesterol and triglycerides).  Improves the body's use of the hormone insulin by:  Increasing the body's insulin sensitivity.  Reducing the body's insulin needs.  Decreases the risk for heart disease because exercising:  Lowers cholesterol and triglycerides levels.  Increases the levels of good cholesterol (such as high-density lipoproteins [HDL]) in the body.  Lowers blood glucose levels. YOUR ACTIVITY PLAN  Choose an activity that you enjoy, and set realistic goals. To exercise safely, you should begin practicing any new physical activity slowly, and gradually increase the intensity of the exercise over time. Your health care provider or diabetes educator can help create an activity plan that works for you. General recommendations include:  Encouraging children to engage in at least 60 minutes of physical activity each day.  Stretching and performing strength training exercises, such as yoga or weight lifting, at least 2 times per week.  Performing a total of at least 150 minutes of moderate-intensity exercise each week, such as brisk walking or water aerobics.  Exercising at least 3 days per week, making sure you allow no more than  2 consecutive days to pass without exercising.  Avoiding long periods of inactivity (90 minutes or more). When you have to spend an extended period of time sitting down, take frequent breaks to walk or stretch. RECOMMENDATIONS FOR EXERCISING WITH TYPE 1 OR TYPE 2 DIABETES   Check your blood glucose before exercising. If blood glucose levels are greater than 240 mg/dL, check for urine ketones. Do not exercise if ketones are present.  Avoid injecting insulin into areas of the body that are going to be exercised. For example, avoid injecting insulin into:  The arms when playing tennis.  The legs when jogging.  Keep a record of:  Food intake before and after you exercise.  Expected peak times of insulin action.  Blood glucose levels before and after you exercise.  The type and amount of exercise you have done.  Review your records with your health care provider. Your health care provider will help you to develop guidelines for adjusting food intake and insulin amounts before and after exercising.  If you take insulin or oral hypoglycemic agents, watch for signs and symptoms of hypoglycemia. They include:  Dizziness.  Shaking.  Sweating.  Chills.  Confusion.  Drink plenty of water while you exercise to prevent dehydration or heat stroke. Body water is lost during exercise and must be replaced.  Talk to your health care provider before starting an exercise program to make sure it is safe for you. Remember, almost any type of activity is better than none.   This information is not intended to replace advice given to you by your health care provider. Make sure you discuss any questions you have with your health care provider.  Document Released: 04/23/2003 Document Revised: 06/17/2014 Document Reviewed: 07/10/2012 Elsevier Interactive Patient Education 2016 [employer].   Health Maintenance, Male A healthy lifestyle and preventative care can promote health and  wellness.  Maintain regular health, dental, and eye exams.  Eat a healthy diet. Foods like vegetables, fruits, whole grains, low-fat dairy products, and lean protein foods contain the nutrients you need and are low in calories. Decrease your intake of foods high in solid fats, added sugars, and salt. Get information about a proper diet from your health care provider, if necessary.  Regular physical exercise is one of the most important things you can do for your health. Most adults should get at least 150 minutes of moderate-intensity exercise (any activity that increases your heart rate and causes you to sweat) each week. In addition, most adults need muscle-strengthening exercises on 2 or more days a week.   Maintain a healthy weight. The body mass index (BMI) is a screening tool to identify possible weight problems. It provides an estimate of body fat based on height and weight. Your health care provider can find your BMI and can help you achieve or maintain a healthy weight. For males 20 years and older:  A BMI below 18.5 is considered underweight.  A BMI of 18.5 to 24.9 is normal.  A BMI of 25 to 29.9 is considered overweight.  A BMI of 30 and above is considered obese.  Maintain normal blood lipids and cholesterol by exercising and minimizing your intake of saturated fat. Eat a balanced diet with plenty of fruits and vegetables. Blood tests for lipids and cholesterol should begin at age 42 and be repeated every 5 years. If your lipid or cholesterol levels are high, you are over age 79, or you are at high risk for heart disease, you may need your cholesterol levels checked more frequently.Ongoing high lipid and cholesterol levels should be treated with medicines if diet and exercise are not working.  If you smoke, find out from your health care provider how to quit. If you do not use tobacco, do not start.  Lung cancer screening is recommended for adults aged 55-80 years who are at high  risk for developing lung cancer because of a history of smoking. A yearly low-dose CT scan of the lungs is recommended for people who have at least a 30-pack-year history of smoking and are current smokers or have quit within the past 15 years. A pack year of smoking is smoking an average of 1 pack of cigarettes a day for 1 year (for example, a 30-pack-year history of smoking could mean smoking 1 pack a day for 30 years or 2 packs a day for 15 years). Yearly screening should continue until the smoker has stopped smoking for at least 15 years. Yearly screening should be stopped for people who develop a health problem that would prevent them from having lung cancer treatment.  If you choose to drink alcohol, do not have more than 2 drinks per day. One drink is considered to be 12 oz (360 mL) of beer, 5 oz (150 mL) of wine, or 1.5 oz (45 mL) of liquor.  Avoid the use of street drugs. Do not share needles with anyone. Ask for help if you need support or instructions about stopping the use of drugs.  High blood pressure causes heart disease and increases the risk of stroke. High blood pressure is more likely to develop in:  People who have blood pressure in the end of the  normal range (100-139/85-89 mm Hg).  People who are overweight or obese.  People who are African American.  If you are 24-16 years of age, have your blood pressure checked every 3-5 years. If you are 53 years of age or older, have your blood pressure checked every year. You should have your blood pressure measured twice--once when you are at a hospital or clinic, and once when you are not at a hospital or clinic. Record the average of the two measurements. To check your blood pressure when you are not at a hospital or clinic, you can use:  An automated blood pressure machine at a pharmacy.  A home blood pressure monitor.  If you are 9-93 years old, ask your health care provider if you should take aspirin to prevent heart  disease.  Diabetes screening involves taking a blood sample to check your fasting blood sugar level. This should be done once every 3 years after age 76 if you are at a normal weight and without risk factors for diabetes. Testing should be considered at a younger age or be carried out more frequently if you are overweight and have at least 1 risk factor for diabetes.  Colorectal cancer can be detected and often prevented. Most routine colorectal cancer screening begins at the age of 70 and continues through age 65. However, your health care provider may recommend screening at an earlier age if you have risk factors for colon cancer. On a yearly basis, your health care provider may provide home test kits to check for hidden blood in the stool. A small camera at the end of a tube may be used to directly examine the colon (sigmoidoscopy or colonoscopy) to detect the earliest forms of colorectal cancer. Talk to your health care provider about this at age 90 when routine screening begins. A direct exam of the colon should be repeated every 5-10 years through age 52, unless early forms of precancerous polyps or small growths are found.  People who are at an increased risk for hepatitis B should be screened for this virus. You are considered at high risk for hepatitis B if:  You were born in a country where hepatitis B occurs often. Talk with your health care provider about which countries are considered high risk.  Your parents were born in a high-risk country and you have not received a shot to protect against hepatitis B (hepatitis B vaccine).  You have HIV or AIDS.  You use needles to inject street drugs.  You live with, or have sex with, someone who has hepatitis B.  You are a man who has sex with other men (MSM).  You get hemodialysis treatment.  You take certain medicines for conditions like cancer, organ transplantation, and autoimmune conditions.  Hepatitis C blood testing is recommended  for all people born from [birth year] through 1965 and any individual with known risk factors for hepatitis C.  Healthy men should no longer receive prostate-specific antigen (PSA) blood tests as part of routine cancer screening. Talk to your health care provider about prostate cancer screening.  Testicular cancer screening is not recommended for adolescents or adult males who have no symptoms. Screening includes self-exam, a health care provider exam, and other screening tests. Consult with your health care provider about any symptoms you have or any concerns you have about testicular cancer.  Practice safe sex. Use condoms and avoid high-risk sexual practices to reduce the spread of sexually transmitted infections (STIs).  You should be screened for  STIs, including gonorrhea and chlamydia if:  You are sexually active and are younger than 24 years.  You are older than 24 years, and your health care provider tells you that you are at risk for this type of infection.  Your sexual activity has changed since you were last screened, and you are at an increased risk for chlamydia or gonorrhea. Ask your health care provider if you are at risk.  If you are at risk of being infected with HIV, it is recommended that you take a prescription medicine daily to prevent HIV infection. This is called pre-exposure prophylaxis (PrEP). You are considered at risk if:  You are a man who has sex with other men (MSM).  You are a heterosexual man who is sexually active with multiple partners.  You take drugs by injection.  You are sexually active with a partner who has HIV.  Talk with your health care provider about whether you are at high risk of being infected with HIV. If you choose to begin PrEP, you should first be tested for HIV. You should then be tested every 3 months for as long as you are taking PrEP.  Use sunscreen. Apply sunscreen liberally and repeatedly throughout the day. You should seek shade when your  shadow is shorter than you. Protect yourself by wearing long sleeves, pants, a wide-brimmed hat, and sunglasses year round whenever you are outdoors.  Tell your health care provider of new moles or changes in moles, especially if there is a change in shape or color. Also, tell your health care provider if a mole is larger than the size of a pencil eraser.  A one-time screening for abdominal aortic aneurysm (AAA) and surgical repair of large AAAs by ultrasound is recommended for men aged 65-75 years who are current or former smokers.  Stay current with your vaccines (immunizations).   This information is not intended to replace advice given to you by your health care provider. Make sure you discuss any questions you have with your health care provider.   Document Released: 07/30/2007 Document Revised: 02/21/2014 Document Reviewed: 06/28/2010 Elsevier Interactive Patient Education [employer]

## 2015-04-07 NOTE — Assessment & Plan Note (Signed)
Refill of invokana and [place]. Checking HgA1c and foot exam done. Reminded about eye exam. He is up weight and talked to him about the need to be more consistent with diet and exercise. He is working on exercise. Taking metformin, invokana, rare glipizide, januvia. On losartan. Is on statin.

## 2015-04-07 NOTE — Progress Notes (Signed)
   Subjective:    Patient ID: Peter Keller, male    DOB: 1975-02-28, 40 y.o.   MRN: [id number]  HPI The patient is a 40 YO man coming in for wellness.   PMH, [facility], social history reviewed and updated.   Review of Systems  Constitutional: Negative for fever, activity change, appetite change and fatigue.  HENT: Negative.   Respiratory: Negative for cough, chest tightness, shortness of breath and wheezing.   Cardiovascular: Negative for chest pain, palpitations and leg swelling.  Gastrointestinal: Negative for abdominal pain, diarrhea, constipation and abdominal distention.  Endocrine: Negative.   Genitourinary: Negative.   Musculoskeletal: Negative for myalgias, arthralgias and gait problem.  Skin: Negative.  Negative for wound.  Neurological: Negative for dizziness, weakness, numbness and headaches.  Psychiatric/Behavioral: Negative.       Objective:   Physical Exam  Constitutional: He is oriented to person, place, and time. He appears well-developed and well-nourished.  Overweight  HENT:  Head: Normocephalic and atraumatic.  Eyes: EOM are normal.  Neck: Normal range of motion.  Cardiovascular: Normal rate and regular rhythm.   No murmur heard. Pulmonary/Chest: Effort normal and breath sounds normal. No respiratory distress. He has no wheezes. He has no rales.  Abdominal: Soft. Bowel sounds are normal. He exhibits no distension. There is no tenderness. There is no rebound.  Neurological: He is alert and oriented to person, place, and time.  Skin: Skin is warm and dry.  See foot exam  Vitals reviewed.  Filed Vitals:   04/07/15 1037  BP: 138/90  Pulse: 105  Temp: 98.4 F (36.9 C)  TempSrc: Oral  Resp: 18  Height:  (1.753 m)  Weight: 229 lb 6.4 oz (104.055 kg)  SpO2: 95%      Assessment & Plan:  Pneumonia 40 and flu shot given at visit.

## 2015-04-09 ENCOUNTER — Encounter: Payer: Self-pay | Admitting: Internal Medicine

## 2015-04-09 DIAGNOSIS — R945 Abnormal results of liver function studies: Principal | ICD-10-CM

## 2015-04-09 DIAGNOSIS — R7989 Other specified abnormal findings of blood chemistry: Secondary | ICD-10-CM

## 2015-04-30 ENCOUNTER — Other Ambulatory Visit: Payer: Self-pay | Admitting: Internal Medicine

## 2015-07-02 ENCOUNTER — Other Ambulatory Visit: Payer: Self-pay | Admitting: Internal Medicine

## 2015-07-02 MED ORDER — CANAGLIFLOZIN 100 MG PO TABS: 100.0000 mg | ORAL_TABLET | Freq: Every day | ORAL | Status: DC

## 2015-07-02 MED ORDER — METFORMIN HCL 1000 MG PO TABS: ORAL_TABLET | ORAL | Status: DC

## 2015-07-02 MED ORDER — SITAGLIPTIN PHOSPHATE 100 MG PO TABS: ORAL_TABLET | ORAL | Status: DC

## 2015-07-02 MED ORDER — LOSARTAN POTASSIUM-HCTZ 100-25 MG PO TABS: 1.0000 | ORAL_TABLET | Freq: Every day | ORAL | Status: DC

## 2015-10-20 ENCOUNTER — Telehealth: Payer: Self-pay

## 2015-10-20 ENCOUNTER — Ambulatory Visit (INDEPENDENT_AMBULATORY_CARE_PROVIDER_SITE_OTHER): Payer: Managed Care, Other (non HMO) | Admitting: Internal Medicine

## 2015-10-20 ENCOUNTER — Other Ambulatory Visit (INDEPENDENT_AMBULATORY_CARE_PROVIDER_SITE_OTHER): Payer: Managed Care, Other (non HMO)

## 2015-10-20 VITALS — BP 132/82 | HR 101 | Ht 67.5 in | Wt 230.0 lb

## 2015-10-20 DIAGNOSIS — E119 Type 2 diabetes mellitus without complications: Secondary | ICD-10-CM

## 2015-10-20 DIAGNOSIS — E781 Pure hyperglyceridemia: Secondary | ICD-10-CM

## 2015-10-20 DIAGNOSIS — R7401 Elevation of levels of liver transaminase levels: Secondary | ICD-10-CM

## 2015-10-20 DIAGNOSIS — E669 Obesity, unspecified: Secondary | ICD-10-CM | POA: Diagnosis not present

## 2015-10-20 DIAGNOSIS — I10 Essential (primary) hypertension: Secondary | ICD-10-CM | POA: Diagnosis not present

## 2015-10-20 DIAGNOSIS — R740 Nonspecific elevation of levels of transaminase and lactic acid dehydrogenase [LDH]: Secondary | ICD-10-CM

## 2015-10-20 LAB — COMPLETE METABOLIC PANEL WITH GFR
ALT: 75 U/L — ABNORMAL HIGH (ref 9–46)
AST: 79 U/L — ABNORMAL HIGH (ref 10–40)
Albumin: 4.4 g/dL (ref 3.6–5.1)
Alkaline Phosphatase: 69 U/L (ref 40–115)
BILIRUBIN TOTAL: 1 mg/dL (ref 0.2–1.2)
BUN: 16 mg/dL (ref 7–25)
CO2: 26 mmol/L (ref 20–31)
Calcium: 9.7 mg/dL (ref 8.6–10.3)
Chloride: 97 mmol/L — ABNORMAL LOW (ref 98–110)
Creat: 0.82 mg/dL (ref 0.60–1.35)
Glucose, Bld: 123 mg/dL — ABNORMAL HIGH (ref 65–99)
POTASSIUM: 3.9 mmol/L (ref 3.5–5.3)
Sodium: 140 mmol/L (ref 135–146)
TOTAL PROTEIN: 7.6 g/dL (ref 6.1–8.1)

## 2015-10-20 LAB — LIPID PANEL
CHOL/HDL RATIO: 4
Cholesterol: 148 mg/dL (ref 0–200)
HDL: 41 mg/dL (ref >39.00)
LDL Cholesterol: 79 mg/dL (ref 0–99)
NonHDL: 106.9
TRIGLYCERIDES: 139 mg/dL (ref 0.0–149.0)
VLDL: 27.8 mg/dL (ref 0.0–40.0)

## 2015-10-20 MED ORDER — GLIPIZIDE 5 MG PO TABS: 5.0000 mg | ORAL_TABLET | Freq: Every day | ORAL | 3 refills | Status: DC

## 2015-10-20 MED ORDER — CANAGLIFLOZIN 300 MG PO TABS: 300.0000 mg | ORAL_TABLET | Freq: Every day | ORAL | 3 refills | Status: DC

## 2015-10-20 NOTE — Telephone Encounter (Signed)
Sent lab orders over to Peter Keller.

## 2015-10-20 NOTE — Progress Notes (Signed)
Patient ID: Peter Keller, male   DOB: 05-03-1975, 40 y.o.   MRN: [id number]  HPI: Peter Keller is a 40 y.o.-year-old male, returning for follow-up for DM2, dx in 04/2013 (A1c was 11%), non-insulin-dependent, uncontrolled, without complications. Last visit 1 year ago  Last hemoglobin A1c was: Lab Results  Component Value Date   HGBA1C 6.5 04/07/2015   HGBA1C 6.3 08/29/2014   HGBA1C 6.1 03/20/2014   Pt was on a regimen of: - Metformin XR 1500 mg at night - Trulicity 1.5 mg weekly >> sugars higher closer to the next dose - Invokana 100 mg daily - Glipizide 5 mg before a large dinner Patient was initially on Janumet extended-release, which was subsequently stopped.  Amaryl was added 07/2013, but then switched to Invokana 100 mg daily in 09/2013 because of weight gain. Trulicity 0.75 mg weekly was added in 11/2013 for help with weight loss >> increased to 1.5 mg weekly in 01/2014. Metformin extended-release was started back in 03/2014 and increased to 1500 mg daily.  He is now on: - Metformin 1000 mg 2x a day - Trulicity 1.5 mg weekly. - Invokana 100 mg in am - Glipizide 5 mg at bedtime 50% of the time  Pt checks his sugars 0-1x a day and they are: - am: ave 135; but 101-140s, if forgets meds 160 >> 97, 116-151, 181, 217 (on diet, 116 and 129) >> 119, 143-167, 181 (forgot meds) - 2h after b'fast: 149 - before lunch: n/c - 2h after lunch: n/c - before dinner: n/c - 2h after dinner: 120-160 >> 121 on diet >> n/c - bedtime: n/c >> 209 while in vacation >> n/c - nighttime: n/c No lows. Lowest sugar was 97 >> 119; + hypoglycemia awareness  - ? Highest sugar was 180 >> 200s >> 181  Glucometer: One Touch  No regular exercise.  - no CKD, last BUN/creatinine:  Lab Results  Component Value Date   BUN 15 04/07/2015   CREATININE 0.83 04/07/2015   - last set of lipids: Lab Results  Component Value Date   CHOL 143 04/07/2015   HDL 38.50 (L) 04/07/2015   LDLCALC 68 04/07/2015    LDLDIRECT 122.9 12/06/2013   TRIG 185.0 (H) 04/07/2015   CHOLHDL 4 04/07/2015  Lipitor 40. - last eye exam was in 2014. No DR.  - no numbness and tingling in his feet.  ROS: Constitutional: no weight loss or gain, no fatigue, no subjective hyperthermia/hypothermia Eyes: no blurry vision, no xerophthalmia ENT: no sore throat, no nodules palpated in throat, no dysphagia/odynophagia, no hoarseness Cardiovascular: no CP/SOB/palpitations/leg swelling Respiratory: no cough/SOB Gastrointestinal: no N/V/D/C Musculoskeletal: no muscle/joint aches Skin: no rashes Neurological: no tremors/numbness/tingling/dizziness  I reviewed pt's medications, allergies, PMH, social hx, family hx, and changes were documented in the history of present illness. Otherwise, unchanged from my initial visit note.  Past Medical History:  Diagnosis Date  . Alcohol dependence (HCC)   . Diabetes (HCC)   . GERD (gastroesophageal reflux disease)   . Hepatic steatosis   . Hyperlipidemia   . Hypertension   . Obesity   . Sinus tachycardia (HCC)    Past Surgical History:  Procedure Laterality Date  . none     History   Social History  . Marital Status: Married    Spouse Name: N/A  . Number of Children: 2   Occupational History  .  Nurse, adult    Social History Main Topics  . Smoking status: Never Smoker   . Smokeless tobacco: Never  Used  . Alcohol Use: Yes     Comment: 3 alcoholic drinks daily  . Drug Use: No   Current Outpatient Prescriptions on File Prior to Visit  Medication Sig Dispense Refill  . atorvastatin (LIPITOR) 20 MG tablet TAKE 1 TABLET (20 MG TOTAL) BY MOUTH DAILY. 90 tablet 3  . buPROPion (WELLBUTRIN XL) 150 MG 24 hr tablet Take 1 tablet (150 mg total) by mouth daily. 30 tablet 6  . canagliflozin (INVOKANA) 100 MG TABS tablet Take 1 tablet (100 mg total) by mouth daily before breakfast. 90 tablet 1  . Cholecalciferol (VITAMIN D) 400 UNITS capsule Take 500 Units by mouth daily.     Marland Kitchen esomeprazole (NEXIUM) 20 MG capsule Take 20 mg by mouth daily at 12 noon.    Marland Kitchen glipiZIDE (GLUCOTROL) 5 MG tablet Take 1 tablet (5 mg total) by mouth daily with supper. **PT NEEDS FOLLOW UP APPT** 90 tablet 3  . glucose blood (ONETOUCH VERIO) test strip USE AS INSTRUCTED TO CHECK BLOOD SUGAR ONCE DAILY 50 each 3  . losartan-hydrochlorothiazide (HYZAAR) 100-25 MG tablet Take 1 tablet by mouth daily. 90 tablet 1  . metFORMIN (GLUCOPHAGE) 1000 MG tablet TAKE 1 TABLET (1,000 MG TOTAL) BY MOUTH 2 (TWO) TIMES DAILY WITH A MEAL. 180 tablet 1  . ONETOUCH DELICA LANCETS 33G MISC USE TO OBTAIN A BLOOD SPECIMEN ONCE PER DAY 100 each 4  . sitaGLIPtin (JANUVIA) 100 MG tablet TAKE 0.5 TABLETS (50 MG TOTAL) BY MOUTH DAILY. (Patient not taking: Reported on 10/20/2015) 90 tablet 1   No current facility-administered medications on file prior to visit.    No Known Allergies Family History  Problem Relation Age of Onset  . Colon cancer Neg Hx   . Hypertension Mother   . Heart disease Paternal Uncle   . Heart attack Paternal Grandmother   . Diabetes Mother   . Esophageal cancer Neg Hx   . Stomach cancer Neg Hx    PE: BP 132/82 (BP Location: Left Arm, Patient Position: Sitting)   Pulse (!) 101   Ht 5' 7.5" (1.715 m)   Wt 230 lb (104.3 kg)   SpO2 95%   BMI 35.49 kg/m  Wt Readings from Last 3 Encounters:  10/20/15 230 lb (104.3 kg)  04/07/15 229 lb 6.4 oz (104.1 kg)  10/14/14 225 lb (102.1 kg)   Constitutional: overweight, in NAD Eyes: PERRLA, EOMI, no exophthalmos ENT: moist mucous membranes, no thyromegaly, no cervical lymphadenopathy Cardiovascular: Tachycardia, RR, No MRG Respiratory: CTA B Gastrointestinal: abdomen soft, NT, ND, BS+ Musculoskeletal: no deformities, strength intact in all 4 Skin: moist, warm, no rashes Neurological: no tremor with outstretched hands, DTR normal in all 4  ASSESSMENT: 1. DM2, non-insulin-dependent, controlled, without complications  2.  Hypertriglyceridemia  3. Essential hypertension   4. Transaminitis - Right upper quadrant ultrasound from 08/07/2013 shows probable fatty infiltration, confirmed on the abdominal MRI 08/13/2013 - AST> ALT, consistent with excessive alcohol use  5. Obesity  PLAN:  1. Patient with relatively recent diabetes diagnosis, with improved control on oral antidiabetic regimen + now back on GLP-1 receptor agonist. His hemoglobin A1c has been at goal. He has very few sugars in the last 2 months (5) and only checks in the morning. These are higher than target. - He is now taking his glipizide at bedtime when necessary for high sugars >>  I advised him to move this before dinner -  will also try to increase iNVOKANA to 300 mg daily to help with both  a.m. sugars and his weight  - I suggested to:  Patient Instructions  Please continue: - Metformin 1000 mg 2x a day. - Trulicity 1.5 mg weekly - Glipizide 5 mg daily >> move this before dinner.  Please increase Invokana to 300 mg daily in am.  Please return in 3 months with your sugar log.  - Strongly advised him to continue  checking sugars  once a day,  at different times of the day, rotating checks - Again advised for yearly eye exams >> he Scheduled a new exam - Checked hemoglobin A1c today>> 6.4% (great)! Lower than expected from his meter, but he only checks sporadically and in the morning, and I suspect that his sugars are improving throughout the day.   - Return to clinic in 3 mo with sugar log   2. Hypertriglyceridemia - Triglycerides were in the 900s at the time of diagnosis, now slightly above target per check at last visit, off fenofibrate - Continue to stay off fenofibrate and continue to watch his diet  - Did not exercise as much during the summer, but he restarted - We'll check lipids today  3. Essential hypertension - He is on Hyzaar 100-25 milligrams daily and also on Invokana which can act as a diuretic - Blood pressure slightly  higher than target again, but he has whitecoat hypertension - check BMP- May need addition of Norvasc in the future   4. Transaminitis - still drinking 4 drinks of wine every night - tried to cut down, but could not maintain this - Recheck AST and ALT per his request  5. Obesity - We discussed about reducing calories - We will also increase Invokana  - time spent with the patient: 40 min, of which >50% was spent in obtaining information about his diabetes, diet, change in meds since last visit, reviewing his previous labs, evaluations, and treatments, counseling him about his diabetes and diet (please see the discussed topics above), and developing a plan to further investigate and treat them; he had a number of questions which I addressed.  Appointment on 10/20/2015  Component Date Value Ref Range Status  . Sodium 10/20/2015 140  135 - 146 mmol/L Final  . Potassium 10/20/2015 3.9  3.5 - 5.3 mmol/L Final  . Chloride 10/20/2015 97* 98 - 110 mmol/L Final  . CO2 10/20/2015 26  20 - 31 mmol/L Final  . Glucose, Bld 10/20/2015 123* 65 - 99 mg/dL Final  . BUN 16/11/9602 16  7 - 25 mg/dL Final  . Creat 54/10/8117 0.82  0.60 - 1.35 mg/dL Final  . Total Bilirubin 10/20/2015 1.0  0.2 - 1.2 mg/dL Final  . Alkaline Phosphatase 10/20/2015 69  40 - 115 U/L Final  . AST 10/20/2015 79* 10 - 40 U/L Final  . ALT 10/20/2015 75* 9 - 46 U/L Final  . Total Protein 10/20/2015 7.6  6.1 - 8.1 g/dL Final  . Albumin 14/78/2956 4.4  3.6 - 5.1 g/dL Final  . Calcium 21/30/8657 9.7  8.6 - 10.3 mg/dL Final  . GFR, Est African American 10/20/2015 >89  >=60 mL/min Final  . GFR, Est Non African American 10/20/2015 >89  >=60 mL/min Final  . Cholesterol 10/20/2015 148  0 - 200 mg/dL Final  . Triglycerides 10/20/2015 139.0  0.0 - 149.0 mg/dL Final  . HDL 84/69/6295 41.00  >39.00 mg/dL Final  . VLDL 28/41/3244 27.8  0.0 - 40.0 mg/dL Final  . LDL Cholesterol 10/20/2015 79  0 - 99 mg/dL Final  .  Total CHOL/HDL Ratio  10/20/2015 4   Final  . NonHDL 10/20/2015 106.90   Final   Lipid levels are OK.  Glu not elevated. LFTs are higher than before >> advised to avoid concentrated sweets, fatty foods and alcohol >> will repeat LFTs at next visit.  Carlus Pavlovristina Etan Vasudevan, MD PhD [facility]

## 2015-10-20 NOTE — Patient Instructions (Addendum)
Please continue: - Metformin 1000 mg 2x a day. - Trulicity 1.5 mg weekly - Glipizide 5 mg daily >> move this before dinner.  Please increase Invokana to 300 mg daily in am.  Please return in 3 months with your sugar log.

## 2015-10-21 ENCOUNTER — Encounter: Payer: Self-pay | Admitting: Internal Medicine

## 2015-10-30 ENCOUNTER — Ambulatory Visit (INDEPENDENT_AMBULATORY_CARE_PROVIDER_SITE_OTHER)
Admission: RE | Admit: 2015-10-30 | Discharge: 2015-10-30 | Disposition: A | Discharge status: Home / Self Care | Payer: Managed Care, Other (non HMO) | Source: Ambulatory Visit | Attending: Family Medicine | Admitting: Family Medicine

## 2015-10-30 ENCOUNTER — Telehealth: Payer: Self-pay | Admitting: Internal Medicine

## 2015-10-30 ENCOUNTER — Telehealth: Payer: Self-pay | Admitting: Family Medicine

## 2015-10-30 ENCOUNTER — Ambulatory Visit (INDEPENDENT_AMBULATORY_CARE_PROVIDER_SITE_OTHER): Payer: Managed Care, Other (non HMO) | Admitting: Family Medicine

## 2015-10-30 ENCOUNTER — Encounter: Payer: Self-pay | Admitting: Family Medicine

## 2015-10-30 VITALS — BP 130/80 | HR 123 | Temp 98.0°F | Ht 67.5 in | Wt 230.1 lb

## 2015-10-30 DIAGNOSIS — R0600 Dyspnea, unspecified: Secondary | ICD-10-CM

## 2015-10-30 DIAGNOSIS — R000 Tachycardia, unspecified: Secondary | ICD-10-CM | POA: Diagnosis not present

## 2015-10-30 DIAGNOSIS — R1031 Right lower quadrant pain: Secondary | ICD-10-CM

## 2015-10-30 LAB — CBC WITH DIFFERENTIAL/PLATELET
BASOS PCT: 0.3 % (ref 0.0–3.0)
Basophils Absolute: 0 10*3/uL (ref 0.0–0.1)
EOS ABS: 0.2 10*3/uL (ref 0.0–0.7)
Eosinophils Relative: 1.8 % (ref 0.0–5.0)
HEMATOCRIT: 51.7 % (ref 39.0–52.0)
LYMPHS PCT: 31.4 % (ref 12.0–46.0)
Lymphs Abs: 2.9 10*3/uL (ref 0.7–4.0)
MCHC: 34.6 g/dL (ref 30.0–36.0)
MCV: 88.8 fl (ref 78.0–100.0)
MONO ABS: 0.8 10*3/uL (ref 0.1–1.0)
Monocytes Relative: 8.7 % (ref 3.0–12.0)
Neutro Abs: 5.4 10*3/uL (ref 1.4–7.7)
Neutrophils Relative %: 57.8 % (ref 43.0–77.0)
Platelets: 205 10*3/uL (ref 150.0–400.0)
RBC: 5.83 Mil/uL — AB (ref 4.22–5.81)
RDW: 12.9 % (ref 11.5–15.5)
WBC: 9.3 10*3/uL (ref 4.0–10.5)

## 2015-10-30 LAB — POCT URINALYSIS DIPSTICK
BILIRUBIN UA: NEGATIVE
GLUCOSE UA: POSITIVE
KETONES UA: NEGATIVE
Leukocytes, UA: NEGATIVE
NITRITE UA: NEGATIVE
Protein, UA: POSITIVE
RBC UA: NEGATIVE
Urobilinogen, UA: 0.2
pH, UA: 6

## 2015-10-30 LAB — BASIC METABOLIC PANEL
BUN: 14 mg/dL (ref 6–23)
CHLORIDE: 101 meq/L (ref 96–112)
CO2: 29 mEq/L (ref 19–32)
CREATININE: 0.78 mg/dL (ref 0.40–1.50)
Calcium: 9.3 mg/dL (ref 8.4–10.5)
GFR: 117.15 mL/min (ref >60.00)
Glucose, Bld: 184 mg/dL — ABNORMAL HIGH (ref 70–99)
Potassium: 3.5 mEq/L (ref 3.5–5.1)
SODIUM: 139 meq/L (ref 135–145)

## 2015-10-30 LAB — GLUCOSE, POCT (MANUAL RESULT ENTRY): POC Glucose: 211 mg/dl — AB (ref 70–99)

## 2015-10-30 MED ORDER — IOPAMIDOL (ISOVUE-370) INJECTION 76%
100.0000 mL | Freq: Once | INTRAVENOUS | Status: AC | PRN
  Administered 2015-10-30: 100 mL via INTRAVENOUS

## 2015-10-30 NOTE — Progress Notes (Signed)
HPI:  ACUTE VISIT:  Chief Complaint  Patient presents with  . Back Pain    Saw endo last week, increased his invokana to 200 and eventually it would be 300. Back pain started after increase of dose, worse with deep breathes, no fever. kidney stone?    Mr.Thunder Chachere is a 40 y.o. male, who is here today with his wife complaining of RUQ abdominal pain, which he noted last night but not sure if he has had it for longer.  He started with right low back pain about a week ago, 2 days after his Invokana was increased to 200 mg (10/19/15). He denies any prior history of back pain. A according to patient, it was recommended by endocrinologist to hold on Invokana for now, which he did and noted improvement on back pain, now he is having "a little bit" of pain now.  He denies any foam in the urine, gross hematuria, dysuria, decreased urine output, or history of nephrolithiasis.  RUQ pain is constant with periods os exacerbation, it seems to be exacerbated by movement and by deep breathing. He denies any cough, wheezing, chest pain, or palpitations. He describes pain as mild to moderate.  He is reporting some shortness of breath with activities that require moderate to intense exertion but no dyspnea at rest or by daily activities like walking. His wife, who is a hospitalitis (Dr Isidoro Donning), is concerned about PE. VS today noted sinus tachycardia. Travel in July 2017 out of state, long driving. He denies any lower extremity edema, pain, or erythema. No history of tobacco use.  +Subjective fever, feeling "hot" and sweaty last night. He denies changes in appetite, vomiting, or changes in bowel habits. Yesterday he had mild nausea. Abdominal pain does not seem to be related to food intake. Alleviated somewhat by rest.  He denies any sick contact.  History of DM 2, according to patient, his diabetes has been fairly well controlled. According to Mr Keena, his last A1c was 6.4, he had labs done about  a week ago and according to wife, they were normal otherwsie.      Chemistry      Component Value Date/Time   NA 140 10/20/2015 1451   K 3.9 10/20/2015 1451   CL 97 (L) 10/20/2015 1451   CO2 26 10/20/2015 1451   BUN 16 10/20/2015 1451   CREATININE 0.82 10/20/2015 1451      Component Value Date/Time   CALCIUM 9.7 10/20/2015 1451   ALKPHOS 69 10/20/2015 1451   AST 79 (H) 10/20/2015 1451   ALT 75 (H) 10/20/2015 1451   BILITOT 1.0 10/20/2015 1451      Lab Results  Component Value Date   HGBA1C 6.5 04/07/2015     Review of Systems  Constitutional: Positive for fatigue and fever. Negative for appetite change and unexpected weight change.  HENT: Negative for congestion, facial swelling, mouth sores, nosebleeds, sore throat and trouble swallowing.   Eyes: Negative for redness and visual disturbance.  Respiratory: Positive for shortness of breath. Negative for cough, chest tightness and wheezing.   Cardiovascular: Negative for palpitations and leg swelling.  Gastrointestinal: Positive for abdominal pain and nausea. Negative for blood in stool and vomiting.       No changes in bowel habits  Endocrine: Negative for polydipsia, polyphagia and polyuria.  Genitourinary: Negative for decreased urine volume, dysuria, frequency and hematuria.  Musculoskeletal: Positive for back pain. Negative for joint swelling, myalgias and neck pain.  Skin: Negative for  pallor and rash.  Neurological: Negative for syncope, weakness and headaches.  Hematological: Negative for adenopathy. Does not bruise/bleed easily.  Psychiatric/Behavioral: Negative for confusion. The patient is not nervous/anxious.       Current Outpatient Prescriptions on File Prior to Visit  Medication Sig Dispense Refill  . atorvastatin (LIPITOR) 20 MG tablet TAKE 1 TABLET (20 MG TOTAL) BY MOUTH DAILY. 90 tablet 3  . buPROPion (WELLBUTRIN XL) 150 MG 24 hr tablet Take 1 tablet (150 mg total) by mouth daily. 30 tablet 6  .  canagliflozin (INVOKANA) 300 MG TABS tablet Take 1 tablet (300 mg total) by mouth daily before breakfast. 90 tablet 3  . Cholecalciferol (VITAMIN D) 400 UNITS capsule Take 500 Units by mouth daily.    Marland Kitchen. esomeprazole (NEXIUM) 20 MG capsule Take 20 mg by mouth daily at 12 noon.    Marland Kitchen. glipiZIDE (GLUCOTROL) 5 MG tablet Take 1 tablet (5 mg total) by mouth daily with supper. 90 tablet 3  . glucose blood (ONETOUCH VERIO) test strip USE AS INSTRUCTED TO CHECK BLOOD SUGAR ONCE DAILY 50 each 3  . losartan-hydrochlorothiazide (HYZAAR) 100-25 MG tablet Take 1 tablet by mouth daily. 90 tablet 1  . metFORMIN (GLUCOPHAGE) 1000 MG tablet TAKE 1 TABLET (1,000 MG TOTAL) BY MOUTH 2 (TWO) TIMES DAILY WITH A MEAL. 180 tablet 1  . ONETOUCH DELICA LANCETS 33G MISC USE TO OBTAIN A BLOOD SPECIMEN ONCE PER DAY 100 each 4  . TRULICITY 1.5 MG/0.5ML SOPN      No current facility-administered medications on file prior to visit.      Past Medical History:  Diagnosis Date  . Alcohol dependence (HCC)   . Diabetes (HCC)   . GERD (gastroesophageal reflux disease)   . Hepatic steatosis   . Hyperlipidemia   . Hypertension   . Obesity   . Sinus tachycardia (HCC)    No Known Allergies  Social History   Social History  . Marital status: Married    Spouse name: N/A  . Number of children: N/A  . Years of education: N/A   Social History Main Topics  . Smoking status: Never Smoker  . Smokeless tobacco: Never Used  . Alcohol use Yes     Comment: 3 alcoholic drinks daily  . Drug use: No  . Sexual activity: Not Asked   Other Topics Concern  . None   Social History Narrative  . None    Vitals:   10/30/15 1114  BP: 130/80  Pulse: (!) 123  Temp: 98 F (36.7 C)   O2 sat 96% at RA.  Body mass index is 35.51 kg/m.    Physical Exam  Nursing note and vitals reviewed. Constitutional: He is oriented to person, place, and time. He appears well-developed. He does not appear ill. He appears distressed (mild).    HENT:  Head: Atraumatic.  Mouth/Throat: Oropharynx is clear and moist. Mucous membranes are dry.  Eyes: Conjunctivae and EOM are normal.  Cardiovascular: Regular rhythm.  Tachycardia present.   No murmur heard. Pulses:      Dorsalis pedis pulses are 2+ on the right side, and 2+ on the left side.  HR by my count 120/min  Respiratory: Effort normal and breath sounds normal. No respiratory distress.  GI: Soft. Bowel sounds are normal. He exhibits no mass. There is no hepatomegaly. There is tenderness in the right lower quadrant and suprapubic area. There is guarding (RLQ) and tenderness at McBurney's point. There is no rebound, no CVA tenderness and negative  Murphy's sign.  Pain with palpation mainly RLQ, it also causes pain palpation on suprapubic area and some RLQ discomfort with palpation of LLQ. Also mild pain on RLQ when ask to cough.   Musculoskeletal: He exhibits no edema or tenderness.       Right lower leg: He exhibits no tenderness.       Left lower leg: He exhibits no tenderness.  Lymphadenopathy:    He has no cervical adenopathy.  Neurological: He is alert and oriented to person, place, and time. He has normal strength. No cranial nerve deficit. Coordination and gait normal.  Skin: Skin is warm. No rash noted. No erythema.  Psychiatric: He has a normal mood and affect. His speech is normal.  Well groomed, good eye contact.      ASSESSMENT AND PLAN:     Tallin was seen today for back pain.  Diagnoses and all orders for this visit:  Sinus tachycardia (HCC)   Improved, at the time of the discharge HR 112/min. It could be related to dehydration. Other possible etiologies discussed with patient and his wife, who is concerned about PE. EKG done today I don't see acute ischemia, mild sinus tachycardia. No major changes when compared with prior EKG (07/11/13). He had exercise stress test 07/2013, according to pt, it was negative.   -     EKG 12-Lead   -     Basic  Metabolic Panel -     CBC with Differential/Platelet  RLQ abdominal pain  Pain seems to be localized on RLQ. We discussed possible causes, I am concerned about possibility of acute appendicitis or other intraabdominal process, so abdominal/pelvic CT w/o contrast was ordered stat.  Urine dipstick today: no ketones, GLU 3+. Noted mildly elevated transaminases in recent labs, FLP could be repeated in a couple months by PCP.  Recommend avoiding food intake and the abdominal CT is done. Further recommendations would be given according to lab results.        -     CT ABDOMEN PELVIS W CONTRAST; Future -     POC Urinalysis Dipstick -     POC Glucose (CBG) -     Basic Metabolic Panel -     CBC with Differential/Platelet  Dyspnea  Lung auscultation otherwise negative.  Explained that sometimes perception of shortness of breath can be also related to shallow breathing due to pain. Because a concern about PE was raised as well as sinus tachycardia a chest CTA was also arranged stat.    -     EKG 12-Lead       -     CT ANGIO CHEST PE W OR WO CONTRAST; Future        Return if symptoms worsen or fail to improve, for depending of results today, he may need f/u with PCP.     -Mr.Bayard Kolar left to have imaging done, appt at 1 pm.       Kataya Guimont G. [place], MD  [facility]. [place] office.

## 2015-10-30 NOTE — Patient Instructions (Signed)
  [name] I have seen you today for an acute visit.   1. Sinus tachycardia (HCC)  - EKG 12-Lead - CT ABDOMEN PELVIS W CONTRAST; Future - CT ANGIO CHEST PE W OR WO CONTRAST; Future  2. RLQ abdominal pain  - POC Urinalysis Dipstick - POC Glucose (CBG)  3. Dyspnea  - EKG 12-Lead - CT ABDOMEN PELVIS W CONTRAST; Future - CT ANGIO CHEST PE W OR WO CONTRAST; Future    Monitor for signs of worsening symptoms and seek immediate medical attention if any concerning/warning symptom as we discussed.   If symptoms are not resolved in 1-2 weeks you should schedule a follow up appointment with your doctor, before if symptoms get worse.  Please be sure you have an appointment already scheduled with your PCP.

## 2015-10-31 ENCOUNTER — Encounter: Payer: Self-pay | Admitting: Family Medicine

## 2015-11-02 NOTE — Telephone Encounter (Signed)
error 

## 2015-11-23 ENCOUNTER — Other Ambulatory Visit: Payer: Self-pay | Admitting: Internal Medicine

## 2015-12-26 ENCOUNTER — Other Ambulatory Visit: Payer: Self-pay | Admitting: Internal Medicine

## 2015-12-30 ENCOUNTER — Other Ambulatory Visit: Payer: Self-pay | Admitting: Internal Medicine

## 2015-12-30 MED ORDER — ONETOUCH VERIO IQ SYSTEM W/DEVICE KIT: PACK | 0 refills | Status: DC

## 2016-02-18 ENCOUNTER — Other Ambulatory Visit: Payer: Self-pay | Admitting: Internal Medicine

## 2016-02-18 ENCOUNTER — Telehealth: Payer: Self-pay | Admitting: Internal Medicine

## 2016-02-18 DIAGNOSIS — IMO0001 Reserved for inherently not codable concepts without codable children: Secondary | ICD-10-CM

## 2016-02-18 DIAGNOSIS — E1165 Type 2 diabetes mellitus with hyperglycemia: Principal | ICD-10-CM

## 2016-02-18 MED ORDER — TRULICITY 1.5 MG/0.5ML ~~LOC~~ SOAJ: SUBCUTANEOUS | 3 refills | Status: DC

## 2016-02-18 NOTE — Telephone Encounter (Signed)
Getting the okay from Dr.Gherghe to send in medication.

## 2016-02-18 NOTE — Telephone Encounter (Signed)
Pt is asking for refills on the trulicity to be called into [facility]

## 2016-03-16 ENCOUNTER — Other Ambulatory Visit: Payer: Self-pay | Admitting: Internal Medicine

## 2016-03-16 DIAGNOSIS — E119 Type 2 diabetes mellitus without complications: Secondary | ICD-10-CM

## 2016-03-16 MED ORDER — FREESTYLE LIBRE READER DEVI: 1.0000 | Freq: Every day | 1 refills | Status: DC

## 2016-03-16 MED ORDER — FREESTYLE LIBRE SENSOR SYSTEM MISC: 1.0000 | 11 refills | Status: DC

## 2016-03-23 ENCOUNTER — Other Ambulatory Visit: Payer: Self-pay | Admitting: Internal Medicine

## 2016-04-07 ENCOUNTER — Ambulatory Visit (INDEPENDENT_AMBULATORY_CARE_PROVIDER_SITE_OTHER): Payer: Managed Care, Other (non HMO) | Admitting: Internal Medicine

## 2016-04-07 ENCOUNTER — Encounter: Payer: Self-pay | Admitting: Internal Medicine

## 2016-04-07 ENCOUNTER — Other Ambulatory Visit: Payer: Self-pay | Admitting: Internal Medicine

## 2016-04-07 VITALS — BP 150/84 | HR 106 | Temp 98.2°F | Ht 67.5 in | Wt 224.8 lb

## 2016-04-07 DIAGNOSIS — Z23 Encounter for immunization: Secondary | ICD-10-CM | POA: Diagnosis not present

## 2016-04-07 DIAGNOSIS — Z0000 Encounter for general adult medical examination without abnormal findings: Secondary | ICD-10-CM

## 2016-04-07 DIAGNOSIS — E6609 Other obesity due to excess calories: Secondary | ICD-10-CM | POA: Diagnosis not present

## 2016-04-07 DIAGNOSIS — Z6834 Body mass index (BMI) 34.0-34.9, adult: Secondary | ICD-10-CM | POA: Diagnosis not present

## 2016-04-07 DIAGNOSIS — I10 Essential (primary) hypertension: Secondary | ICD-10-CM | POA: Diagnosis not present

## 2016-04-07 DIAGNOSIS — E785 Hyperlipidemia, unspecified: Secondary | ICD-10-CM | POA: Diagnosis not present

## 2016-04-07 DIAGNOSIS — E119 Type 2 diabetes mellitus without complications: Secondary | ICD-10-CM

## 2016-04-07 DIAGNOSIS — K760 Fatty (change of) liver, not elsewhere classified: Secondary | ICD-10-CM | POA: Diagnosis not present

## 2016-04-07 MED ORDER — METFORMIN HCL 1000 MG PO TABS: ORAL_TABLET | ORAL | 3 refills | Status: DC

## 2016-04-07 MED ORDER — LOSARTAN POTASSIUM-HCTZ 100-25 MG PO TABS: 1.0000 | ORAL_TABLET | Freq: Every day | ORAL | 3 refills | Status: DC

## 2016-04-07 NOTE — Assessment & Plan Note (Signed)
Tdap and flu shot given today. Eye exam up to date (need records). Declines need for hiv screening. Not exercising regularly and we counseled about that. We also counseled about the need to cut back on alcohol some with the concurrent fatty liver diagnosis. Counseled on dangers of distracted driving and sun safety. Given screening recommendations.

## 2016-04-07 NOTE — Assessment & Plan Note (Signed)
Checking HgA1c, foot exam done. Last HgA1c at goal on his trulicity, metformin, invokana. Uses continuous glucose monitoring and takes glipizide only if sugars are high. Not complicated. Adjust as needed.

## 2016-04-07 NOTE — Assessment & Plan Note (Signed)
He is working on weight loss and down about 10 pounds from peak weight. Goal is around 200 pounds. Checking CMP for stability as last liver panel was elevated from baseline. CT confirmed fatty liver disease.

## 2016-04-07 NOTE — Progress Notes (Signed)
   Subjective:    Patient ID: Peter Keller, male    DOB: 07/04/1975, 41 y.o.   MRN: [id number]  HPI The patient is a 41 YO man coming in for wellness. Not exercising consistently but working on weight loss.   [facility], social history reviewed and updated.   Review of Systems  Constitutional: Negative.   HENT: Negative.   Eyes: Negative.   Respiratory: Negative for cough, chest tightness and shortness of breath.   Cardiovascular: Negative for chest pain, palpitations and leg swelling.  Gastrointestinal: Negative for abdominal distention, abdominal pain, constipation, diarrhea, nausea and vomiting.  Musculoskeletal: Negative.   Skin: Negative.   Neurological: Negative.   Psychiatric/Behavioral: Negative.       Objective:   Physical Exam  Constitutional: He is oriented to person, place, and time. He appears well-developed and well-nourished.  Overweight  HENT:  Head: Normocephalic and atraumatic.  Eyes: EOM are normal.  Neck: Normal range of motion.  Cardiovascular: Normal rate and regular rhythm.   Pulmonary/Chest: Effort normal and breath sounds normal. No respiratory distress. He has no wheezes. He has no rales.  Abdominal: Soft. Bowel sounds are normal. He exhibits no distension. There is no tenderness. There is no rebound.  Musculoskeletal: He exhibits no edema.  Neurological: He is alert and oriented to person, place, and time. Coordination normal.  Skin: Skin is warm and dry.  Psychiatric: He has a normal mood and affect.   Vitals:   04/07/16 0832 04/07/16 0929  BP: (!) 168/112 (!) 150/84  Pulse: (!) 106   Temp: 98.2 F (36.8 C)   TempSrc: Oral   SpO2: 98%   Weight: 224 lb 12 oz (101.9 kg)   Height: 5' 7.5" (1.715 m)       Assessment & Plan:  Tetanus and flu shot given at visit.

## 2016-04-07 NOTE — Assessment & Plan Note (Signed)
At goal of LDL <100 on lipitor 20 mg daily.

## 2016-04-07 NOTE — Patient Instructions (Signed)
We can send in the medication refills.   We will check the labs so you can come back fasting some morning to the basement for the labs.   Health Maintenance, Male A healthy lifestyle and preventative care can promote health and wellness.  Maintain regular health, dental, and eye exams.  Eat a healthy diet. Foods like vegetables, fruits, whole grains, low-fat dairy products, and lean protein foods contain the nutrients you need and are low in calories. Decrease your intake of foods high in solid fats, added sugars, and salt. Get information about a proper diet from your health care provider, if necessary.  Regular physical exercise is one of the most important things you can do for your health. Most adults should get at least 150 minutes of moderate-intensity exercise (any activity that increases your heart rate and causes you to sweat) each week. In addition, most adults need muscle-strengthening exercises on 2 or more days a week.   Maintain a healthy weight. The body mass index (BMI) is a screening tool to identify possible weight problems. It provides an estimate of body fat based on height and weight. Your health care provider can find your BMI and can help you achieve or maintain a healthy weight. For males 20 years and older:  A BMI below 18.5 is considered underweight.  A BMI of 18.5 to 24.9 is normal.  A BMI of 25 to 29.9 is considered overweight.  A BMI of 30 and above is considered obese.  Maintain normal blood lipids and cholesterol by exercising and minimizing your intake of saturated fat. Eat a balanced diet with plenty of fruits and vegetables. Blood tests for lipids and cholesterol should begin at age 41 and be repeated every 5 years. If your lipid or cholesterol levels are high, you are over age 41, or you are at high risk for heart disease, you may need your cholesterol levels checked more frequently.Ongoing high lipid and cholesterol levels should be treated with medicines  if diet and exercise are not working.  If you smoke, find out from your health care provider how to quit. If you do not use tobacco, do not start.  Lung cancer screening is recommended for adults aged 55-80 years who are at high risk for developing lung cancer because of a history of smoking. A yearly low-dose CT scan of the lungs is recommended for people who have at least a 30-pack-year history of smoking and are current smokers or have quit within the past 15 years. A pack year of smoking is smoking an average of 1 pack of cigarettes a day for 1 year (for example, a 30-pack-year history of smoking could mean smoking 1 pack a day for 30 years or 2 packs a day for 15 years). Yearly screening should continue until the smoker has stopped smoking for at least 15 years. Yearly screening should be stopped for people who develop a health problem that would prevent them from having lung cancer treatment.  If you choose to drink alcohol, do not have more than 2 drinks per day. One drink is considered to be 12 oz (360 mL) of beer, 5 oz (150 mL) of wine, or 1.5 oz (45 mL) of liquor.  Avoid the use of street drugs. Do not share needles with anyone. Ask for help if you need support or instructions about stopping the use of drugs.  High blood pressure causes heart disease and increases the risk of stroke. High blood pressure is more likely to develop in:  People who have blood pressure in the end of the normal range (100-139/85-89 mm Hg).  People who are overweight or obese.  People who are African American.  If you are 46-2 years of age, have your blood pressure checked every 3-5 years. If you are 39 years of age or older, have your blood pressure checked every year. You should have your blood pressure measured twice-once when you are at a hospital or clinic, and once when you are not at a hospital or clinic. Record the average of the two measurements. To check your blood pressure when you are not at a  hospital or clinic, you can use:  An automated blood pressure machine at a pharmacy.  A home blood pressure monitor.  If you are 26-74 years old, ask your health care provider if you should take aspirin to prevent heart disease.  Diabetes screening involves taking a blood sample to check your fasting blood sugar level. This should be done once every 3 years after age 68 if you are at a normal weight and without risk factors for diabetes. Testing should be considered at a younger age or be carried out more frequently if you are overweight and have at least 1 risk factor for diabetes.  Colorectal cancer can be detected and often prevented. Most routine colorectal cancer screening begins at the age of 75 and continues through age 25. However, your health care provider may recommend screening at an earlier age if you have risk factors for colon cancer. On a yearly basis, your health care provider may provide home test kits to check for hidden blood in the stool. A small camera at the end of a tube may be used to directly examine the colon (sigmoidoscopy or colonoscopy) to detect the earliest forms of colorectal cancer. Talk to your health care provider about this at age 95 when routine screening begins. A direct exam of the colon should be repeated every 5-10 years through age 71, unless early forms of precancerous polyps or small growths are found.  People who are at an increased risk for hepatitis B should be screened for this virus. You are considered at high risk for hepatitis B if:  You were born in a country where hepatitis B occurs often. Talk with your health care provider about which countries are considered high risk.  Your parents were born in a high-risk country and you have not received a shot to protect against hepatitis B (hepatitis B vaccine).  You have HIV or AIDS.  You use needles to inject street drugs.  You live with, or have sex with, someone who has hepatitis B.  You are a  man who has sex with other men (MSM).  You get hemodialysis treatment.  You take certain medicines for conditions like cancer, organ transplantation, and autoimmune conditions.  Hepatitis C blood testing is recommended for all people born from [birth year] through 1965 and any individual with known risk factors for hepatitis C.  Healthy men should no longer receive prostate-specific antigen (PSA) blood tests as part of routine cancer screening. Talk to your health care provider about prostate cancer screening.  Testicular cancer screening is not recommended for adolescents or adult males who have no symptoms. Screening includes self-exam, a health care provider exam, and other screening tests. Consult with your health care provider about any symptoms you have or any concerns you have about testicular cancer.  Practice safe sex. Use condoms and avoid high-risk sexual practices to reduce the spread of  sexually transmitted infections (STIs).  You should be screened for STIs, including gonorrhea and chlamydia if:  You are sexually active and are younger than 24 years.  You are older than 24 years, and your health care provider tells you that you are at risk for this type of infection.  Your sexual activity has changed since you were last screened, and you are at an increased risk for chlamydia or gonorrhea. Ask your health care provider if you are at risk.  If you are at risk of being infected with HIV, it is recommended that you take a prescription medicine daily to prevent HIV infection. This is called pre-exposure prophylaxis (PrEP). You are considered at risk if:  You are a man who has sex with other men (MSM).  You are a heterosexual man who is sexually active with multiple partners.  You take drugs by injection.  You are sexually active with a partner who has HIV.  Talk with your health care provider about whether you are at high risk of being infected with HIV. If you choose to begin PrEP,  you should first be tested for HIV. You should then be tested every 3 months for as long as you are taking PrEP.  Use sunscreen. Apply sunscreen liberally and repeatedly throughout the day. You should seek shade when your shadow is shorter than you. Protect yourself by wearing long sleeves, pants, a wide-brimmed hat, and sunglasses year round whenever you are outdoors.  Tell your health care provider of new moles or changes in moles, especially if there is a change in shape or color. Also, tell your health care provider if a mole is larger than the size of a pencil eraser.  A one-time screening for abdominal aortic aneurysm (AAA) and surgical repair of large AAAs by ultrasound is recommended for men aged 97-75 years who are current or former smokers.  Stay current with your vaccines (immunizations). This information is not intended to replace advice given to you by your health care provider. Make sure you discuss any questions you have with your health care provider. Document Released: 07/30/2007 Document Revised: 02/21/2014 Document Reviewed: 11/04/2014 Elsevier Interactive Patient Education  2017 [employer].

## 2016-04-07 NOTE — Assessment & Plan Note (Signed)
BP elevated today although this is unusual for the patient. He would like to monitor at home and change only if needed. Taking losartan/hctz without complications.

## 2016-04-07 NOTE — Assessment & Plan Note (Signed)
Weight is going down and he is working on goal weight of 200 pounds.

## 2016-04-07 NOTE — Progress Notes (Signed)
Pre visit review using our clinic review tool, if applicable. No additional management support is needed unless otherwise documented below in the visit note. 

## 2016-05-25 ENCOUNTER — Other Ambulatory Visit: Payer: Self-pay | Admitting: Internal Medicine

## 2016-05-29 ENCOUNTER — Other Ambulatory Visit: Payer: Self-pay | Admitting: Internal Medicine

## 2016-05-30 NOTE — Telephone Encounter (Signed)
Sent in

## 2016-05-30 NOTE — Telephone Encounter (Signed)
Pease advise, patient got year supply last September, and Dr. Okey Dupre did not prescribe

## 2016-08-20 ENCOUNTER — Other Ambulatory Visit: Payer: Self-pay | Admitting: Internal Medicine

## 2016-08-23 ENCOUNTER — Other Ambulatory Visit: Payer: Self-pay | Admitting: Internal Medicine

## 2016-10-18 ENCOUNTER — Other Ambulatory Visit: Payer: Self-pay | Admitting: Family

## 2016-10-22 ENCOUNTER — Other Ambulatory Visit: Payer: Self-pay | Admitting: Internal Medicine

## 2016-11-16 ENCOUNTER — Other Ambulatory Visit: Payer: Self-pay | Admitting: Family

## 2016-12-27 ENCOUNTER — Other Ambulatory Visit: Payer: Self-pay | Admitting: Internal Medicine

## 2017-01-15 ENCOUNTER — Other Ambulatory Visit: Payer: Self-pay | Admitting: Internal Medicine

## 2017-01-16 ENCOUNTER — Other Ambulatory Visit: Payer: Self-pay | Admitting: Internal Medicine

## 2017-01-16 NOTE — Telephone Encounter (Signed)
Is this okay to refill? 

## 2017-01-16 NOTE — Telephone Encounter (Signed)
Medication sent.

## 2017-01-16 NOTE — Telephone Encounter (Signed)
yes

## 2017-01-23 ENCOUNTER — Ambulatory Visit: Payer: Managed Care, Other (non HMO) | Admitting: Internal Medicine

## 2017-01-25 ENCOUNTER — Ambulatory Visit (INDEPENDENT_AMBULATORY_CARE_PROVIDER_SITE_OTHER): Payer: Managed Care, Other (non HMO) | Admitting: Internal Medicine

## 2017-01-25 ENCOUNTER — Encounter: Payer: Self-pay | Admitting: Internal Medicine

## 2017-01-25 VITALS — BP 150/90 | HR 94 | Ht 68.0 in | Wt 221.0 lb

## 2017-01-25 DIAGNOSIS — R002 Palpitations: Secondary | ICD-10-CM

## 2017-01-25 DIAGNOSIS — I472 Ventricular tachycardia, unspecified: Secondary | ICD-10-CM

## 2017-01-25 DIAGNOSIS — R0600 Dyspnea, unspecified: Secondary | ICD-10-CM

## 2017-01-25 DIAGNOSIS — I10 Essential (primary) hypertension: Secondary | ICD-10-CM | POA: Diagnosis not present

## 2017-01-25 NOTE — Progress Notes (Signed)
HPI [name] is referred today by his wife, Dr. Tana Keller for evaluation of palpitations, high blood pressure, documented tachycardia, and difficulty controlling his weight.  He has a 41 year old man with diabetes and hypertension who has had trouble with palpitations in the past.  He has had documented heart rates on his apple phone of over 140 bpm.  He has never had syncope.  He notes dyspnea with exertion.  He has become sedentary.  Previously he lifted weights.  The patient describes an episode where he was skiing and had 2 cups of coffee and documented heart rates in the 140-150 range.  We do not have any ECGs of his tachycardia, but he does have documented sinus tachycardia on routine ECG. No Known Allergies   Current Outpatient Medications  Medication Sig Dispense Refill  . atorvastatin (LIPITOR) 20 MG tablet TAKE ONE TABLET BY MOUTH DAILY 90 tablet 1  . Blood Glucose Monitoring Suppl (ONETOUCH VERIO IQ SYSTEM) w/Device KIT Use as directed 1 kit 0  . buPROPion (WELLBUTRIN XL) 150 MG 24 hr tablet TAKE 1 TABLET (150 MG TOTAL) BY MOUTH DAILY. 30 tablet 3  . Cholecalciferol (VITAMIN D) 400 UNITS capsule Take 500 Units by mouth daily.    . Continuous Blood Gluc Receiver (FREESTYLE LIBRE READER) DEVI 1 Device by Does not apply route daily. 1 Device 1  . Continuous Blood Gluc Sensor (FREESTYLE LIBRE SENSOR SYSTEM) MISC 1 Device by Does not apply route every 30 (thirty) days. 3 each 11  . esomeprazole (NEXIUM) 20 MG capsule Take 20 mg by mouth daily at 12 noon.    Marland Kitchen glipiZIDE (GLUCOTROL) 5 MG tablet TAKE 1 TABLET (5 MG TOTAL) BY MOUTH DAILY WITH SUPPER. **PT NEEDS FOLLOW UP APPT** 30 tablet 2  . INVOKANA 300 MG TABS tablet TAKE ONE TABLET BY MOUTH DAILY BEFORE BREAKFAST 30 tablet 0  . losartan-hydrochlorothiazide (HYZAAR) 100-25 MG tablet Take 1 tablet by mouth daily. 90 tablet 3  . metFORMIN (GLUCOPHAGE) 1000 MG tablet TAKE ONE TABLET BY MOUTH TWICE A DAY WITH A MEAL 180 tablet 3  . ONETOUCH  DELICA LANCETS 55V MISC USE TO OBTAIN A BLOOD SPECIMEN ONCE PER DAY 100 each 0  . ONETOUCH VERIO test strip USE AS INSTRUCTED TO CHECK BLOOD SUGAR ONCE DAILY 50 each 2  . TRULICITY 1.5 ZS/8.2LM SOPN INJECT 1.5 MG UNDER SKIN WEEKLY 2 pen 0   No current facility-administered medications for this visit.      Past Medical History:  Diagnosis Date  . Alcohol dependence ([place])   . Diabetes ([place])   . GERD (gastroesophageal reflux disease)   . Hepatic steatosis   . Hyperlipidemia   . Hypertension   . Obesity   . Sinus tachycardia     ROS:   All systems reviewed and negative except as noted in the HPI.   Past Surgical History:  Procedure Laterality Date  . none       Family History  Problem Relation Age of Onset  . Hypertension Mother   . Diabetes Mother   . Heart disease Paternal Uncle   . Heart attack Paternal Grandmother   . Colon cancer Neg Hx   . Esophageal cancer Neg Hx   . Stomach cancer Neg Hx      Social History   Socioeconomic History  . Marital status: Married    Spouse name: Not on file  . Number of children: Not on file  . Years of education: Not on file  .  Highest education level: Not on file  Social Needs  . Financial resource strain: Not on file  . Food insecurity - worry: Not on file  . Food insecurity - inability: Not on file  . Transportation needs - medical: Not on file  . Transportation needs - non-medical: Not on file  Occupational History  . Not on file  Tobacco Use  . Smoking status: Never Smoker  . Smokeless tobacco: Never Used  Substance and Sexual Activity  . Alcohol use: Yes    Comment: 3 alcoholic drinks daily  . Drug use: No  . Sexual activity: Not on file  Other Topics Concern  . Not on file  Social History Narrative  . Not on file     BP (!) 150/90   Pulse 94   Ht '5\' 8"'$  (1.727 m)   Wt 221 lb (100.2 kg)   BMI 33.60 kg/m   Physical Exam:  Well appearing 41 year old man, overweight, NAD HEENT: Unremarkable Neck: 7  cm, with no wheezes, rales, or rhonchi JVD, no thyromegally Lymphatics:  No adenopathy Back:  No CVA tenderness Lungs:  Clear HEART:  Regular rate rhythm, no murmurs, no rubs, no clicks Abd:  soft, positive bowel sounds, no organomegally, no rebound, no guarding Ext:  2 plus pulses, no edema, no cyanosis, no clubbing Skin:  No rashes no nodules Neuro:  CN II through XII intact, motor grossly intact  EKG -normal sinus rhythm at 96 bpm.  Assess/Plan: 1.  Palpitations -the mechanism of his palpitations is likely sinus tachycardia.  I have recommended that he wear a 48-hour Holter to help [place] confirm this.  He could have SVT although his history makes this less likely. 2.  Hypertension -his blood pressure is elevated today.  He took atenolol in addition to Hyzaar earlier today.  We will discuss additional antihypertensive therapies going forward. 3.  Overweight -we discussed the importance of weight loss and exercise.  He is not doing any aerobic activity.  My plan is to have the patient go on a diet as well as an exercise program after his Holter monitor and 2D echo.  I might consider a stress test as well.  Peter Sickles, MD

## 2017-01-25 NOTE — Patient Instructions (Addendum)
Medication Instructions:  Your physician recommends that you continue on your current medications as directed. Please refer to the Current Medication list given to you today.  Labwork: None ordered.  Testing/Procedures: Your physician has requested that you have an echocardiogram. Echocardiography is a painless test that uses sound waves to create images of your heart. It provides your doctor with information about the size and shape of your heart and how well your heart's chambers and valves are working. This procedure takes approximately one hour. There are no restrictions for this procedure.  Your physician has recommended that you wear a holter monitor. Holter monitors are medical devices that record the heart's electrical activity. Doctors most often use these monitors to diagnose arrhythmias. Arrhythmias are problems with the speed or rhythm of the heartbeat. The monitor is a small, portable device. You can wear one while you do your normal daily activities. This is usually used to diagnose what is causing palpitations/syncope (passing out).  Please schedule for a 48 hour holter monitor.  Please schedule for an ECHO.  Follow-Up: Your physician wants you to follow-up based on results of the above tests.  Any Other Special Instructions Will Be Listed Below (If Applicable).  Do NOT take your atenolol while you are wearing the 48 hour holter monitor.  Do not have more than one alcohol drink a day.

## 2017-01-26 NOTE — Addendum Note (Signed)
Addended by: Kerrie BuffaloANIELS, MICHELE on: 01/26/2017 11:33 AM   Modules accepted: Orders

## 2017-01-30 ENCOUNTER — Encounter: Payer: Self-pay | Admitting: Internal Medicine

## 2017-01-30 ENCOUNTER — Ambulatory Visit: Payer: Managed Care, Other (non HMO) | Admitting: Internal Medicine

## 2017-01-30 VITALS — BP 150/108 | HR 94 | Ht 68.5 in | Wt 219.6 lb

## 2017-01-30 DIAGNOSIS — I10 Essential (primary) hypertension: Secondary | ICD-10-CM

## 2017-01-30 DIAGNOSIS — E119 Type 2 diabetes mellitus without complications: Secondary | ICD-10-CM

## 2017-01-30 DIAGNOSIS — Z23 Encounter for immunization: Secondary | ICD-10-CM

## 2017-01-30 DIAGNOSIS — E785 Hyperlipidemia, unspecified: Secondary | ICD-10-CM | POA: Diagnosis not present

## 2017-01-30 DIAGNOSIS — R740 Nonspecific elevation of levels of transaminase and lactic acid dehydrogenase [LDH]: Secondary | ICD-10-CM

## 2017-01-30 DIAGNOSIS — R7401 Elevation of levels of liver transaminase levels: Secondary | ICD-10-CM | POA: Insufficient documentation

## 2017-01-30 LAB — COMPLETE METABOLIC PANEL WITH GFR
AG RATIO: 1.4 (calc) (ref 1.0–2.5)
ALKALINE PHOSPHATASE (APISO): 59 U/L (ref 40–115)
ALT: 91 U/L — AB (ref 9–46)
AST: 113 U/L — ABNORMAL HIGH (ref 10–40)
Albumin: 4.6 g/dL (ref 3.6–5.1)
BUN: 11 mg/dL (ref 7–25)
CALCIUM: 9.7 mg/dL (ref 8.6–10.3)
CO2: 26 mmol/L (ref 20–32)
CREATININE: 0.65 mg/dL (ref 0.60–1.35)
Chloride: 104 mmol/L (ref 98–110)
GFR, EST NON AFRICAN AMERICAN: 121 mL/min/{1.73_m2} (ref >=60)
GFR, Est African American: 140 mL/min/{1.73_m2} (ref >=60)
Globulin: 3.2 g/dL (calc) (ref 1.9–3.7)
Glucose, Bld: 108 mg/dL — ABNORMAL HIGH (ref 65–99)
POTASSIUM: 4 mmol/L (ref 3.5–5.3)
Sodium: 142 mmol/L (ref 135–146)
Total Bilirubin: 0.7 mg/dL (ref 0.2–1.2)
Total Protein: 7.8 g/dL (ref 6.1–8.1)

## 2017-01-30 LAB — LIPID PANEL
CHOLESTEROL: 169 mg/dL (ref 0–200)
HDL: 46.2 mg/dL (ref >39.00)
NonHDL: 123.28
TRIGLYCERIDES: 286 mg/dL — AB (ref 0.0–149.0)
Total CHOL/HDL Ratio: 4
VLDL: 57.2 mg/dL — ABNORMAL HIGH (ref 0.0–40.0)

## 2017-01-30 LAB — MICROALBUMIN / CREATININE URINE RATIO
CREATININE, U: 85.6 mg/dL
MICROALB/CREAT RATIO: 7.2 mg/g (ref 0.0–30.0)
Microalb, Ur: 6.2 mg/dL — ABNORMAL HIGH (ref 0.0–1.9)

## 2017-01-30 LAB — CBC
HCT: 51.3 % (ref 39.0–52.0)
HEMOGLOBIN: 17.8 g/dL — AB (ref 13.0–17.0)
MCHC: 34.6 g/dL (ref 30.0–36.0)
MCV: 92.6 fl (ref 78.0–100.0)
Platelets: 184 10*3/uL (ref 150.0–400.0)
RBC: 5.53 Mil/uL (ref 4.22–5.81)
RDW: 13 % (ref 11.5–15.5)
WBC: 5.4 10*3/uL (ref 4.0–10.5)

## 2017-01-30 LAB — POCT GLYCOSYLATED HEMOGLOBIN (HGB A1C): Hemoglobin A1C: 6.6

## 2017-01-30 LAB — LDL CHOLESTEROL, DIRECT: Direct LDL: 114 mg/dL

## 2017-01-30 NOTE — Progress Notes (Signed)
Patient ID: Peter Keller, male   DOB: 01-24-1976, 41 y.o.   MRN: [id number]  HPI: Peter Keller is a 41 y.o.-year-old male, returning for follow-up for DM2, dx in 04/2013 (A1c was 11%), non-insulin-dependent, uncontrolled, without long term complications. Last visit 1 year and 3 mo ago!  DM2: Last hemoglobin A1c was: Lab Results  Component Value Date   HGBA1C 6.5 04/07/2015   HGBA1C 6.3 08/29/2014   HGBA1C 6.1 03/20/2014   He is on: - Metformin 1000 mg 2x a day - Trulicity 1.5 mg weekly. - Invokana 100 >> 300 mg in am (150 mg for a period of time when his weight was lower) - Glipizide 5 mg before a meal (rarely) Patient was initially on Janumet extended-release, which was subsequently stopped.  Amaryl was added 07/2013, but then switched to Invokana 100 mg daily in 09/2013 because of weight gain. Trulicity 4.13 mg weekly was added in 11/2013 for help with weight loss >> increased to 1.5 mg weekly in 01/2014. Metformin extended-release was started back in 03/2014 and increased to 1500 mg daily.  Pt checks his sugars 1-3x a day: - am: 97, 116-151, 181, 217 >> 119, 143-167, 181 >> 118, 132, 176 - 2h after b'fast: 149 - before lunch: n/c >> 134, 154 - 2h after lunch: n/c - before dinner: n/c - 2h after dinner: 120-160 >> 121 on diet >> n/c - bedtime: n/c >> 209 while in vacation >> n/c - nighttime: n/c Lowest sugar was 97 >> 119 >> 118; + hypoglycemia awareness  - ? Level. Highest sugar was 181  Glucometer: One Probation officer IQ. Also has a Freestyle Libre CGM.  + regular exercise >> gym - weights + intense cardio - not in last 3 mo.  - No CKD, last BUN/creatinine:  Lab Results  Component Value Date   BUN 14 10/30/2015   CREATININE 0.78 10/30/2015   - + HL;  last set of lipids: Lab Results  Component Value Date   CHOL 148 10/20/2015   HDL 41.00 10/20/2015   LDLCALC 79 10/20/2015   LDLDIRECT 122.9 12/06/2013   TRIG 139.0 10/20/2015   CHOLHDL 4 10/20/2015  On Lipitor 40. -  last eye exam was in 2014 >> No DR. - no numbness and tingling in his feet.  HTN: He went skiing 2 weeks ago and forgot BP meds: drank 2 cups of coffee >> HR 125-145 and BP when he returned 213/105. He is on Hyzaar. He also took Atenolol since he returned. He saw Dr Lovena Le (cardiology). He will have a Holter monitor and a 2D ECHO.  ROS: Constitutional: + weight loss, no fatigue, no subjective hyperthermia, no subjective hypothermia Eyes: no blurry vision, no xerophthalmia ENT: no sore throat, no nodules palpated in throat, no dysphagia, no odynophagia, no hoarseness Cardiovascular: no CP/no SOB/no palpitations/no leg swelling Respiratory: no cough/no SOB/no wheezing Gastrointestinal: no N/no V/no D/no C/no acid reflux Musculoskeletal: no muscle aches/no joint aches Skin: no rashes, no hair loss Neurological: no tremors/no numbness/no tingling/no dizziness  I reviewed pt's medications, allergies, PMH, social hx, family hx, and changes were documented in the history of present illness. Otherwise, unchanged from my initial visit note.  Past Medical History:  Diagnosis Date  . Alcohol dependence ([place])   . Diabetes ([place])   . GERD (gastroesophageal reflux disease)   . Hepatic steatosis   . Hyperlipidemia   . Hypertension   . Obesity   . Sinus tachycardia    Past Surgical History:  Procedure Laterality Date  .  none     History   Social History  . Marital Status: Married    Spouse Name: N/A  . Number of Children: 2   Occupational History  .  Advertising account executive    Social History Main Topics  . Smoking status: Never Smoker   . Smokeless tobacco: Never Used  . Alcohol Use: Yes     Comment: 3 alcoholic drinks daily  . Drug Use: No   Current Outpatient Medications on File Prior to Visit  Medication Sig Dispense Refill  . atorvastatin (LIPITOR) 20 MG tablet TAKE ONE TABLET BY MOUTH DAILY 90 tablet 1  . Blood Glucose Monitoring Suppl (ONETOUCH VERIO IQ SYSTEM) w/Device KIT Use  as directed 1 kit 0  . buPROPion (WELLBUTRIN XL) 150 MG 24 hr tablet TAKE 1 TABLET (150 MG TOTAL) BY MOUTH DAILY. 30 tablet 3  . Cholecalciferol (VITAMIN D) 400 UNITS capsule Take 500 Units by mouth daily.    . Continuous Blood Gluc Receiver (FREESTYLE LIBRE READER) DEVI 1 Device by Does not apply route daily. 1 Device 1  . Continuous Blood Gluc Sensor (FREESTYLE LIBRE SENSOR SYSTEM) MISC 1 Device by Does not apply route every 30 (thirty) days. 3 each 11  . esomeprazole (NEXIUM) 20 MG capsule Take 20 mg by mouth daily at 12 noon.    Marland Kitchen glipiZIDE (GLUCOTROL) 5 MG tablet TAKE 1 TABLET (5 MG TOTAL) BY MOUTH DAILY WITH SUPPER. **PT NEEDS FOLLOW UP APPT** 30 tablet 2  . INVOKANA 300 MG TABS tablet TAKE ONE TABLET BY MOUTH DAILY BEFORE BREAKFAST 30 tablet 0  . losartan-hydrochlorothiazide (HYZAAR) 100-25 MG tablet Take 1 tablet by mouth daily. 90 tablet 3  . metFORMIN (GLUCOPHAGE) 1000 MG tablet TAKE ONE TABLET BY MOUTH TWICE A DAY WITH A MEAL 180 tablet 3  . ONETOUCH DELICA LANCETS 01S MISC USE TO OBTAIN A BLOOD SPECIMEN ONCE PER DAY 100 each 0  . ONETOUCH VERIO test strip USE AS INSTRUCTED TO CHECK BLOOD SUGAR ONCE DAILY 50 each 2  . TRULICITY 1.5 WF/0.9NA SOPN INJECT 1.5 MG UNDER SKIN WEEKLY 2 pen 0   No current facility-administered medications on file prior to visit.    No Known Allergies Family History  Problem Relation Age of Onset  . Hypertension Mother   . Diabetes Mother   . Heart disease Paternal Uncle   . Heart attack Paternal Grandmother   . Colon cancer Neg Hx   . Esophageal cancer Neg Hx   . Stomach cancer Neg Hx    PE: BP (!) 150/108   Pulse 94   Ht 5' 8.5" (1.74 m)   Wt 219 lb 9.6 oz (99.6 kg)   SpO2 94%   BMI 32.90 kg/m  Wt Readings from Last 3 Encounters:  01/30/17 219 lb 9.6 oz (99.6 kg)  01/25/17 221 lb (100.2 kg)  04/07/16 224 lb 12 oz (101.9 kg)   Constitutional: overweight, in NAD Eyes: PERRLA, EOMI, no exophthalmos ENT: moist mucous membranes, no  thyromegaly, no cervical lymphadenopathy Cardiovascular: tachycardia, RR, No MRG Respiratory: CTA B Gastrointestinal: abdomen soft, NT, ND, BS+ Musculoskeletal: no deformities, strength intact in all 4 Skin: moist, warm, no rashes Neurological: no tremor with outstretched hands, DTR normal in all 4  ASSESSMENT: 1. DM2, non-insulin-dependent, controlled, without complications  2. Hypertriglyceridemia  3. Essential hypertension   4. Transaminitis - Right upper quadrant ultrasound from 08/07/2013 shows probable fatty infiltration, confirmed on the abdominal MRI 08/13/2013 - AST> ALT, consistent with excessive alcohol use  PLAN:  1. Patient with history of controlled diabetes, improved on GLP-1 receptor agonist and SGLT-2 inhibitor.  At last visit, we increased the dose of Invokana, and we also moved his glipizide from bedtime to dinnertime. However, he is not taking Glipizide, only rarely and not before dinner (for e.g. He took one today as his CBG was in the 170s this am - and did not eat!).  - We again discussed about taking the glipizide before a larger dinner (he mentions that dinner is his largest meal of the day and he does not usually go to bed without eating).  I also advised him not to take the glipizide if he is not going to eat and 30 minutes. - He does not have many sugar checks and his meter in the last 2 months.  He did not use his freestyle libre CGM frequently since he obtained it, he mentions that he only uses it when he does not have the means to check his sugars. - He feels that his sugars are a little higher in the last 3 months after he stopped exercising regularly.  He is planning to start using his treadmill to build up cardio.  I strongly recommended this. - No other changes in his regimen are necessary. - I suggested to:  Patient Instructions  Please continue: - Metformin 1000 mg 2x a day with meals - Trulicity 1.5 mg weekly - Invokana 300 mg before  breakfast  Start Glipizide 5 mg before a larger dinner.  Please stop at the lab.  Please return in 4 months with your sugar log.  - today, HbA1c is 6.6% (stable) - continue checking sugars at different times of the day - check 1-2x a day, rotating checks - advised for yearly eye exams >> he is not up-to-date - Return to clinic in 4 mo with sugar log   2. Hypertriglyceridemia - His triglycerides were in the 900s at the time of the diagnosis, now doing much better on statin, and off fenofibrate  - no side effects from the statin - He check need a new lipid panel  3. Essential hypertension - He continues on Hyzaar 100-25 mg daily and also on Invokana, which is a diuretic effect.  He forgot his medicines at home when he was skiing 2 weeks ago pulse and blood pressure increased worrisome levels.  Since then, he restarted his medication and also added b a beta-blocker that he had at home.  He saw cardiology and will have further heart tests.  He was also advised to start a cardiac exercise regimen. - He also has white coat hypertension.  At today's visit, blood pressure is above target, 150/108. - We will check a new CMP  4. Transaminitis - He had a period off alcohol, now still drinking wine but trying to cut down.  Again discussed about the absolute need to stop. - We will check a new AST/ALT level  Office Visit on 01/30/2017  Component Date Value Ref Range Status  . Microalb, Ur 01/30/2017 6.2* 0.0 - 1.9 mg/dL Final  . Creatinine,U 01/30/2017 85.6  mg/dL Final  . Microalb Creat Ratio 01/30/2017 7.2  0.0 - 30.0 mg/g Final  . Glucose, Bld 01/30/2017 108* 65 - 99 mg/dL Final   Comment: .            Fasting reference interval . For someone without known diabetes, a glucose value between 100 and 125 mg/dL is consistent with prediabetes and should be confirmed with a follow-up test. .   Marland Kitchen  BUN 01/30/2017 11  7 - 25 mg/dL Final  . Creat 01/30/2017 0.65  0.60 - 1.35 mg/dL Final  .  GFR, Est Non African American 01/30/2017 121  > OR = 60 mL/min/1.48m Final  . GFR, Est African American 01/30/2017 140  > OR = 60 mL/min/1.763mFinal  . BUN/Creatinine Ratio 1297/67/3419OT APPLICABLE  6 - 22 (calc) Final  . Sodium 01/30/2017 142  135 - 146 mmol/L Final  . Potassium 01/30/2017 4.0  3.5 - 5.3 mmol/L Final  . Chloride 01/30/2017 104  98 - 110 mmol/L Final  . CO2 01/30/2017 26  20 - 32 mmol/L Final  . Calcium 01/30/2017 9.7  8.6 - 10.3 mg/dL Final  . Total Protein 01/30/2017 7.8  6.1 - 8.1 g/dL Final  . Albumin 01/30/2017 4.6  3.6 - 5.1 g/dL Final  . Globulin 01/30/2017 3.2  1.9 - 3.7 g/dL (calc) Final  . AG Ratio 01/30/2017 1.4  1.0 - 2.5 (calc) Final  . Total Bilirubin 01/30/2017 0.7  0.2 - 1.2 mg/dL Final  . Alkaline phosphatase (APISO) 01/30/2017 59  40 - 115 U/L Final  . AST 01/30/2017 113* 10 - 40 U/L Final  . ALT 01/30/2017 91* 9 - 46 U/L Final  . Cholesterol 01/30/2017 169  0 - 200 mg/dL Final   ATP III Classification       Desirable:  < 200 mg/dL               Borderline High:  200 - 239 mg/dL          High:  > = 240 mg/dL  . Triglycerides 01/30/2017 286.0* 0.0 - 149.0 mg/dL Final   Normal:  <150 mg/dLBorderline High:  150 - 199 mg/dL  . HDL 01/30/2017 46.20  >39.00 mg/dL Final  . VLDL 01/30/2017 57.2* 0.0 - 40.0 mg/dL Final  . Total CHOL/HDL Ratio 01/30/2017 4   Final                  Men          Women1/2 Average Risk     3.4          3.3Average Risk          5.0          4.42X Average Risk          9.6          7.13X Average Risk          15.0          11.0                      . NonHDL 01/30/2017 123.28   Final   NOTE:  Non-HDL goal should be 30 mg/dL higher than patient's LDL goal (i.e. LDL goal of < 70 mg/dL, would have non-HDL goal of < 100 mg/dL)  . WBC 01/30/2017 5.4  4.0 - 10.5 K/uL Final  . RBC 01/30/2017 5.53  4.22 - 5.81 Mil/uL Final  . Platelets 01/30/2017 184.0  150.0 - 400.0 K/uL Final  . Hemoglobin 01/30/2017 17.8* 13.0 - 17.0 g/dL Final  . HCT  01/30/2017 51.3  39.0 - 52.0 % Final  . MCV 01/30/2017 92.6  78.0 - 100.0 fl Final  . MCHC 01/30/2017 34.6  30.0 - 36.0 g/dL Final  . RDW 01/30/2017 13.0  11.5 - 15.5 % Final  . Hemoglobin A1C 01/30/2017 6.6   Final  . Direct LDL 01/30/2017 114.0  mg/dL Final  Optimal:  <100 mg/dLNear or Above Optimal:  100-129 mg/dLBorderline High:  130-159 mg/dLHigh:  160-189 mg/dLVery High:  >190 mg/dL   HbA1c, glucose -both slightly high. LFTs still high. Triglycerides have increased. His hemoglobin is still high.  Philemon Kingdom, MD PhD [facility] Endocrinology

## 2017-01-30 NOTE — Patient Instructions (Addendum)
Please continue: - Metformin 1000 mg 2x a day with meals - Trulicity 1.5 mg weekly - Invokana 300 mg before breakfast  Start Glipizide 5 mg before a larger dinner.  Please stop at the lab.  Please return in 4 months with your sugar log.

## 2017-01-31 MED ORDER — FENOFIBRATE 145 MG PO TABS: 145.0000 mg | ORAL_TABLET | Freq: Every day | ORAL | 3 refills | Status: DC

## 2017-01-31 NOTE — Addendum Note (Signed)
Addended by: Yolande JollyLAWSON, Candice Lunney on: 01/31/2017 04:49 PM   Modules accepted: Orders

## 2017-02-03 ENCOUNTER — Ambulatory Visit (INDEPENDENT_AMBULATORY_CARE_PROVIDER_SITE_OTHER): Payer: Managed Care, Other (non HMO)

## 2017-02-03 ENCOUNTER — Other Ambulatory Visit: Payer: Self-pay

## 2017-02-03 ENCOUNTER — Ambulatory Visit (HOSPITAL_COMMUNITY): Payer: Managed Care, Other (non HMO) | Attending: Internal Medicine

## 2017-02-03 DIAGNOSIS — E119 Type 2 diabetes mellitus without complications: Secondary | ICD-10-CM | POA: Diagnosis not present

## 2017-02-03 DIAGNOSIS — I10 Essential (primary) hypertension: Secondary | ICD-10-CM

## 2017-02-03 DIAGNOSIS — R002 Palpitations: Secondary | ICD-10-CM | POA: Diagnosis not present

## 2017-02-03 DIAGNOSIS — I472 Ventricular tachycardia, unspecified: Secondary | ICD-10-CM

## 2017-02-03 DIAGNOSIS — I119 Hypertensive heart disease without heart failure: Secondary | ICD-10-CM | POA: Diagnosis not present

## 2017-02-03 DIAGNOSIS — R0600 Dyspnea, unspecified: Secondary | ICD-10-CM | POA: Insufficient documentation

## 2017-02-09 ENCOUNTER — Other Ambulatory Visit: Payer: Self-pay | Admitting: Internal Medicine

## 2017-02-11 ENCOUNTER — Other Ambulatory Visit: Payer: Self-pay | Admitting: Internal Medicine

## 2017-02-11 ENCOUNTER — Encounter: Payer: Self-pay | Admitting: Internal Medicine

## 2017-02-21 MED ORDER — NEBIVOLOL HCL 5 MG PO TABS: 5.0000 mg | ORAL_TABLET | Freq: Every day | ORAL | 0 refills | Status: DC

## 2017-02-22 ENCOUNTER — Telehealth: Payer: Self-pay

## 2017-02-22 NOTE — Telephone Encounter (Signed)
**Note De-Identified Peter Keller Obfuscation** Approval on Bystolic PA received [name] fax from [employer]. Approval good from 02/22/2017 until 02/22/2018. I have notified the pts pharmacy.

## 2017-02-22 NOTE — Telephone Encounter (Signed)
**Note De-identified Darnesha Diloreto Obfuscation** I have done a Bystolic PA through covermymeds. 

## 2017-03-15 ENCOUNTER — Other Ambulatory Visit: Payer: Self-pay | Admitting: Internal Medicine

## 2017-03-16 ENCOUNTER — Other Ambulatory Visit: Payer: Self-pay | Admitting: *Deleted

## 2017-03-16 ENCOUNTER — Telehealth: Payer: Self-pay | Admitting: Internal Medicine

## 2017-03-16 ENCOUNTER — Other Ambulatory Visit: Payer: Self-pay | Admitting: Internal Medicine

## 2017-03-16 MED ORDER — NEBIVOLOL HCL 5 MG PO TABS: 5.0000 mg | ORAL_TABLET | Freq: Every day | ORAL | 3 refills | Status: DC

## 2017-03-16 NOTE — Telephone Encounter (Signed)
Rx has been sent in as requested.  

## 2017-03-16 NOTE — Telephone Encounter (Signed)
Is the dose on the patient medication list the correct dose and directions before I send over a script?

## 2017-03-16 NOTE — Telephone Encounter (Signed)
Pt was given 1 month supply of Bystolic, if doing well he was to be given a longer RX. He would like to get a 90 day RX with refills for 1 year, his insurance has already approved a 1 year supply. He uses [facility] [place].

## 2017-04-08 ENCOUNTER — Other Ambulatory Visit: Payer: Self-pay | Admitting: Internal Medicine

## 2017-04-10 ENCOUNTER — Encounter: Payer: Managed Care, Other (non HMO) | Admitting: Internal Medicine

## 2017-04-16 ENCOUNTER — Other Ambulatory Visit: Payer: Self-pay | Admitting: Internal Medicine

## 2017-04-18 ENCOUNTER — Encounter: Payer: Self-pay | Admitting: Internal Medicine

## 2017-04-18 ENCOUNTER — Ambulatory Visit: Payer: 59 | Admitting: Internal Medicine

## 2017-04-18 VITALS — BP 126/76 | HR 91 | Ht 69.0 in | Wt 224.0 lb

## 2017-04-18 DIAGNOSIS — I10 Essential (primary) hypertension: Secondary | ICD-10-CM | POA: Diagnosis not present

## 2017-04-18 DIAGNOSIS — R002 Palpitations: Secondary | ICD-10-CM | POA: Diagnosis not present

## 2017-04-18 NOTE — Progress Notes (Signed)
HPI Peter Keller returns today for ongoing evaluation and management of palpitations. He is a pleasant 42 yo man with palpitations, DM, HTN, and obesity. The patient has starting exercising and feels well. No syncope. He denies anginal symptoms. He admits to drinking too much ETOH. He has had a second or two of fleeting chest pain. He wore a 48 hour holter which showed no sustained arrhythmias. Only PAC's and PVC's. He had a 2D echo which was normal. His mother just had an MI. No Known Allergies   Current Outpatient Medications  Medication Sig Dispense Refill  . atorvastatin (LIPITOR) 20 MG tablet Take 1 tablet (20 mg total) by mouth daily. Keep June appointment for further refills 90 tablet 0  . Blood Glucose Monitoring Suppl (ONETOUCH VERIO IQ SYSTEM) w/Device KIT Use as directed 1 kit 0  . buPROPion (WELLBUTRIN XL) 150 MG 24 hr tablet TAKE 1 TABLET (150 MG TOTAL) BY MOUTH DAILY. 30 tablet 3  . Cholecalciferol (VITAMIN D) 400 UNITS capsule Take 500 Units by mouth daily.    . Continuous Blood Gluc Receiver (FREESTYLE LIBRE READER) DEVI 1 Device by Does not apply route daily. 1 Device 1  . Continuous Blood Gluc Sensor (FREESTYLE LIBRE SENSOR SYSTEM) MISC 1 Device by Does not apply route every 30 (thirty) days. 3 each 11  . esomeprazole (NEXIUM) 20 MG capsule Take 20 mg by mouth daily at 12 noon.    . fenofibrate (TRICOR) 145 MG tablet Take 1 tablet (145 mg total) by mouth daily. 90 tablet 3  . glipiZIDE (GLUCOTROL) 5 MG tablet TAKE 1 TABLET (5 MG TOTAL) BY MOUTH DAILY WITH SUPPER. **PT NEEDS FOLLOW UP APPT** 30 tablet 2  . INVOKANA 300 MG TABS tablet TAKE ONE TABLET BY MOUTH DAILY BEFORE BREAKFAST 24 tablet 0  . losartan-hydrochlorothiazide (HYZAAR) 100-25 MG tablet Take 1 tablet by mouth daily. 90 tablet 3  . metFORMIN (GLUCOPHAGE) 1000 MG tablet TAKE ONE TABLET BY MOUTH TWICE A DAY WITH A MEAL 180 tablet 3  . nebivolol (BYSTOLIC) 5 MG tablet Take 1 tablet (5 mg total) by mouth daily. 90  tablet 3  . ONETOUCH DELICA LANCETS 14G MISC USE TO OBTAIN A BLOOD SPECIMEN ONCE PER DAY 100 each 0  . ONETOUCH VERIO test strip USE AS INSTRUCTED TO CHECK BLOOD SUGAR ONCE DAILY 50 each 2  . TRULICITY 1.5 YJ/8.5UD SOPN INJECT 1.5 MG UNDER THE SKIN ONCE WEEKLY 2 pen 0   No current facility-administered medications for this visit.      Past Medical History:  Diagnosis Date  . Alcohol dependence ([place])   . Diabetes ([place])   . GERD (gastroesophageal reflux disease)   . Hepatic steatosis   . Hyperlipidemia   . Hypertension   . Obesity   . Sinus tachycardia     ROS:   All systems reviewed and negative except as noted in the HPI.   Past Surgical History:  Procedure Laterality Date  . none       Family History  Problem Relation Age of Onset  . Hypertension Mother   . Diabetes Mother   . Heart disease Paternal Uncle   . Heart attack Paternal Grandmother   . Colon cancer Neg Hx   . Esophageal cancer Neg Hx   . Stomach cancer Neg Hx      Social History   Socioeconomic History  . Marital status: Married    Spouse name: Not on file  . Number of children: Not on  file  . Years of education: Not on file  . Highest education level: Not on file  Social Needs  . Financial resource strain: Not on file  . Food insecurity - worry: Not on file  . Food insecurity - inability: Not on file  . Transportation needs - medical: Not on file  . Transportation needs - non-medical: Not on file  Occupational History  . Not on file  Tobacco Use  . Smoking status: Never Smoker  . Smokeless tobacco: Never Used  Substance and Sexual Activity  . Alcohol use: Yes    Comment: 3 alcoholic drinks daily  . Drug use: No  . Sexual activity: Not on file  Other Topics Concern  . Not on file  Social History Narrative  . Not on file     BP 126/76   Pulse 91   Ht _0  (1.753 m)   Wt 224 lb (101.6 kg)   SpO2 95%   BMI 33.08 kg/m   Physical Exam:  Well appearing 42 yo man, NAD HEENT:  Unremarkable Neck:  6 cm JVD, no thyromegally Lymphatics:  No adenopathy Back:  No CVA tenderness Lungs:  Clear with no wheezes HEART:  Regular rate rhythm, no murmurs, no rubs, no clicks Abd:  soft, positive bowel sounds, no organomegally, no rebound, no guarding Ext:  2 plus pulses, no edema, no cyanosis, no clubbing Skin:  No rashes no nodules Neuro:  CN II through XII intact, motor grossly intact  EKG - none  Assess/Plan: 1. Chest pain - his chest pain is lasting seconds and is non-cardiac. Watchful waiting. 2. HTN - his blood pressure is reasonably well controlled.  3. Overweight - I have encouraged him to work on losing weight.  4. Palpitations - his symptoms are much improved. No sustained arrhythmias.   Peter Keller.D.

## 2017-04-18 NOTE — Patient Instructions (Addendum)

## 2017-04-20 ENCOUNTER — Other Ambulatory Visit: Payer: Self-pay | Admitting: Internal Medicine

## 2017-05-11 ENCOUNTER — Other Ambulatory Visit: Payer: Self-pay | Admitting: Internal Medicine

## 2017-05-20 ENCOUNTER — Other Ambulatory Visit: Payer: Self-pay | Admitting: Internal Medicine

## 2017-05-24 ENCOUNTER — Other Ambulatory Visit: Payer: Self-pay | Admitting: Internal Medicine

## 2017-05-24 NOTE — Telephone Encounter (Signed)
Patient has a CPE for 07/26/17 can Rx be refilled till that appointment

## 2017-06-12 ENCOUNTER — Other Ambulatory Visit: Payer: Self-pay | Admitting: Internal Medicine

## 2017-07-07 ENCOUNTER — Other Ambulatory Visit: Payer: Self-pay | Admitting: Internal Medicine

## 2017-07-12 ENCOUNTER — Other Ambulatory Visit: Payer: Self-pay | Admitting: Internal Medicine

## 2017-07-20 ENCOUNTER — Other Ambulatory Visit: Payer: Self-pay | Admitting: Internal Medicine

## 2017-07-24 ENCOUNTER — Encounter: Payer: Managed Care, Other (non HMO) | Admitting: Internal Medicine

## 2017-07-26 ENCOUNTER — Ambulatory Visit (INDEPENDENT_AMBULATORY_CARE_PROVIDER_SITE_OTHER): Payer: 59 | Admitting: Internal Medicine

## 2017-07-26 ENCOUNTER — Encounter: Payer: Self-pay | Admitting: Internal Medicine

## 2017-07-26 ENCOUNTER — Other Ambulatory Visit (INDEPENDENT_AMBULATORY_CARE_PROVIDER_SITE_OTHER): Payer: 59

## 2017-07-26 VITALS — BP 118/82 | HR 89 | Temp 98.4°F | Ht 69.0 in | Wt 219.0 lb

## 2017-07-26 DIAGNOSIS — E785 Hyperlipidemia, unspecified: Secondary | ICD-10-CM

## 2017-07-26 DIAGNOSIS — K760 Fatty (change of) liver, not elsewhere classified: Secondary | ICD-10-CM

## 2017-07-26 DIAGNOSIS — Z0000 Encounter for general adult medical examination without abnormal findings: Secondary | ICD-10-CM | POA: Diagnosis not present

## 2017-07-26 DIAGNOSIS — I10 Essential (primary) hypertension: Secondary | ICD-10-CM | POA: Diagnosis not present

## 2017-07-26 DIAGNOSIS — E6609 Other obesity due to excess calories: Secondary | ICD-10-CM | POA: Diagnosis not present

## 2017-07-26 DIAGNOSIS — E1169 Type 2 diabetes mellitus with other specified complication: Secondary | ICD-10-CM | POA: Diagnosis not present

## 2017-07-26 DIAGNOSIS — Z6832 Body mass index (BMI) 32.0-32.9, adult: Secondary | ICD-10-CM | POA: Diagnosis not present

## 2017-07-26 DIAGNOSIS — F1020 Alcohol dependence, uncomplicated: Secondary | ICD-10-CM | POA: Diagnosis not present

## 2017-07-26 LAB — COMPREHENSIVE METABOLIC PANEL
ALT: 100 U/L — ABNORMAL HIGH (ref 0–53)
AST: 148 U/L — AB (ref 0–37)
Albumin: 4.5 g/dL (ref 3.5–5.2)
Alkaline Phosphatase: 52 U/L (ref 39–117)
BUN: 21 mg/dL (ref 6–23)
CALCIUM: 9.9 mg/dL (ref 8.4–10.5)
CHLORIDE: 100 meq/L (ref 96–112)
CO2: 28 mEq/L (ref 19–32)
CREATININE: 0.84 mg/dL (ref 0.40–1.50)
GFR: 106.62 mL/min (ref >60.00)
Glucose, Bld: 203 mg/dL — ABNORMAL HIGH (ref 70–99)
POTASSIUM: 3.7 meq/L (ref 3.5–5.1)
Sodium: 137 mEq/L (ref 135–145)
Total Bilirubin: 1.1 mg/dL (ref 0.2–1.2)
Total Protein: 7.9 g/dL (ref 6.0–8.3)

## 2017-07-26 LAB — LIPID PANEL
CHOL/HDL RATIO: 6
Cholesterol: 162 mg/dL (ref 0–200)
HDL: 29.2 mg/dL — AB (ref >39.00)
LDL CALC: 95 mg/dL (ref 0–99)
NonHDL: 132.62
TRIGLYCERIDES: 190 mg/dL — AB (ref 0.0–149.0)
VLDL: 38 mg/dL (ref 0.0–40.0)

## 2017-07-26 LAB — CBC
HCT: 50.9 % (ref 39.0–52.0)
Hemoglobin: 17.9 g/dL — ABNORMAL HIGH (ref 13.0–17.0)
MCHC: 35.2 g/dL (ref 30.0–36.0)
MCV: 90.1 fl (ref 78.0–100.0)
PLATELETS: 188 10*3/uL (ref 150.0–400.0)
RBC: 5.65 Mil/uL (ref 4.22–5.81)
RDW: 12.6 % (ref 11.5–15.5)
WBC: 6.1 10*3/uL (ref 4.0–10.5)

## 2017-07-26 LAB — HEMOGLOBIN A1C: Hgb A1c MFr Bld: 7 % — ABNORMAL HIGH (ref 4.6–6.5)

## 2017-07-26 MED ORDER — FENOFIBRATE 145 MG PO TABS: 145.0000 mg | ORAL_TABLET | Freq: Every day | ORAL | 3 refills | Status: DC

## 2017-07-26 MED ORDER — LOSARTAN POTASSIUM-HCTZ 100-25 MG PO TABS: 1.0000 | ORAL_TABLET | Freq: Every day | ORAL | 3 refills | Status: DC

## 2017-07-26 MED ORDER — ATORVASTATIN CALCIUM 20 MG PO TABS: 20.0000 mg | ORAL_TABLET | Freq: Every day | ORAL | 3 refills | Status: DC

## 2017-07-26 MED ORDER — CANAGLIFLOZIN 300 MG PO TABS: ORAL_TABLET | ORAL | 3 refills | Status: DC

## 2017-07-26 MED ORDER — BUPROPION HCL ER (XL) 150 MG PO TB24: 150.0000 mg | ORAL_TABLET | Freq: Every day | ORAL | 3 refills | Status: DC

## 2017-07-26 MED ORDER — GLIPIZIDE 5 MG PO TABS: 5.0000 mg | ORAL_TABLET | Freq: Every day | ORAL | 3 refills | Status: DC

## 2017-07-26 MED ORDER — METFORMIN HCL 1000 MG PO TABS: ORAL_TABLET | ORAL | 3 refills | Status: DC

## 2017-07-26 NOTE — Patient Instructions (Signed)
We have sent in the refills and will check the labs.   To do the best job protecting your liver from damage we would recommend no alcohol. However the safe limit for men without fatty liver is 1-2 drinks per day. All alcohol does the same damage to your body regardless of the form.    Health Maintenance, Male A healthy lifestyle and preventive care is important for your health and wellness. Ask your health care provider about what schedule of regular examinations is right for you. What should I know about weight and diet? Eat a Healthy Diet  Eat plenty of vegetables, fruits, whole grains, low-fat dairy products, and lean protein.  Do not eat a lot of foods high in solid fats, added sugars, or salt.  Maintain a Healthy Weight Regular exercise can help you achieve or maintain a healthy weight. You should:  Do at least 150 minutes of exercise each week. The exercise should increase your heart rate and make you sweat (moderate-intensity exercise).  Do strength-training exercises at least twice a week.  Watch Your Levels of Cholesterol and Blood Lipids  Have your blood tested for lipids and cholesterol every 5 years starting at 42 years of age. If you are at high risk for heart disease, you should start having your blood tested when you are 42 years old. You may need to have your cholesterol levels checked more often if: ? Your lipid or cholesterol levels are high. ? You are older than 42 years of age. ? You are at high risk for heart disease.  What should I know about cancer screening? Many types of cancers can be detected early and may often be prevented. Lung Cancer  You should be screened every year for lung cancer if: ? You are a current smoker who has smoked for at least 30 years. ? You are a former smoker who has quit within the past 15 years.  Talk to your health care provider about your screening options, when you should start screening, and how often you should be  screened.  Colorectal Cancer  Routine colorectal cancer screening usually begins at 42 years of age and should be repeated every 5-10 years until you are 42 years old. You may need to be screened more often if early forms of precancerous polyps or small growths are found. Your health care provider may recommend screening at an earlier age if you have risk factors for colon cancer.  Your health care provider may recommend using home test kits to check for hidden blood in the stool.  A small camera at the end of a tube can be used to examine your colon (sigmoidoscopy or colonoscopy). This checks for the earliest forms of colorectal cancer.  Prostate and Testicular Cancer  Depending on your age and overall health, your health care provider may do certain tests to screen for prostate and testicular cancer.  Talk to your health care provider about any symptoms or concerns you have about testicular or prostate cancer.  Skin Cancer  Check your skin from head to toe regularly.  Tell your health care provider about any new moles or changes in moles, especially if: ? There is a change in a mole's size, shape, or color. ? You have a mole that is larger than a pencil eraser.  Always use sunscreen. Apply sunscreen liberally and repeat throughout the day.  Protect yourself by wearing long sleeves, pants, a wide-brimmed hat, and sunglasses when outside.  What should I know about  heart disease, diabetes, and high blood pressure?  If you are 8018-42 years of age, have your blood pressure checked every 3-5 years. If you are 42 years of age or older, have your blood pressure checked every year. You should have your blood pressure measured twice-once when you are at a hospital or clinic, and once when you are not at a hospital or clinic. Record the average of the two measurements. To check your blood pressure when you are not at a hospital or clinic, you can use: ? An automated blood pressure machine at a  pharmacy. ? A home blood pressure monitor.  Talk to your health care provider about your target blood pressure.  If you are between 4145-42 years old, ask your health care provider if you should take aspirin to prevent heart disease.  Have regular diabetes screenings by checking your fasting blood sugar level. ? If you are at a normal weight and have a low risk for diabetes, have this test once every three years after the age of 42. ? If you are overweight and have a high risk for diabetes, consider being tested at a younger age or more often.  A one-time screening for abdominal aortic aneurysm (AAA) by ultrasound is recommended for men aged 65-75 years who are current or former smokers. What should I know about preventing infection? Hepatitis B If you have a higher risk for hepatitis B, you should be screened for this virus. Talk with your health care provider to find out if you are at risk for hepatitis B infection. Hepatitis C Blood testing is recommended for:  Everyone born from [birth year] through 1965.  Anyone with known risk factors for hepatitis C.  Sexually Transmitted Diseases (STDs)  You should be screened each year for STDs including gonorrhea and chlamydia if: ? You are sexually active and are younger than 42 years of age. ? You are older than 42 years of age and your health care provider tells you that you are at risk for this type of infection. ? Your sexual activity has changed since you were last screened and you are at an increased risk for chlamydia or gonorrhea. Ask your health care provider if you are at risk.  Talk with your health care provider about whether you are at high risk of being infected with HIV. Your health care provider may recommend a prescription medicine to help prevent HIV infection.  What else can I do?  Schedule regular health, dental, and eye exams.  Stay current with your vaccines (immunizations).  Do not use any tobacco products, such as  cigarettes, chewing tobacco, and e-cigarettes. If you need help quitting, ask your health care provider.  Limit alcohol intake to no more than 2 drinks per day. One drink equals 12 ounces of beer, 5 ounces of wine, or 1 ounces of hard liquor.  Do not use street drugs.  Do not share needles.  Ask your health care provider for help if you need support or information about quitting drugs.  Tell your health care provider if you often feel depressed.  Tell your health care provider if you have ever been abused or do not feel safe at home. This information is not intended to replace advice given to you by your health care provider. Make sure you discuss any questions you have with your health care provider. Document Released: 07/30/2007 Document Revised: 09/30/2015 Document Reviewed: 11/04/2014 Elsevier Interactive Patient Education  [employer]2018 [employer]vier Inc.

## 2017-07-26 NOTE — Progress Notes (Signed)
   Subjective:    Patient ID: Peter Keller, male    DOB: 07/28/1975, 42 y.o.   MRN: [id number]  HPI The patient is a 42 YO man coming in for physical. Asking about alcohol and his health and is still drinking about 4 drinks per day at this time.   [facility], social history reviewed and updated.   Review of Systems  Constitutional: Negative.   HENT: Negative.   Eyes: Negative.   Respiratory: Negative for cough, chest tightness and shortness of breath.   Cardiovascular: Negative for chest pain, palpitations and leg swelling.  Gastrointestinal: Negative for abdominal distention, abdominal pain, constipation, diarrhea, nausea and vomiting.  Musculoskeletal: Negative.   Skin: Negative.   Neurological: Negative.   Psychiatric/Behavioral: Negative.       Objective:   Physical Exam  Constitutional: He is oriented to person, place, and time. He appears well-developed and well-nourished.  HENT:  Head: Normocephalic and atraumatic.  Eyes: EOM are normal.  Neck: Normal range of motion.  Cardiovascular: Normal rate and regular rhythm.  Pulmonary/Chest: Effort normal and breath sounds normal. No respiratory distress. He has no wheezes. He has no rales.  Abdominal: Soft. Bowel sounds are normal. He exhibits no distension. There is no tenderness. There is no rebound.  Musculoskeletal: He exhibits no edema.  Neurological: He is alert and oriented to person, place, and time. Coordination normal.  Skin: Skin is warm and dry.  Psychiatric: He has a normal mood and affect.   Vitals:   07/26/17 0853  BP: 118/82  Pulse: 89  Temp: 98.4 F (36.9 C)  TempSrc: Oral  SpO2: 96%  Weight: 219 lb (99.3 kg)  Height: 5\' 9"  (1.753 m)      Assessment & Plan:

## 2017-07-28 NOTE — Assessment & Plan Note (Signed)
BP at goal on his losartan/hctz and bystolic. Checking CMP and adjust as needed.

## 2017-07-28 NOTE — Assessment & Plan Note (Signed)
BMI 32 and complicated by diabetes, hyperlipidemia, hypertension.

## 2017-07-28 NOTE — Assessment & Plan Note (Addendum)
Tdap up to date, counseled about hiv screening. Flu shot reminded. Pneumonia done until 65. Counseled about alcohol intake and sun safety as well as dangers of distracted driving. Given screening recommendations.

## 2017-07-28 NOTE — Assessment & Plan Note (Signed)
Checking HgA1c, taking glipizide only if needed for high sugars. Taking invokana, metformin, trulicity regularly. Adjust as needed. Foot exam done. Eye exam upcoming.

## 2017-07-28 NOTE — Assessment & Plan Note (Signed)
Taking fenofibrate again for high triglycerides as well as lipitor. Goal LDL <100. Associated with the diabetes.

## 2017-07-28 NOTE — Assessment & Plan Note (Signed)
Complicated by increased LFTs. We have again recommended to stop alcohol altogether or at least cut back from current 4 drinks daily. He is not sure if he can do this.

## 2017-07-28 NOTE — Assessment & Plan Note (Signed)
Likely partially caused by alcohol intake, as well as obesity. We talked about the need for no alcohol and dietary changes and increasing exercise.

## 2017-07-30 ENCOUNTER — Other Ambulatory Visit: Payer: Self-pay | Admitting: Internal Medicine

## 2017-11-19 ENCOUNTER — Other Ambulatory Visit: Payer: Self-pay | Admitting: Internal Medicine

## 2017-11-29 ENCOUNTER — Ambulatory Visit (INDEPENDENT_AMBULATORY_CARE_PROVIDER_SITE_OTHER): Payer: 59 | Admitting: Orthopaedic Surgery

## 2017-11-29 ENCOUNTER — Encounter (INDEPENDENT_AMBULATORY_CARE_PROVIDER_SITE_OTHER): Payer: Self-pay | Admitting: Orthopaedic Surgery

## 2017-11-29 ENCOUNTER — Ambulatory Visit (INDEPENDENT_AMBULATORY_CARE_PROVIDER_SITE_OTHER): Payer: 59

## 2017-11-29 DIAGNOSIS — M545 Low back pain, unspecified: Secondary | ICD-10-CM

## 2017-11-29 MED ORDER — METHOCARBAMOL 500 MG PO TABS: 500.0000 mg | ORAL_TABLET | Freq: Four times a day (QID) | ORAL | 0 refills | Status: DC | PRN

## 2017-11-29 MED ORDER — TRAMADOL HCL 50 MG PO TABS
50.0000 mg | ORAL_TABLET | Freq: Four times a day (QID) | ORAL | 0 refills | Status: DC | PRN
Start: 2017-11-29 — End: 2018-04-23

## 2017-11-29 NOTE — Progress Notes (Signed)
Office Visit Note   Patient: Peter Keller           Date of Birth: Dec 24, 1975           MRN: [id number] Visit Date: 11/29/2017              Requested by: Myrlene Broker, MD [address] [place], [place] [address] PCP: Myrlene Broker, MD   Assessment & Plan: Visit Diagnoses:  1. Acute right-sided low back pain without sciatica     Plan: I will send in some Robaxin and tramadol for him and I did show him some back extension exercises to try.  Given the fact that this is an acute flareup recently have been going on for some period time and given the fact that now anti-inflammatories and steroids have not helped, an MRI is warranted to rule out herniated disc and help guide what further intervention may be helpful for the situation.  All question concerns were answered and addressed.  We will see him back once we get the MRI of his lumbar spine.  Follow-Up Instructions: Return in about 2 weeks (around 12/13/2017).   Orders:  Orders Placed This Encounter  Procedures  . XR Lumbar Spine 2-3 Views   Meds ordered this encounter  Medications  . methocarbamol (ROBAXIN) 500 MG tablet    Sig: Take 1 tablet (500 mg total) by mouth every 6 (six) hours as needed for muscle spasms.    Dispense:  40 tablet    Refill:  0  . traMADol (ULTRAM) 50 MG tablet    Sig: Take 1-2 tablets (50-100 mg total) by mouth every 6 (six) hours as needed.    Dispense:  40 tablet    Refill:  0      Procedures: No procedures performed   Clinical Data: No additional findings.   Subjective: Chief Complaint  Patient presents with  . Lower Back - Pain  The patient is a  pleasant 42 year old gentleman whose had an acute flareup of back pain with some radicular components into his backside bilaterally.  He said this happened before and he has taken a steroid and anti-inflammatories and things have calmed down.  He is a diabetic but reports good blood glucose control.  He said no change in bowel  bladder function but the pain is flared up again and is having difficulty getting a comfortable position to sleep and and having a lot of discomfort in his back when he first gets up in the morning.  In spite of conservative treatment at this point his pain is not getting any better and it is slightly getting worse.  HPI  Review of Systems He currently denies any headache, chest pain, shortness of breath, fever, chills, nausea, vomiting.  Objective: Vital Signs: There were no vitals taken for this visit.  Physical Exam On examination of his lumbar spine he has significant midline tenderness at the lowest aspect lumbar spine and this does radiate in the paraspinal muscles bilaterally.  He has a positive straight leg raise bilaterally as well.  He has excellent strength in his bilateral lower extremities and normal reflexes.  He has slight decreased sensation but he is a long-term diabetic. Ortho Exam  Specialty Comments:  No specialty comments available.  Imaging: Xr Lumbar Spine 2-3 Views  Result Date: 11/29/2017 2 views of the lumbar spine show no acute findings and no significant changes in alignment.    PMFS History: Patient Active Problem List   Diagnosis Date  Noted  . Transaminitis 01/30/2017  . Routine general medical examination at a health care facility 04/07/2015  . Essential hypertension, benign 08/29/2014  . Type 2 diabetes mellitus with hyperlipidemia (HCC) 08/29/2014  . Fatty liver 08/29/2014  . Snoring 12/27/2010  . Alcohol dependence (HCC) 12/27/2010  . Hyperlipidemia 12/27/2010  . Obesity 07/17/2009  . GERD 07/17/2009   Past Medical History:  Diagnosis Date  . Alcohol dependence (HCC)   . Diabetes (HCC)   . GERD (gastroesophageal reflux disease)   . Hepatic steatosis   . Hyperlipidemia   . Hypertension   . Obesity   . Sinus tachycardia     Family History  Problem Relation Age of Onset  . Hypertension Mother   . Diabetes Mother   . Heart disease  Paternal Uncle   . Heart attack Paternal Grandmother   . Colon cancer Neg Hx   . Esophageal cancer Neg Hx   . Stomach cancer Neg Hx     Past Surgical History:  Procedure Laterality Date  . none     Social History   Occupational History  . Not on file  Tobacco Use  . Smoking status: Never Smoker  . Smokeless tobacco: Never Used  Substance and Sexual Activity  . Alcohol use: Yes    Comment: 3 alcoholic drinks daily  . Drug use: No  . Sexual activity: Not on file

## 2017-11-30 ENCOUNTER — Other Ambulatory Visit (INDEPENDENT_AMBULATORY_CARE_PROVIDER_SITE_OTHER): Payer: Self-pay

## 2017-11-30 DIAGNOSIS — M4807 Spinal stenosis, lumbosacral region: Secondary | ICD-10-CM

## 2017-12-08 ENCOUNTER — Ambulatory Visit
Admission: RE | Admit: 2017-12-08 | Discharge: 2017-12-08 | Disposition: A | Discharge status: Home / Self Care | Payer: 59 | Source: Ambulatory Visit | Attending: Orthopaedic Surgery | Admitting: Orthopaedic Surgery

## 2017-12-08 DIAGNOSIS — M4807 Spinal stenosis, lumbosacral region: Secondary | ICD-10-CM

## 2017-12-13 ENCOUNTER — Other Ambulatory Visit (INDEPENDENT_AMBULATORY_CARE_PROVIDER_SITE_OTHER): Payer: Self-pay

## 2017-12-13 ENCOUNTER — Ambulatory Visit (INDEPENDENT_AMBULATORY_CARE_PROVIDER_SITE_OTHER): Payer: 59 | Admitting: Orthopaedic Surgery

## 2017-12-13 ENCOUNTER — Encounter (INDEPENDENT_AMBULATORY_CARE_PROVIDER_SITE_OTHER): Payer: Self-pay | Admitting: Orthopaedic Surgery

## 2017-12-13 DIAGNOSIS — M545 Low back pain, unspecified: Secondary | ICD-10-CM

## 2017-12-13 DIAGNOSIS — G8929 Other chronic pain: Secondary | ICD-10-CM

## 2017-12-13 NOTE — Progress Notes (Signed)
The patient comes in today to go over an MRI of his lumbar spine.  He is a very pleasant 42 year old gentleman who is someone that I know through his wife.  His wife is one of the hospitalists over at [facility].  He saw me a few weeks ago due to worsening low back pain with some sciatic component.  Most pain is mainly in his low back and not going down either leg.  There is been no weakness.  He has a slight bit of truncal obesity.  Most of his pain is with flexion extension of the lumbar spine itself.  He still denies any change in bowel or bladder function and denies any weakness in his legs.  There is no numbness and tingling today in either feet.  On exam he appears comfortable.  He does have pain in the lower lumbar spine with flexion extension and palpation.  There are no radicular components today.  MRI is reviewed with him.  As is with his plain films does show some loss of his lumbar lordosis secondary likely to pain.  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  I would like to send him at this point to Dr. Alvester Morin to consider bilateral facet injections.  I will then see him back myself in 4 weeks to see if this is helped.  All question concerns were answered and addressed.  I did counsel him about weight loss and core strengthening exercises as well.

## 2017-12-28 ENCOUNTER — Ambulatory Visit (INDEPENDENT_AMBULATORY_CARE_PROVIDER_SITE_OTHER): Payer: Self-pay

## 2017-12-28 ENCOUNTER — Encounter (INDEPENDENT_AMBULATORY_CARE_PROVIDER_SITE_OTHER): Payer: Self-pay | Admitting: Physical Medicine and Rehabilitation

## 2017-12-28 ENCOUNTER — Ambulatory Visit (INDEPENDENT_AMBULATORY_CARE_PROVIDER_SITE_OTHER): Payer: 59 | Admitting: Physical Medicine and Rehabilitation

## 2017-12-28 VITALS — BP 146/99 | HR 85 | Temp 98.3°F

## 2017-12-28 DIAGNOSIS — M47816 Spondylosis without myelopathy or radiculopathy, lumbar region: Secondary | ICD-10-CM

## 2017-12-28 DIAGNOSIS — M545 Low back pain, unspecified: Secondary | ICD-10-CM

## 2017-12-28 DIAGNOSIS — Z0000 Encounter for general adult medical examination without abnormal findings: Secondary | ICD-10-CM

## 2017-12-28 DIAGNOSIS — M5136 Other intervertebral disc degeneration, lumbar region: Secondary | ICD-10-CM | POA: Diagnosis not present

## 2017-12-28 DIAGNOSIS — G8929 Other chronic pain: Secondary | ICD-10-CM

## 2017-12-28 MED ORDER — METHYLPREDNISOLONE ACETATE 80 MG/ML IJ SUSP: 80.0000 mg | Freq: Once | INTRAMUSCULAR | Status: DC

## 2017-12-28 NOTE — Progress Notes (Signed)
.  Numeric Pain Rating Scale and Functional Assessment Average Pain 6   In the last MONTH (on 0-10 scale) has pain interfered with the following?  1. General activity like being  able to carry out your everyday physical activities such as walking, climbing stairs, carrying groceries, or moving a chair?  Rating(5)   +Driver, -BT, -Dye Allergies.  

## 2017-12-28 NOTE — Patient Instructions (Signed)
Dr. Stuart McGill  "The Big Three" that will safely increase your endurance and protect your back: modified curl-up, side bridge, and bird dog.  1. Modified Curl-Up Lie your back with one knee bent and one knee straight, this puts your pelvis in a neutral position and the muscles of your core in an optimal alignment of pull to avoid strain on the low back. Place your hands under the arch of your low back and ensure that this arch is maintained throughout the curl-up. Start by bracing your abdomen; this is different from flexing your abs, bear down through your belly. Now make sure you can take a breath in and a breath out while maintaining this brace. If you cannot, stop there and practice doing just that until you've got it mastered! Now, pretend that your spine in your neck and your upper back are cemented together and do not move independently. Pick a spot on the ceiling and focus your gaze there, lift your shoulder blades about 30 off the floor and slowly return to the start position. Take note of your neck, and ensure that your chin isn't poking forward when you do a curl up. If you're struggling with that, focus on making a double chin. Perform 3 sets of 10-12.  2. Side Bridge Lie on your side and prop yourself up on your elbow. Ensure that your elbow is directly under your shoulder to avoid any unnecessary strain through your shoulder joint. With your legs straight, place your top foot on the ground in front of your bottom foot. Place your top hand on your bottom shoulder. While maintaining the natural curve of your spine, that is to say, be sure that your upper body isn't twisted or leaning forward, brace your abdomen, squeeze through your gluteals (clench your bum), and lift your hips up off the ground. Don't forget to breathe! Hold for 8-10 seconds, repeat 3 times. As the exercise becomes easier, increase the number of repetitions as opposed to the length of time. There are a number of ways to  modify this exercise in order to increase or decrease the difficulty such as the example below on the right. If it's not challenging enough, try putting that top hand on your top hip, or straight up in the air, but again, be sure your body stays straight!  3. Bird Dog Start on your hands and knees with your hands shoulder width apart directly under your shoulders, and knees hip width apart directly under your hips. Maintain a neutral spine. Brace through your abdomen and squeeze your gluteals. Ensure you can maintain this while you take a breath in and out. Lift your right arm in front until it's level with your shoulder, squeezing the muscles between your shoulder blades as you do so. At the same time, extend your left leg straight back until it is level with your hips, squeezing your gluteals, and keeping your hips square to the floor. Return to the starting position in a slow and controlled manner, and perform the same action with the left arm and right leg. That is one repetition. Perform 3 sets of 8-10 repetitions. As this exercise becomes easy, focus on co-contracting the muscles of your forearm and arms while you extend, the same goes for the muscles of your legs. For an additional challenge, instead of putting your hand and knee back down on the ground between reps, try just sweeping the floor and performing the next rep right away, or draw a square with your   arm and leg and then sweep the floor.  *Avoid exercises that forward flex the spine or extend the spine.  [facility] Discharge Instructions  *At any time if you have questions or concerns they can be answered by calling [phone]  All Patients: . You may experience an increase in your symptoms for the first 2 days (it can take 2 days to 2 weeks for the steroid/cortisone to have its maximal effect). . You may use ice to the site for the first 24 hours; 20 minutes on and 20 minutes off and may use heat after that  time. . You may resume and continue your current pain medications. If you need a refill please contact the prescribing physician. . You may resume your medications if any were stopped for the procedure. . You may shower but no swimming, tub bath or Jacuzzi for 24 hours. . Please remove bandage after 4 hours. . You may resume light activities as tolerated. . If you had Spine Injection, you should not drive for the next 3 hours due to anesthetics used in the procedure. Please have someone drive for you.  *If you have had sedation, Valium, Xanax, or lorazepam: Do not drive or use public transportation for 24 hours, do not operating hazardous machinery or make important personal/business decisions for 24 hours.  POSSIBLE STEROID SIDE EFFECTS: If experienced these should only last for a short period. Change in menstrual flow  Edema in (swelling)  Increased appetite Skin flushing (redness)  Skin rash/acne  Thrush (oral) Vaginitis    Increased sweating  Depression Increased blood glucose levels Cramping and leg/calf  Euphoria (feeling happy)  POSSIBLE PROCEDURE SIDE EFFECTS: Please call our office if concerned. Increased pain Increased numbness/tingling  Headache Nausea/vomiting Hematoma (bruising/bleeding) Edema (swelling at the site) Weakness  Infection (red/drainage at site) Fever greater than 100.63F  *In the event of a headache after epidural steroid injection: Drink plenty of fluids, especially water and try to lay flat when possible. If the headache does not get better after a few days or as always if concerned please call the office.

## 2018-01-10 ENCOUNTER — Ambulatory Visit (INDEPENDENT_AMBULATORY_CARE_PROVIDER_SITE_OTHER): Payer: 59 | Admitting: Orthopaedic Surgery

## 2018-01-23 NOTE — Procedures (Signed)
Lumbar Facet Joint Intra-Articular Injection(s) with Fluoroscopic Guidance  Patient: Peter Keller      Date of Birth: 12/15/1975 MRN: [id number] PCP: Myrlene Brokerrawford, Elizabeth A, MD      Visit Date: 12/28/2017   Universal Protocol:    Date/Time: 12/28/2017  Consent Given By: the patient  Position: PRONE   Additional Comments: Vital signs were monitored before and after the procedure. Patient was prepped and draped in the usual sterile fashion. The correct patient, procedure, and site was verified.   Injection Procedure Details:  Procedure Site One Meds Administered:  Meds ordered this encounter  Medications  . methylPREDNISolone acetate (DEPO-MEDROL) injection 80 mg     Laterality: Bilateral  Location/Site:  L5-S1  Needle size: 22 guage  Needle type: Spinal  Needle Placement: Articular  Findings:  -Comments: Excellent flow of contrast producing a partial arthrogram.  Procedure Details: The fluoroscope beam is vertically oriented in AP, and the inferior recess is visualized beneath the lower pole of the inferior apophyseal process, which represents the target point for needle insertion. When direct visualization is difficult the target point is located at the medial projection of the vertebral pedicle. The region overlying each aforementioned target is locally anesthetized with a 1 to 2 ml. volume of 1% Lidocaine without Epinephrine.   The spinal needle was inserted into each of the above mentioned facet joints using biplanar fluoroscopic guidance. A 0.25 to 0.5 ml. volume of Isovue-250 was injected and a partial facet joint arthrogram was obtained. A single spot film was obtained of the resulting arthrogram.    One to 1.25 ml of the steroid/anesthetic solution was then injected into each of the facet joints noted above.   Additional Comments:  The patient tolerated the procedure well Dressing: Band-Aid    Post-procedure details: Patient was observed during the  procedure. Post-procedure instructions were reviewed.  Patient left the clinic in stable condition.

## 2018-01-23 NOTE — Progress Notes (Signed)
Peter FileSukhjeet Demarest - 42 y.o. male MRN [id number]  Date of birth: 10/30/1975  Office Visit Note: Visit Date: 12/28/2017 PCP: Myrlene Brokerrawford, Elizabeth A, MD Referred by: Myrlene Brokerrawford, Elizabeth A, *  Subjective: Chief Complaint  Patient presents with  . Lower Back - Pain   HPI: Peter Keller is a 42 y.o. male who comes in today At the request of Dr. Doneen Poissonhristopher Blackman for diagnostic and hopefully therapeutic bilateral facet joint blocks.  Patient's been having really axial low back pain chronically and worsening for quite some time.  He really reports worsening over the last 5 weeks in particular.  No specific injury although some worsening with lifting.  He reports getting up from a seated position seems to make it worse.  He does use some heat for some pain relief.  His average pain is 6 out of 10.  There is no radicular pain.  MRI of the lumbar spine has been performed that shows pretty mild facet arthritis at L4-5 and L5-S1 with small facet joint effusions and we did review the images with the patient and his wife.  His wife is a hospitalist at [employer].  He does have small annular tear at L5-S1 as well.  We are going to complete diagnostic L5-S1 injections based on where he points in terms of his pain.  If he does not get relief would look at epidural injection at L5-S1 for this annular tear.  No real surgical concerns of his lumbar spine.  Patient does suffer from some anxiety which I think is manifesting an overlying myofascial pain syndrome as well.  Exam is really nonfocal he has tenderness with palpable taut bands in the lower spine and good distal strength.  ROS Otherwise per HPI.  Assessment & Plan: Visit Diagnoses:  1. Spondylosis without myelopathy or radiculopathy, lumbar region   2. Chronic bilateral low back pain without sciatica   3. Annular tear of lumbar disc     Plan: No additional findings.   Meds & Orders:  Meds ordered this encounter  Medications  . methylPREDNISolone acetate  (DEPO-MEDROL) injection 80 mg    Orders Placed This Encounter  Procedures  . Facet Injection  . XR C-ARM NO REPORT    Follow-up: Return if symptoms worsen or fail to improve.   Procedures: No procedures performed  Lumbar Facet Joint Intra-Articular Injection(s) with Fluoroscopic Guidance  Patient: Peter Keller      Date of Birth: 12/09/1975 MRN: [id number] PCP: Myrlene Brokerrawford, Elizabeth A, MD      Visit Date: 12/28/2017   Universal Protocol:    Date/Time: 12/28/2017  Consent Given By: the patient  Position: PRONE   Additional Comments: Vital signs were monitored before and after the procedure. Patient was prepped and draped in the usual sterile fashion. The correct patient, procedure, and site was verified.   Injection Procedure Details:  Procedure Site One Meds Administered:  Meds ordered this encounter  Medications  . methylPREDNISolone acetate (DEPO-MEDROL) injection 80 mg     Laterality: Bilateral  Location/Site:  L5-S1  Needle size: 22 guage  Needle type: Spinal  Needle Placement: Articular  Findings:  -Comments: Excellent flow of contrast producing a partial arthrogram.  Procedure Details: The fluoroscope beam is vertically oriented in AP, and the inferior recess is visualized beneath the lower pole of the inferior apophyseal process, which represents the target point for needle insertion. When direct visualization is difficult the target point is located at the medial projection of the vertebral pedicle. The region overlying each  aforementioned target is locally anesthetized with a 1 to 2 ml. volume of 1% Lidocaine without Epinephrine.   The spinal needle was inserted into each of the above mentioned facet joints using biplanar fluoroscopic guidance. A 0.25 to 0.5 ml. volume of Isovue-250 was injected and a partial facet joint arthrogram was obtained. A single spot film was obtained of the resulting arthrogram.    One to 1.25 ml of the steroid/anesthetic  solution was then injected into each of the facet joints noted above.   Additional Comments:  The patient tolerated the procedure well Dressing: Band-Aid    Post-procedure details: Patient was observed during the procedure. Post-procedure instructions were reviewed.  Patient left the clinic in stable condition.    Clinical History: MRI LUMBAR SPINE WITHOUT CONTRAST  TECHNIQUE: Multiplanar, multisequence MR imaging of the lumbar spine was performed. No intravenous contrast was administered.  COMPARISON:  11/29/2017 lumbar spine radiographs. 10/30/2015 CT abdomen and pelvis.  FINDINGS: Segmentation:  Standard.  Alignment:  Straightening of lumbar lordosis without listhesis.  Vertebrae:  No fracture, evidence of discitis, or bone lesion.  Conus medullaris and cauda equina: Conus extends to the L1 level. Conus and cauda equina appear normal.  Paraspinal and other soft tissues: Negative.  Disc levels:  L1-2: No significant disc displacement, foraminal stenosis, or canal stenosis.  L2-3: Mild disc desiccation and small disc bulge. No significant foraminal or canal stenosis.  L3-4: No significant disc displacement, foraminal stenosis, or canal stenosis.  L4-5: Mild disc bulge eccentric to the left. Small facet effusions. No significant foraminal or canal stenosis.  L5-S1: Mild disc bulge, central annular fissure, and small bilateral facet effusions. No significant foraminal or canal stenosis.  IMPRESSION: 1. No acute osseous abnormality. 2. L4-5 and L5-S1 facet effusions, likely degenerative. 3. Mild multilevel discogenic degenerative changes of the lumbar spine. No significant foraminal or canal stenosis.   Electronically Signed   By: Mitzi Hansen M.D.   On: 12/08/2017 20:55   He reports that he has never smoked. He has never used smokeless tobacco.  Recent Labs    01/30/17 1326 07/26/17 0931  HGBA1C 6.6 7.0*    Objective:   VS:  HT:    WT:   BMI:     BP:(!) 146/99  HR:85bpm  TEMP:98.3 F (36.8 C)(Oral)  RESP:  Physical Exam  Ortho Exam Imaging: No results found.  Past Medical/Family/Surgical/Social History: Medications & Allergies reviewed per EMR, new medications updated. Patient Active Problem List   Diagnosis Date Noted  . Transaminitis 01/30/2017  . Routine general medical examination at a health care facility 04/07/2015  . Essential hypertension, benign 08/29/2014  . Type 2 diabetes mellitus with hyperlipidemia (HCC) 08/29/2014  . Fatty liver 08/29/2014  . Snoring 12/27/2010  . Alcohol dependence (HCC) 12/27/2010  . Hyperlipidemia 12/27/2010  . Obesity 07/17/2009  . GERD 07/17/2009   Past Medical History:  Diagnosis Date  . Alcohol dependence (HCC)   . Diabetes (HCC)   . GERD (gastroesophageal reflux disease)   . Hepatic steatosis   . Hyperlipidemia   . Hypertension   . Obesity   . Sinus tachycardia    Family History  Problem Relation Age of Onset  . Hypertension Mother   . Diabetes Mother   . Heart disease Paternal Uncle   . Heart attack Paternal Grandmother   . Colon cancer Neg Hx   . Esophageal cancer Neg Hx   . Stomach cancer Neg Hx    Past Surgical History:  Procedure Laterality Date  .  none     Social History   Occupational History  . Not on file  Tobacco Use  . Smoking status: Never Smoker  . Smokeless tobacco: Never Used  Substance and Sexual Activity  . Alcohol use: Yes    Comment: 3 alcoholic drinks daily  . Drug use: No  . Sexual activity: Not on file

## 2018-03-05 ENCOUNTER — Telehealth: Payer: Self-pay

## 2018-03-05 DIAGNOSIS — E785 Hyperlipidemia, unspecified: Principal | ICD-10-CM

## 2018-03-05 DIAGNOSIS — E1169 Type 2 diabetes mellitus with other specified complication: Secondary | ICD-10-CM

## 2018-03-05 NOTE — Telephone Encounter (Signed)
PA for invokana started on CoverMyMeds KEY: [id number]  PA denied insurance formulary alternatives are farxiga or jardiance

## 2018-03-06 MED ORDER — DAPAGLIFLOZIN PROPANEDIOL 10 MG PO TABS: 10.0000 mg | ORAL_TABLET | Freq: Every day | ORAL | 2 refills | Status: DC

## 2018-03-06 NOTE — Telephone Encounter (Signed)
Sent in farxiga instead.

## 2018-03-06 NOTE — Addendum Note (Signed)
Addended by: Myrlene Broker on: 03/06/2018 07:58 AM   Modules accepted: Orders

## 2018-03-14 NOTE — Addendum Note (Signed)
Addended by: Myrlene Broker on: 03/14/2018 07:39 AM   Modules accepted: Orders

## 2018-03-29 ENCOUNTER — Other Ambulatory Visit: Payer: Self-pay | Admitting: Internal Medicine

## 2018-04-02 ENCOUNTER — Other Ambulatory Visit: Payer: Self-pay | Admitting: Internal Medicine

## 2018-04-13 ENCOUNTER — Other Ambulatory Visit: Payer: Self-pay | Admitting: Internal Medicine

## 2018-04-17 ENCOUNTER — Other Ambulatory Visit: Payer: Self-pay

## 2018-04-17 MED ORDER — TRULICITY 1.5 MG/0.5ML ~~LOC~~ SOAJ: 1.5000 mg | SUBCUTANEOUS | 3 refills | Status: DC

## 2018-04-18 ENCOUNTER — Other Ambulatory Visit (INDEPENDENT_AMBULATORY_CARE_PROVIDER_SITE_OTHER): Payer: 59

## 2018-04-18 DIAGNOSIS — E1169 Type 2 diabetes mellitus with other specified complication: Secondary | ICD-10-CM | POA: Diagnosis not present

## 2018-04-18 DIAGNOSIS — E785 Hyperlipidemia, unspecified: Secondary | ICD-10-CM | POA: Diagnosis not present

## 2018-04-18 LAB — COMPREHENSIVE METABOLIC PANEL
ALK PHOS: 52 U/L (ref 39–117)
ALT: 70 U/L — AB (ref 0–53)
AST: 96 U/L — ABNORMAL HIGH (ref 0–37)
Albumin: 4.7 g/dL (ref 3.5–5.2)
BUN: 12 mg/dL (ref 6–23)
CO2: 28 mEq/L (ref 19–32)
Calcium: 9.5 mg/dL (ref 8.4–10.5)
Chloride: 101 mEq/L (ref 96–112)
Creatinine, Ser: 0.8 mg/dL (ref 0.40–1.50)
GFR: 105.76 mL/min (ref >60.00)
Glucose, Bld: 151 mg/dL — ABNORMAL HIGH (ref 70–99)
Potassium: 3.6 mEq/L (ref 3.5–5.1)
Sodium: 140 mEq/L (ref 135–145)
TOTAL PROTEIN: 8 g/dL (ref 6.0–8.3)
Total Bilirubin: 0.5 mg/dL (ref 0.2–1.2)

## 2018-04-18 LAB — LIPID PANEL
Cholesterol: 163 mg/dL (ref 0–200)
HDL: 37.2 mg/dL — ABNORMAL LOW (ref >39.00)
NonHDL: 126.1
Total CHOL/HDL Ratio: 4
Triglycerides: 273 mg/dL — ABNORMAL HIGH (ref 0.0–149.0)
VLDL: 54.6 mg/dL — ABNORMAL HIGH (ref 0.0–40.0)

## 2018-04-18 LAB — CBC
HCT: 47.9 % (ref 39.0–52.0)
Hemoglobin: 17 g/dL (ref 13.0–17.0)
MCHC: 35.6 g/dL (ref 30.0–36.0)
MCV: 87.7 fl (ref 78.0–100.0)
Platelets: 206 10*3/uL (ref 150.0–400.0)
RBC: 5.46 Mil/uL (ref 4.22–5.81)
RDW: 12.3 % (ref 11.5–15.5)
WBC: 5.2 10*3/uL (ref 4.0–10.5)

## 2018-04-18 LAB — LDL CHOLESTEROL, DIRECT: Direct LDL: 106 mg/dL

## 2018-04-18 LAB — HEMOGLOBIN A1C: Hgb A1c MFr Bld: 7.2 % — ABNORMAL HIGH (ref 4.6–6.5)

## 2018-04-23 ENCOUNTER — Ambulatory Visit (INDEPENDENT_AMBULATORY_CARE_PROVIDER_SITE_OTHER): Payer: 59 | Admitting: Internal Medicine

## 2018-04-23 ENCOUNTER — Encounter: Payer: Self-pay | Admitting: Internal Medicine

## 2018-04-23 VITALS — BP 120/88 | HR 88 | Temp 98.1°F | Ht 69.0 in | Wt 218.0 lb

## 2018-04-23 DIAGNOSIS — E1169 Type 2 diabetes mellitus with other specified complication: Secondary | ICD-10-CM

## 2018-04-23 DIAGNOSIS — K760 Fatty (change of) liver, not elsewhere classified: Secondary | ICD-10-CM

## 2018-04-23 DIAGNOSIS — E785 Hyperlipidemia, unspecified: Secondary | ICD-10-CM | POA: Diagnosis not present

## 2018-04-23 DIAGNOSIS — I10 Essential (primary) hypertension: Secondary | ICD-10-CM | POA: Diagnosis not present

## 2018-04-23 MED ORDER — NYSTATIN-TRIAMCINOLONE 100000-0.1 UNIT/GM-% EX OINT: 1.0000 "application " | TOPICAL_OINTMENT | Freq: Two times a day (BID) | CUTANEOUS | 3 refills | Status: DC

## 2018-04-23 NOTE — Patient Instructions (Signed)
Think about getting back into some exercise to help with the sugars.    Diabetes Mellitus and Standards of Medical Care Managing diabetes (diabetes mellitus) can be complicated. Your diabetes treatment may be managed by a team of health care providers, including:  A physician who specializes in diabetes (endocrinologist).  A nurse practitioner or physician assistant.  Nurses.  A diet and nutrition specialist (registered dietitian).  A certified diabetes educator (CDE).  An exercise specialist.  A pharmacist.  An eye doctor.  A foot specialist (podiatrist).  A dentist.  A primary care provider.  A mental health provider. Your health care providers follow guidelines to help you get the best quality of care. The following schedule is a general guideline for your diabetes management plan. Your health care providers may give you more specific instructions. Physical exams Upon being diagnosed with diabetes mellitus, and each year after that, your health care provider will ask about your medical and family history. He or she will also do a physical exam. Your exam may include:  Measuring your height, weight, and body mass index (BMI).  Checking your blood pressure. This will be done at every routine medical visit. Your target blood pressure may vary depending on your medical conditions, your age, and other factors.  Thyroid gland exam.  Skin exam.  Screening for damage to your nerves (peripheral neuropathy). This may include checking the pulse in your legs and feet and checking the level of sensation in your hands and feet.  A complete foot exam to inspect the structure and skin of your feet, including checking for cuts, bruises, redness, blisters, sores, or other problems.  Screening for blood vessel (vascular) problems, which may include checking the pulse in your legs and feet and checking your temperature. Blood tests Depending on your treatment plan and your personal  needs, you may have the following tests done:  HbA1c (hemoglobin A1c). This test provides information about blood sugar (glucose) control over the previous 2-3 months. It is used to adjust your treatment plan, if needed. This test will be done: ? At least 2 times a year, if you are meeting your treatment goals. ? 4 times a year, if you are not meeting your treatment goals or if treatment goals have changed.  Lipid testing, including total, LDL, and HDL cholesterol and triglyceride levels. ? The goal for LDL is less than 100 mg/dL (5.5 mmol/L). If you are at high risk for complications, the goal is less than 70 mg/dL (3.9 mmol/L). ? The goal for HDL is 40 mg/dL (2.2 mmol/L) or higher for men and 50 mg/dL (2.8 mmol/L) or higher for women. An HDL cholesterol of 60 mg/dL (3.3 mmol/L) or higher gives some protection against heart disease. ? The goal for triglycerides is less than 150 mg/dL (8.3 mmol/L).  Liver function tests.  Kidney function tests.  Thyroid function tests. Dental and eye exams  Visit your dentist two times a year.  If you have type 1 diabetes, your health care provider may recommend an eye exam 3-5 years after you are diagnosed, and then once a year after your first exam. ? For children with type 1 diabetes, a health care provider may recommend an eye exam when your child is age 54 or older and has had diabetes for 3-5 years. After the first exam, your child should get an eye exam once a year.  If you have type 2 diabetes, your health care provider may recommend an eye exam as soon as you  are diagnosed, and then once a year after your first exam. Immunizations   The yearly flu (influenza) vaccine is recommended for everyone 6 months or older who has diabetes.  The pneumonia (pneumococcal) vaccine is recommended for everyone 2 years or older who has diabetes. If you are 61 or older, you may get the pneumonia vaccine as a series of two separate shots.  The hepatitis B  vaccine is recommended for adults shortly after being diagnosed with diabetes.  Adults and children with diabetes should receive all other vaccines according to age-specific recommendations from the Centers for Disease Control and Prevention (CDC). Mental and emotional health Screening for symptoms of eating disorders, anxiety, and depression is recommended at the time of diagnosis and afterward as needed. If your screening shows that you have symptoms (positive screening result), you may need more evaluation and you may work with a mental health care provider. Treatment plan Your treatment plan will be reviewed at every medical visit. You and your health care provider will discuss:  How you are taking your medicines, including insulin.  Any side effects you are experiencing.  Your blood glucose target goals.  The frequency of your blood glucose monitoring.  Lifestyle habits, such as activity level as well as tobacco, alcohol, and substance use. Diabetes self-management education Your health care provider will assess how well you are monitoring your blood glucose levels and whether you are taking your insulin correctly. He or she may refer you to:  A certified diabetes educator to manage your diabetes throughout your life, starting at diagnosis.  A registered dietitian who can create or review your personal nutrition plan.  An exercise specialist who can discuss your activity level and exercise plan. Summary  Managing diabetes (diabetes mellitus) can be complicated. Your diabetes treatment may be managed by a team of health care providers.  Your health care providers follow guidelines in order to help you get the best quality of care.  Standards of care including having regular physical exams, blood tests, blood pressure monitoring, immunizations, screening tests, and education about how to manage your diabetes.  Your health care providers may also give you more specific instructions  based on your individual health. This information is not intended to replace advice given to you by your health care provider. Make sure you discuss any questions you have with your health care provider. Document Released: 11/28/2008 Document Revised: 10/20/2017 Document Reviewed: 10/30/2015 Elsevier Interactive Patient Education  2019 [employer].

## 2018-04-23 NOTE — Assessment & Plan Note (Signed)
BP at goal on bystolic and losartan/hctz. Recent CMP without indication for change.

## 2018-04-23 NOTE — Addendum Note (Signed)
Addended by: Hillard Danker A on: 04/23/2018 11:26 AM   Modules accepted: Orders

## 2018-04-23 NOTE — Assessment & Plan Note (Signed)
LFTs are down slightly. He is aware that alcohol usage in general does cause damage to liver cells. He has cut back some in recent years and is not inclined to cut back more currently. Taking statin. He is aware that exercise can help and we counseled him to start exercising currently.

## 2018-04-23 NOTE — Progress Notes (Signed)
   Subjective:   Patient ID: Peter Keller, male    DOB: 17-Nov-1975, 43 y.o.   MRN: [id number]  HPI The patient is a 43 YO man coming in for follow up of his diabetes (taking metformin, trulicity, glipizide, farxiga, on ARB and statin, denies new numbness or tingling, denies chest pains or headaches or increased thirst or urination), and his fatty liver (taking lipitor and fenofibrate, still drinking alcohol but not drinking "hard liquor" anymore and feels that wine is better for liver numbers, not currently exericsing) and his blood pressure (BP at goal on bystolic and losartan/hctz, denies headaches or chest pains, denies side effects). Wants to discuss results on recent labs.   Review of Systems  Constitutional: Negative.   HENT: Negative.   Eyes: Negative.   Respiratory: Negative for cough, chest tightness and shortness of breath.   Cardiovascular: Negative for chest pain, palpitations and leg swelling.  Gastrointestinal: Negative for abdominal distention, abdominal pain, constipation, diarrhea, nausea and vomiting.  Musculoskeletal: Negative.   Skin: Negative.   Neurological: Negative.   Psychiatric/Behavioral: Negative.     Objective:  Physical Exam Constitutional:      Appearance: He is well-developed. He is obese.  HENT:     Head: Normocephalic and atraumatic.  Neck:     Musculoskeletal: Normal range of motion.  Cardiovascular:     Rate and Rhythm: Normal rate and regular rhythm.  Pulmonary:     Effort: Pulmonary effort is normal. No respiratory distress.     Breath sounds: Normal breath sounds. No wheezing or rales.  Abdominal:     General: Bowel sounds are normal. There is no distension.     Palpations: Abdomen is soft.     Tenderness: There is no abdominal tenderness. There is no rebound.  Skin:    General: Skin is warm and dry.  Neurological:     Mental Status: He is alert and oriented to person, place, and time.     Coordination: Coordination normal.     Vitals:    04/23/18 0859  BP: 120/88  Pulse: 88  Temp: 98.1 F (36.7 C)  TempSrc: Oral  SpO2: 96%  Weight: 218 lb (98.9 kg)  Height: 5\' 9"  (1.753 m)    Assessment & Plan:

## 2018-04-23 NOTE — Assessment & Plan Note (Signed)
Taking statin and ARB. Recent labs checked and reviewed with patient today. At goal on his farxiga, trulicity, glipizide and metformin. Denies low sugars or neuropathy. Reminded about eye exam. Given information about standards of care.

## 2018-04-28 ENCOUNTER — Other Ambulatory Visit: Payer: Self-pay | Admitting: Internal Medicine

## 2018-05-28 ENCOUNTER — Other Ambulatory Visit: Payer: Self-pay | Admitting: Internal Medicine

## 2018-06-27 ENCOUNTER — Other Ambulatory Visit: Payer: Self-pay | Admitting: Internal Medicine

## 2018-07-27 ENCOUNTER — Other Ambulatory Visit: Payer: Self-pay | Admitting: Internal Medicine

## 2018-08-26 ENCOUNTER — Other Ambulatory Visit: Payer: Self-pay | Admitting: Internal Medicine

## 2018-09-24 ENCOUNTER — Other Ambulatory Visit: Payer: Self-pay | Admitting: Internal Medicine

## 2018-09-26 ENCOUNTER — Other Ambulatory Visit: Payer: Self-pay | Admitting: Internal Medicine

## 2018-09-26 NOTE — Telephone Encounter (Signed)
Please advise on note below.   

## 2018-09-26 NOTE — Telephone Encounter (Signed)
Last office visit 01/30/17 and no future appointment scheduled refill or refuse please advise in Dr. Ena Dawley absence

## 2018-09-27 NOTE — Telephone Encounter (Signed)
This has been filled 

## 2018-10-28 ENCOUNTER — Other Ambulatory Visit: Payer: Self-pay | Admitting: Internal Medicine

## 2018-10-29 ENCOUNTER — Other Ambulatory Visit: Payer: Self-pay | Admitting: Internal Medicine

## 2018-10-29 MED ORDER — METFORMIN HCL 1000 MG PO TABS: ORAL_TABLET | ORAL | 3 refills | Status: DC

## 2018-10-29 NOTE — Telephone Encounter (Signed)
I refilled it. He will need an appt, though, within the next 3-4 months.

## 2018-10-29 NOTE — Telephone Encounter (Signed)
Noted. Could you please schedule pt for f/u within the next 3-4 months?

## 2018-10-29 NOTE — Telephone Encounter (Signed)
Patient has not been seen since 2018. Would you like to refill?

## 2018-10-29 NOTE — Telephone Encounter (Signed)
Appointment scheduled for 01/29/19 at 9:30 a.m.

## 2018-10-31 ENCOUNTER — Other Ambulatory Visit: Payer: Self-pay | Admitting: Emergency Medicine

## 2018-10-31 DIAGNOSIS — Z20822 Contact with and (suspected) exposure to covid-19: Secondary | ICD-10-CM

## 2018-11-01 LAB — NOVEL CORONAVIRUS, NAA: SARS-CoV-2, NAA: DETECTED — AB

## 2018-11-10 ENCOUNTER — Other Ambulatory Visit: Payer: Self-pay

## 2018-11-10 ENCOUNTER — Inpatient Hospital Stay (HOSPITAL_COMMUNITY)
Admission: AD | Admit: 2018-11-10 | Discharge: 2018-11-14 | DRG: 177 | Disposition: A | Discharge status: Home / Self Care | Payer: 59 | Attending: Internal Medicine | Admitting: Internal Medicine

## 2018-11-10 ENCOUNTER — Encounter (HOSPITAL_COMMUNITY): Payer: Self-pay

## 2018-11-10 ENCOUNTER — Ambulatory Visit (INDEPENDENT_AMBULATORY_CARE_PROVIDER_SITE_OTHER): Payer: 59

## 2018-11-10 ENCOUNTER — Ambulatory Visit (INDEPENDENT_AMBULATORY_CARE_PROVIDER_SITE_OTHER)
Admission: EM | Admit: 2018-11-10 | Discharge: 2018-11-10 | Disposition: A | Discharge status: Home / Self Care | Payer: 59 | Source: Home / Self Care

## 2018-11-10 DIAGNOSIS — K760 Fatty (change of) liver, not elsewhere classified: Secondary | ICD-10-CM | POA: Diagnosis present

## 2018-11-10 DIAGNOSIS — F1020 Alcohol dependence, uncomplicated: Secondary | ICD-10-CM | POA: Diagnosis present

## 2018-11-10 DIAGNOSIS — J1282 Pneumonia due to coronavirus disease 2019: Secondary | ICD-10-CM

## 2018-11-10 DIAGNOSIS — Z7984 Long term (current) use of oral hypoglycemic drugs: Secondary | ICD-10-CM | POA: Diagnosis not present

## 2018-11-10 DIAGNOSIS — I10 Essential (primary) hypertension: Secondary | ICD-10-CM | POA: Diagnosis present

## 2018-11-10 DIAGNOSIS — Z8619 Personal history of other infectious and parasitic diseases: Secondary | ICD-10-CM

## 2018-11-10 DIAGNOSIS — E1169 Type 2 diabetes mellitus with other specified complication: Secondary | ICD-10-CM | POA: Diagnosis present

## 2018-11-10 DIAGNOSIS — Z79899 Other long term (current) drug therapy: Secondary | ICD-10-CM | POA: Diagnosis not present

## 2018-11-10 DIAGNOSIS — R064 Hyperventilation: Secondary | ICD-10-CM

## 2018-11-10 DIAGNOSIS — E119 Type 2 diabetes mellitus without complications: Secondary | ICD-10-CM

## 2018-11-10 DIAGNOSIS — R918 Other nonspecific abnormal finding of lung field: Secondary | ICD-10-CM

## 2018-11-10 DIAGNOSIS — Z8249 Family history of ischemic heart disease and other diseases of the circulatory system: Secondary | ICD-10-CM

## 2018-11-10 DIAGNOSIS — K219 Gastro-esophageal reflux disease without esophagitis: Secondary | ICD-10-CM | POA: Diagnosis present

## 2018-11-10 DIAGNOSIS — D696 Thrombocytopenia, unspecified: Secondary | ICD-10-CM | POA: Diagnosis present

## 2018-11-10 DIAGNOSIS — R0602 Shortness of breath: Secondary | ICD-10-CM

## 2018-11-10 DIAGNOSIS — U071 COVID-19: Secondary | ICD-10-CM

## 2018-11-10 DIAGNOSIS — J9601 Acute respiratory failure with hypoxia: Secondary | ICD-10-CM | POA: Diagnosis present

## 2018-11-10 DIAGNOSIS — E785 Hyperlipidemia, unspecified: Secondary | ICD-10-CM | POA: Diagnosis present

## 2018-11-10 DIAGNOSIS — J1289 Other viral pneumonia: Secondary | ICD-10-CM

## 2018-11-10 DIAGNOSIS — K529 Noninfective gastroenteritis and colitis, unspecified: Secondary | ICD-10-CM | POA: Diagnosis present

## 2018-11-10 DIAGNOSIS — Z833 Family history of diabetes mellitus: Secondary | ICD-10-CM | POA: Diagnosis not present

## 2018-11-10 LAB — CBC WITH DIFFERENTIAL/PLATELET
Abs Immature Granulocytes: 0.03 10*3/uL (ref 0.00–0.07)
Basophils Absolute: 0 10*3/uL (ref 0.0–0.1)
Basophils Relative: 0 %
Eosinophils Absolute: 0 10*3/uL (ref 0.0–0.5)
Eosinophils Relative: 0 %
HCT: 44.9 % (ref 39.0–52.0)
Hemoglobin: 16.1 g/dL (ref 13.0–17.0)
Immature Granulocytes: 0 %
Lymphocytes Relative: 11 %
Lymphs Abs: 0.9 10*3/uL (ref 0.7–4.0)
MCH: 30.9 pg (ref 26.0–34.0)
MCHC: 35.9 g/dL (ref 30.0–36.0)
MCV: 86.2 fL (ref 80.0–100.0)
Monocytes Absolute: 0.3 10*3/uL (ref 0.1–1.0)
Monocytes Relative: 4 %
Neutro Abs: 7.3 10*3/uL (ref 1.7–7.7)
Neutrophils Relative %: 85 %
Platelets: 149 10*3/uL — ABNORMAL LOW (ref 150–400)
RBC: 5.21 MIL/uL (ref 4.22–5.81)
RDW: 11.8 % (ref 11.5–15.5)
WBC: 8.6 10*3/uL (ref 4.0–10.5)
nRBC: 0 % (ref 0.0–0.2)

## 2018-11-10 LAB — COMPREHENSIVE METABOLIC PANEL
ALT: 29 U/L (ref 0–44)
AST: 31 U/L (ref 15–41)
Albumin: 3.5 g/dL (ref 3.5–5.0)
Alkaline Phosphatase: 47 U/L (ref 38–126)
Anion gap: 10 (ref 5–15)
BUN: 20 mg/dL (ref 6–20)
CO2: 20 mmol/L — ABNORMAL LOW (ref 22–32)
Calcium: 8.6 mg/dL — ABNORMAL LOW (ref 8.9–10.3)
Chloride: 105 mmol/L (ref 98–111)
Creatinine, Ser: 0.73 mg/dL (ref 0.61–1.24)
GFR calc Af Amer: >60 mL/min (ref >60)
GFR calc non Af Amer: >60 mL/min (ref >60)
Glucose, Bld: 393 mg/dL — ABNORMAL HIGH (ref 70–99)
Potassium: 4.1 mmol/L (ref 3.5–5.1)
Sodium: 135 mmol/L (ref 135–145)
Total Bilirubin: 0.8 mg/dL (ref 0.3–1.2)
Total Protein: 7.2 g/dL (ref 6.5–8.1)

## 2018-11-10 LAB — PROCALCITONIN: Procalcitonin: <0.1 ng/mL

## 2018-11-10 LAB — FERRITIN: Ferritin: 333 ng/mL (ref 24–336)

## 2018-11-10 LAB — LACTATE DEHYDROGENASE: LDH: 188 U/L (ref 98–192)

## 2018-11-10 LAB — C-REACTIVE PROTEIN: CRP: 8.8 mg/dL — ABNORMAL HIGH (ref <1.0)

## 2018-11-10 LAB — TYPE AND SCREEN
ABO/RH(D): B POS
Antibody Screen: NEGATIVE

## 2018-11-10 LAB — HEMOGLOBIN A1C
Hgb A1c MFr Bld: 8.7 % — ABNORMAL HIGH (ref 4.8–5.6)
Mean Plasma Glucose: 202.99 mg/dL

## 2018-11-10 LAB — D-DIMER, QUANTITATIVE: D-Dimer, Quant: 0.34 ug/mL-FEU (ref 0.00–0.50)

## 2018-11-10 LAB — BRAIN NATRIURETIC PEPTIDE: B Natriuretic Peptide: 143.3 pg/mL — ABNORMAL HIGH (ref 0.0–100.0)

## 2018-11-10 MED ORDER — INSULIN ASPART 100 UNIT/ML ~~LOC~~ SOLN: 0.0000 [IU] | Freq: Every day | SUBCUTANEOUS | Status: DC

## 2018-11-10 MED ORDER — ACETAMINOPHEN 325 MG PO TABS: 650.0000 mg | ORAL_TABLET | Freq: Four times a day (QID) | ORAL | Status: DC | PRN

## 2018-11-10 MED ORDER — SODIUM CHLORIDE 0.9% FLUSH
3.0000 mL | Freq: Two times a day (BID) | INTRAVENOUS | Status: DC
  Administered 2018-11-11: 3 mL via INTRAVENOUS

## 2018-11-10 MED ORDER — VITAMIN C 500 MG PO TABS
500.0000 mg | ORAL_TABLET | Freq: Every day | ORAL | Status: DC
  Administered 2018-11-10 – 2018-11-14 (×5): 500 mg via ORAL
  Filled 2018-11-10 (×5): qty 1

## 2018-11-10 MED ORDER — INSULIN ASPART 100 UNIT/ML ~~LOC~~ SOLN: 0.0000 [IU] | Freq: Three times a day (TID) | SUBCUTANEOUS | Status: DC

## 2018-11-10 MED ORDER — NEBIVOLOL HCL 5 MG PO TABS
5.0000 mg | ORAL_TABLET | Freq: Every day | ORAL | Status: DC
  Administered 2018-11-11 – 2018-11-12 (×2): 5 mg via ORAL
  Filled 2018-11-10 (×5): qty 1

## 2018-11-10 MED ORDER — ALBUTEROL SULFATE HFA 108 (90 BASE) MCG/ACT IN AERS
2.0000 | INHALATION_SPRAY | RESPIRATORY_TRACT | Status: DC | PRN
  Filled 2018-11-10: qty 6.7

## 2018-11-10 MED ORDER — METHYLPREDNISOLONE SODIUM SUCC 40 MG IJ SOLR
40.0000 mg | Freq: Three times a day (TID) | INTRAMUSCULAR | Status: DC
  Administered 2018-11-10 – 2018-11-11 (×3): 40 mg via INTRAVENOUS
  Filled 2018-11-10 (×3): qty 1

## 2018-11-10 MED ORDER — FUROSEMIDE 10 MG/ML IJ SOLN
20.0000 mg | Freq: Once | INTRAMUSCULAR | Status: AC
  Administered 2018-11-10: 20 mg via INTRAVENOUS
  Filled 2018-11-10: qty 2

## 2018-11-10 MED ORDER — ENOXAPARIN SODIUM 40 MG/0.4ML ~~LOC~~ SOLN
40.0000 mg | SUBCUTANEOUS | Status: DC
  Administered 2018-11-10: 40 mg via SUBCUTANEOUS
  Filled 2018-11-10: qty 0.4

## 2018-11-10 MED ORDER — ZINC SULFATE 220 (50 ZN) MG PO CAPS
220.0000 mg | ORAL_CAPSULE | Freq: Every day | ORAL | Status: DC
  Administered 2018-11-10 – 2018-11-14 (×5): 220 mg via ORAL
  Filled 2018-11-10 (×5): qty 1

## 2018-11-10 MED ORDER — SODIUM CHLORIDE 0.9% IV SOLUTION
Freq: Once | INTRAVENOUS | Status: AC
  Administered 2018-11-11: 04:00:00 via INTRAVENOUS

## 2018-11-10 MED ORDER — ADULT MULTIVITAMIN W/MINERALS CH
1.0000 | ORAL_TABLET | Freq: Every day | ORAL | Status: DC
  Administered 2018-11-10 – 2018-11-14 (×5): 1 via ORAL
  Filled 2018-11-10 (×5): qty 1

## 2018-11-10 MED ORDER — ACETAMINOPHEN 325 MG PO TABS
650.0000 mg | ORAL_TABLET | Freq: Once | ORAL | Status: AC
  Administered 2018-11-11: 650 mg via ORAL
  Filled 2018-11-10: qty 2

## 2018-11-10 MED ORDER — INSULIN ASPART 100 UNIT/ML ~~LOC~~ SOLN
0.0000 [IU] | Freq: Three times a day (TID) | SUBCUTANEOUS | Status: DC
  Administered 2018-11-10: 2 [IU] via SUBCUTANEOUS
  Administered 2018-11-11: 1 [IU] via SUBCUTANEOUS
  Administered 2018-11-11: 2 [IU] via SUBCUTANEOUS

## 2018-11-10 MED ORDER — SODIUM CHLORIDE 0.9% FLUSH: 3.0000 mL | INTRAVENOUS | Status: DC | PRN

## 2018-11-10 MED ORDER — SODIUM CHLORIDE 0.9 % IV SOLN
100.0000 mg | INTRAVENOUS | Status: AC
  Administered 2018-11-11 – 2018-11-14 (×4): 100 mg via INTRAVENOUS
  Filled 2018-11-10 (×5): qty 20

## 2018-11-10 MED ORDER — MELATONIN 3 MG PO TABS
3.0000 mg | ORAL_TABLET | Freq: Every evening | ORAL | Status: DC | PRN
  Filled 2018-11-10: qty 1

## 2018-11-10 MED ORDER — ONDANSETRON HCL 4 MG/2ML IJ SOLN: 4.0000 mg | Freq: Four times a day (QID) | INTRAMUSCULAR | Status: DC | PRN

## 2018-11-10 MED ORDER — DIPHENHYDRAMINE HCL 50 MG/ML IJ SOLN
25.0000 mg | Freq: Once | INTRAMUSCULAR | Status: AC
  Administered 2018-11-11: 03:00:00 25 mg via INTRAVENOUS
  Filled 2018-11-10: qty 1

## 2018-11-10 MED ORDER — INSULIN GLARGINE 100 UNIT/ML ~~LOC~~ SOLN
12.0000 [IU] | Freq: Two times a day (BID) | SUBCUTANEOUS | Status: DC
  Administered 2018-11-10 – 2018-11-12 (×4): 12 [IU] via SUBCUTANEOUS
  Filled 2018-11-10 (×5): qty 0.12

## 2018-11-10 MED ORDER — FOLIC ACID 1 MG PO TABS
1.0000 mg | ORAL_TABLET | Freq: Every day | ORAL | Status: DC
  Administered 2018-11-10 – 2018-11-14 (×5): 1 mg via ORAL
  Filled 2018-11-10 (×5): qty 1

## 2018-11-10 MED ORDER — ONDANSETRON HCL 4 MG PO TABS: 4.0000 mg | ORAL_TABLET | Freq: Four times a day (QID) | ORAL | Status: DC | PRN

## 2018-11-10 MED ORDER — SODIUM CHLORIDE 0.9 % IV SOLN: 250.0000 mL | INTRAVENOUS | Status: DC | PRN

## 2018-11-10 MED ORDER — SODIUM CHLORIDE 0.9 % IV SOLN
200.0000 mg | Freq: Once | INTRAVENOUS | Status: AC
  Administered 2018-11-10: 200 mg via INTRAVENOUS
  Filled 2018-11-10: qty 40

## 2018-11-10 MED ORDER — NON FORMULARY: 20.0000 mg | Freq: Every morning | Status: DC

## 2018-11-10 MED ORDER — HYDROCOD POLST-CPM POLST ER 10-8 MG/5ML PO SUER: 5.0000 mL | Freq: Two times a day (BID) | ORAL | Status: DC | PRN

## 2018-11-10 MED ORDER — POLYETHYLENE GLYCOL 3350 17 G PO PACK: 17.0000 g | PACK | Freq: Every day | ORAL | Status: DC | PRN

## 2018-11-10 NOTE — Discharge Instructions (Addendum)
I recommend that you report to the hospital immediately. I understand that you prefer to go to [facility]. They will be able to see your chest x-ray result as they are a part of [place].

## 2018-11-10 NOTE — H&P (Signed)
HISTORY AND PHYSICAL       PATIENT DETAILS Name: Peter Keller Age: 43 y.o. Sex: male Date of Birth: Dec 10, 1975 Admit Date: 11/10/2018 JOA:[id number], Real Cons, MD   Patient coming from: Home as a direct admission   CHIEF COMPLAINT:  Exertional dyspnea starting today.  HPI: Peter Keller is a 43 y.o. male with medical history significant of DM-2, HTN, fatty liver, EtOH use (claims last drink was on 9/27) who was diagnosed with COVID-19 on 9/17-presented to the hospital today for evaluation of worsening exertional dyspnea.  Patient was seen at a local urgent care where he was found to have pneumonia on chest x-ray.  He was subsequently admitted to this facility for further evaluation and treatment.  Per patient-around 9/16-he started having fever with chills and generalized myalgias.  On 9/23 he had a few bouts of diarrhea.  He was initially on prednisone for 5 days, and then started taking Decadron 2-3 days back.  2-3 days back-he started having cough.  This morning-patient started having exertional dyspnea-he checked his O2 saturation after ambulation-and it was around 84%.  He subsequently went to a local urgent care where he was found to have x-ray evidence of pneumonia.  Patient denies any ongoing nausea, vomiting, diarrhea.  He has no rash.  No fever for the past few days.  He has no chest pain.  Note: Lives at: Home Mobility: Independent Chronic Indwelling Foley:no   REVIEW OF SYSTEMS:  Constitutional:   No  weight loss, night sweats, fatigue.  HEENT:    No headaches, Dysphagia,Tooth/dental problems,Sore throat,   Cardio-vascular: No chest pain,Orthopnea, PND,lower extremity edema, anasarca, palpitations  GI:  No heartburn, indigestion, abdominal pain, nausea, vomiting, diarrhea, melena or hematochezia  Resp: No shortness of breath at rest, cough, hemoptysis,plueritic chest pain.   Skin:  No rash or lesions.  GU:  No dysuria, change in color of  urine, no urgency or frequency.  No flank pain.  Musculoskeletal: No joint pain or swelling.  No decreased range of motion.  No back pain.  Endocrine: No heat intolerance, no cold intolerance, no polyuria, no polydipsia  Psych: No change in mood or affect. No depression or anxiety.  No memory loss.   ALLERGIES:  No Known Allergies  PAST MEDICAL HISTORY: Past Medical History:  Diagnosis Date  . Alcohol dependence ([place])   . Diabetes ([place])   . GERD (gastroesophageal reflux disease)   . Hepatic steatosis   . Hyperlipidemia   . Hypertension   . Obesity   . Sinus tachycardia     PAST SURGICAL HISTORY: Past Surgical History:  Procedure Laterality Date  . none      MEDICATIONS AT HOME: Prior to Admission medications   Medication Start Date End Date Taking? Authorizing Provider  atorvastatin (LIPITOR) 20 MG tablet 09/26/18   Hoyt Koch, MD  Blood Glucose Monitoring Suppl [facility] VERIO IQ SYSTEM) w/Device KIT 12/30/15   Philemon Kingdom, MD  buPROPion (WELLBUTRIN XL) 150 MG 24 hr tablet 07/27/18   Hoyt Koch, MD  BYSTOLIC 5 MG tablet 1/60/10   Evans Lance, MD  Cholecalciferol (VITAMIN D) 400 UNITS capsule    [provider]  Continuous Blood Gluc Receiver (FREESTYLE LIBRE READER) [place] 03/16/16   Philemon Kingdom, MD  Continuous Blood Gluc Sensor ([place]) [place] 03/16/16   Philemon Kingdom, MD  dapagliflozin propanediol (FARXIGA) 10 MG TABS tablet 03/06/18   Hoyt Koch, MD  esomeprazole ([place]) 20  MG capsule    [provider]  fenofibrate (TRICOR) 145 MG tablet 07/27/18   Hoyt Koch, MD  glipiZIDE (GLUCOTROL) 5 MG tablet 07/27/18   Hoyt Koch, MD  losartan-hydrochlorothiazide [facility] 100-25 MG tablet 07/27/18   Hoyt Koch, MD  metFORMIN (GLUCOPHAGE) 1000 MG tablet 10/29/18   Philemon Kingdom, MD  nystatin-triamcinolone ointment [facility]) 04/23/18   Hoyt Koch,  MD  [facility] LANCETS 02H MISC 08/23/16   Philemon Kingdom, MD  [facility] VERIO test strip 08/23/16   Philemon Kingdom, MD  TRULICITY 1.5 EN/2.7PO [facility] 04/17/18   Hoyt Koch, MD    FAMILY HISTORY: Family History  Problem Relation Age of Onset  . Hypertension Mother   . Diabetes Mother   . Heart disease Paternal Uncle   . Heart attack Paternal Grandmother   . Colon cancer Neg Hx   . Esophageal cancer Neg Hx   . Stomach cancer Neg Hx      SOCIAL HISTORY:  reports that he has never smoked. He has never used smokeless tobacco. He reports current alcohol use. He reports that he does not use drugs.  PHYSICAL EXAM: There were no vitals taken for this visit.  General appearance :Awake, alert, not in any distress.  Eyes:, pupils equally reactive to light and accomodation,no scleral icterus.Pink conjunctiva HEENT: Atraumatic and Normocephalic Neck: supple, no JVD.  Resp:Good air entry bilaterally, no added sounds  CVS: S1 S2 regular, no murmurs.  GI: Bowel sounds present, Non tender and not distended with no gaurding, rigidity or rebound. Extremities: B/L Lower Ext shows no edema, both legs are warm to touch Neurology:  speech clear,Non focal, sensation is grossly intact. Psychiatric: Normal judgment and insight. Alert and oriented x 3. Normal mood. Musculoskeletal:gait appears to be normal.No digital cyanosis Skin:No Rash, warm and dry Wounds:N/A  LABS ON ADMISSION:  I have personally reviewed following labs and imaging studies  CBC: No results for input(s): WBC, NEUTROABS, HGB, HCT, MCV, PLT in the last 168 hours.  Basic Metabolic Panel: No results for input(s): NA, K, CL, CO2, GLUCOSE, BUN, CREATININE, CALCIUM, MG, PHOS in the last 168 hours.  GFR: CrCl cannot be calculated (Patient's most recent lab result is older than the maximum 21 days allowed.).  Liver Function Tests: No results for input(s): AST, ALT, ALKPHOS, BILITOT, PROT, ALBUMIN in the last 168  hours. No results for input(s): LIPASE, AMYLASE in the last 168 hours. No results for input(s): AMMONIA in the last 168 hours.  Coagulation Profile: No results for input(s): INR, PROTIME in the last 168 hours.  Cardiac Enzymes: No results for input(s): CKTOTAL, CKMB, CKMBINDEX, TROPONINI in the last 168 hours.  BNP (last 3 results) No results for input(s): PROBNP in the last 8760 hours.  HbA1C: No results for input(s): HGBA1C in the last 72 hours.  CBG: No results for input(s): GLUCAP in the last 168 hours.  Lipid Profile: No results for input(s): CHOL, HDL, LDLCALC, TRIG, CHOLHDL, LDLDIRECT in the last 72 hours.  Thyroid Function Tests: No results for input(s): TSH, T4TOTAL, FREET4, T3FREE, THYROIDAB in the last 72 hours.  Anemia Panel: No results for input(s): VITAMINB12, FOLATE, FERRITIN, TIBC, IRON, RETICCTPCT in the last 72 hours.  Urine analysis:    Component Value Date/Time   COLORURINE LT. YELLOW 07/23/2009 [place] 07/23/2009 0946   LABSPEC >=1.030 07/23/2009 0946   PHURINE 6.0 07/23/2009 0946   GLUCOSEU NEGATIVE 07/23/2009 0946   BILIRUBINUR negative 10/30/2015 1211   KETONESUR  NEGATIVE 07/23/2009 0946   PROTEINUR positive 10/30/2015 1211   UROBILINOGEN 0.2 10/30/2015 1211   UROBILINOGEN 0.2 07/23/2009 0946   NITRITE negative 10/30/2015 1211   NITRITE NEGATIVE 07/23/2009 0946   LEUKOCYTESUR Negative 10/30/2015 1211    Sepsis Labs: Lactic Acid, Venous No results found for: [place]   Microbiology: No results found for this or any previous visit (from the past 240 hour(s)).    RADIOLOGIC STUDIES ON ADMISSION: Dg Chest 2 View  Result Date: 11/10/2018 CLINICAL DATA:  COVID-19 positive.  Shortness of breath. EXAM: CHEST - 2 VIEW COMPARISON:  July 17, 2009 FINDINGS: Bilateral pulmonary infiltrates, left greater than right, consistent with the patient's history of COVID-19. No other acute abnormalities are identified. IMPRESSION:  Bilateral pulmonary infiltrates, left greater than right, consistent with reported history of COVID-19. Electronically Signed   By: Dorise Bullion III M.D   On: 11/10/2018 15:42    I have personally reviewed images of chest xray (bilateral interstitial infiltrates)  EKG:   Pending  ASSESSMENT AND PLAN: Acute hypoxic respiratory failure secondary to COVID-19 pneumonia: Hypoxic with ambulation-but does not appear to have hypoxia at rest.  He appears very comfortable and not in any sort of respiratory distress.  Will await inflammatory markers and labs including chemistries-but at this point we will tentatively plan on intravenous Solu-Medrol, Remdesivir and convalescent plasma.  If he deteriorates-we will plan on giving him Actemra.  Please note-this MD explained rationale, risk and benefits for convalescent plasma-he consents.  Please note-this MD explained the rationale, risks, benefits for the off label use of Actemra if he deteriorates.  He has no history of pulmonary TB or hepatitis C.  DM-2: Per patient-although he is on metformin, Farxiga, glipizide and Trulicity-he has not been taking these medications with the exception of metformin-as his sugars dropped to the 60s once he stopped drinking alcohol approximately 2 weeks back.  Since patient will be on steroids-we will plan to monitor him with Lantus 12 units twice daily and sliding scale insulin.  HTN: Continue Bystolic-hold other agents-follow and adjust accordingly  GERD: Continue PPI  Dyslipidemia: Hold statins until LFTs available.  Fatty liver: Await LFTs-patient has stopped drinking approximately 2 weeks back-see below.  History of alcohol dependence: Per patient-he quit approximately 2 weeks back-his last drink was around 9/7.  This was confirmed by patient's spouse.  We will watch for withdrawal symptoms s-but he should be out of the window at this point.  Further plan will depend as patient's clinical course evolves and  further radiologic and laboratory data become available. Patient will be monitored closely.  Above noted plan was discussed with patient face to face at bedside-he was in agreement  CONSULTS: None   DVT Prophylaxis: Prophylactic Lovenox  Code Status: Full Code  Disposition Plan:  Discharge back home  possibly in 5 days, depending on clinical course  Admission status:  Inpatient  going to progressive care  The medical decision making on this patient was of high complexity and the patient is at high risk for clinical deterioration, therefore this is a level 3 visit.    Total time spent  55 minutes.Greater than 50% of this time was spent in counseling, explanation of diagnosis, planning of further management, and coordination of care.  Severity of illness: The appropriate patient status for this patient is INPATIENT. Inpatient status is judged to be reasonable and necessary in order to provide the required intensity of service to ensure the patient's safety. The patient's presenting symptoms, physical exam  findings, and initial radiographic and laboratory data in the context of their chronic comorbidities is felt to place them at high risk for further clinical deterioration. Furthermore, it is not anticipated that the patient will be medically stable for discharge from the hospital within 2 midnights of admission. The following factors support the patient status of inpatient.   " The patient's presenting symptoms include exertional dyspnea, hypoxemia with exertion " The worrisome physical exam findings include mild tachypnea with exertion. " The initial radiographic and laboratory data are worrisome because of pneumonia " The chronic co-morbidities include DM-2, HTN, dyslipidemia, fatty liver  * I certify that at the point of admission it is my clinical judgment that the patient will require inpatient hospital care spanning beyond 2 midnights from the point of admission due to high  intensity of service, high risk for further deterioration and high frequency of surveillance required.  Oren Binet [facility] Pager [phone]  If 7PM-7AM, please contact night-coverage  Please page via [email or web address]  Go to [email or web address] and use [place]'s universal password to access. If you do not have the password, please contact the hospital operator.  Locate the [facility] provider you are looking for under [facility] and page to a number that you can be directly reached. If you still have difficulty reaching the provider, please page the [facility] (Director on Call) for the Hospitalists listed on amion for assistance.  11/10/2018, 5:15 PM

## 2018-11-10 NOTE — ED Notes (Signed)
Patient keeps opening the door because he wants to know how long it will take for him to be seeing.

## 2018-11-10 NOTE — ED Provider Notes (Signed)
MRN: [id number] DOB: [birth year]  Subjective:   Peter Keller is a 43 y.o. male presenting for check on COVID 19 sx. Was diagnosed and tested positive on 10/31/2018, at the time had mild symptoms. Had initially started having sx on 10/30/2018 with fever, chills. Generally has had mild and intermittent worsening shob, had acute and sudden worsening this morning. Has been monitoring his oxygen levels, started out doing well but has been increasingly short of breath in the past day. His pulse oximetry has dropped as low as 85% and has gone up to 94%. Has been taking prednisone given to him by a provider known to him through his wife, a hospitalist at [facility]. Has also been using Decadron, tessalon by same providers. He is a diabetic, came off of his diabetes medications due to hypoglycemia in the past few weeks. He quit drinking alcohol on 10/22/2018 and his blood sugars have improved dramatically.  Has HTN, is only taking Bystolic now due to bp improving when he quit drinking alcohol.    No Known Allergies   Past Medical History:  Diagnosis Date  . Alcohol dependence ([facility])   . Diabetes ([facility])   . GERD (gastroesophageal reflux disease)   . Hepatic steatosis   . Hyperlipidemia   . Hypertension   . Obesity   . Sinus tachycardia      Past Surgical History:  Procedure Laterality Date  . none      Family History  Problem Relation Age of Onset  . Hypertension Mother   . Diabetes Mother   . Heart disease Paternal Uncle   . Heart attack Paternal Grandmother   . Colon cancer Neg Hx   . Esophageal cancer Neg Hx   . Stomach cancer Neg Hx      Review of Systems  Constitutional: Positive for chills, fever and malaise/fatigue.  HENT: Negative for congestion, ear pain, sinus pain and sore throat.        Intermittent runny nose with his fever.   Eyes: Negative for discharge and redness.  Respiratory: Positive for cough (~2 days ago, intermittently productive but mostly dry) and shortness of  breath. Negative for hemoptysis and wheezing.   Cardiovascular: Negative for chest pain.  Gastrointestinal: Negative for abdominal pain, blood in stool, diarrhea, nausea and vomiting.  Genitourinary: Negative for dysuria, flank pain and hematuria.  Musculoskeletal: Negative for myalgias.  Skin: Negative for rash.  Neurological: Negative for dizziness, weakness and headaches.       Difficulty concentrating this week.  Psychiatric/Behavioral: Negative for substance abuse.       History of heavy alcohol use, stopped drinking alcohol on 10/22/2018.    Social History   Tobacco Use  . Smoking status: Never Smoker  . Smokeless tobacco: Never Used  Substance Use Topics  . Alcohol use: Yes    Comment: 3 alcoholic drinks daily  . Drug use: No     Objective:   Vitals: BP 119/76 (BP Location: Right Arm)   Pulse 89   Temp 98.3 F (36.8 C) (Oral)   Resp 20   SpO2 93%   Physical Exam Constitutional:      General: He is not in acute distress.    Appearance: Normal appearance. He is well-developed. He is not ill-appearing, toxic-appearing or diaphoretic.  HENT:     Head: Normocephalic and atraumatic.     Right Ear: External ear normal.     Left Ear: External ear normal.     Nose: Nose normal.     Mouth/Throat:  Mouth: Mucous membranes are moist.     Pharynx: Oropharynx is clear.  Eyes:     General: No scleral icterus.    Extraocular Movements: Extraocular movements intact.     Pupils: Pupils are equal, round, and reactive to light.  Cardiovascular:     Rate and Rhythm: Normal rate and regular rhythm.     Heart sounds: Normal heart sounds. No murmur. No friction rub. No gallop.   Pulmonary:     Effort: No accessory muscle usage or respiratory distress.     Breath sounds: Normal breath sounds. No stridor. No wheezing, rhonchi or rales.     Comments: Patient has labored breathing, has to pause in between sentences for his breath. Neurological:     Mental Status: He is alert  and oriented to person, place, and time.  Psychiatric:        Mood and Affect: Mood normal. Mood is not anxious.        Behavior: Behavior normal. Behavior is not agitated.        Thought Content: Thought content normal.     Dg Chest 2 View  Result Date: 11/10/2018 CLINICAL DATA:  COVID-19 positive.  Shortness of breath. EXAM: CHEST - 2 VIEW COMPARISON:  July 17, 2009 FINDINGS: Bilateral pulmonary infiltrates, left greater than right, consistent with the patient's history of COVID-19. No other acute abnormalities are identified. IMPRESSION: Bilateral pulmonary infiltrates, left greater than right, consistent with reported history of COVID-19. Electronically Signed   By: Dorise Bullion III M.D   On: 11/10/2018 15:42    Assessment and Plan :   1. Bilateral pulmonary infiltrates on CXR   2. COVID-19 virus infection   3. Shortness of breath   4. Essential hypertension   5. Well controlled type 2 diabetes mellitus ([place])   6. Labored breathing     Patient is declining through his COVID-19 illness and I am concerned that he is at risk of acute respiratory distress.  Currently he is at 93% pulse oximetry, not hypotensive or tachycardic, afebrile and not tachypneic.  However, patient does have labored breathing and has bilateral pulmonary infiltrates.  Therefore, I have recommended patient report to the [facility] ER for emergent evaluation and consideration for hospitalization. However, patient refused and stated he would drive home, acknowledge risk involved in possible further decline in his breathing on the road. Contracted for safety, will have his wife drive him to the [facility] thereafter. Case discussed with Medical Director, Dr. Lanny Cramp.    Jaynee Eagles, [place] 11/10/18 [phone]

## 2018-11-10 NOTE — Progress Notes (Signed)
Inpatient Diabetes Program Recommendations  AACE/ADA: New Consensus Statement on Inpatient Glycemic Control (2015)  Target Ranges:  Prepandial:   less than 140 mg/dL      Peak postprandial:   less than 180 mg/dL (1-2 hours)      Critically ill patients:  140 - 180 mg/dL   Lab Results  Component Value Date   HGBA1C 7.2 (H) 04/18/2018    Review of Glycemic Control  Diabetes history: DM2 Outpatient Diabetes medications: metformin 1000 mg bid, glipizide 5 mg QD, Farxiga 10 mg QD, Trulicity 1.5 mg Q weekly. Current orders for Inpatient glycemic control: Lantus 12 units bid, Novolog 0-9 units tidwc.  On Solumedrol 40 mg Q8H HgbA1C pending. Glucose - 393 mg/dL. Using [place] for inpatient monitoring.  Inpatient Diabetes Program Recommendations:     May need Novolog meal coverage while on steroids - 3 units tidwc if eating > 50% meal. Titrate if over goal of 140-180.  Follow closely.  Thank you. Lorenda Peck, RD, LDN, CDE Inpatient Diabetes Coordinator [phone]

## 2018-11-10 NOTE — ED Notes (Signed)
Patient approached desk.  Escorted patient back to treatment room.  Patient has had xray already.  reiterated time frame for x-rays to by read as previously discussed by radiology.  Radiologist to read film, provider to see films and radiologist report and notify patient of best plan of care

## 2018-11-10 NOTE — Progress Notes (Signed)
Pt was admitted for COVID. He is on RA but has a positive CXR. His ALT is wnl. Remdesivir has been ordered tonight.  Remdesivir 200mg  IV x1 then 100mg  IV q24 x4 F/u with ALT  Onnie Boer, PharmD, BCIDP, AAHIVP, CPP Infectious Disease Pharmacist 11/10/2018 8:37 PM

## 2018-11-10 NOTE — ED Triage Notes (Signed)
Patient reports he tested positive for Covid on 11/01/2018, he has diarrhea, chills, body aches, shortness of breath,  his oxigen level dropped to 85% today.  Patient  wants CBC with diff, CMET, Ferritin, LDH, CRP, D dimer, Procalcitonin and chest Xray.

## 2018-11-11 ENCOUNTER — Encounter (HOSPITAL_COMMUNITY): Payer: Self-pay | Admitting: *Deleted

## 2018-11-11 ENCOUNTER — Other Ambulatory Visit: Payer: Self-pay

## 2018-11-11 LAB — COMPREHENSIVE METABOLIC PANEL
ALT: 28 U/L (ref 0–44)
AST: 36 U/L (ref 15–41)
Albumin: 3.6 g/dL (ref 3.5–5.0)
Alkaline Phosphatase: 53 U/L (ref 38–126)
Anion gap: 12 (ref 5–15)
BUN: 21 mg/dL — ABNORMAL HIGH (ref 6–20)
CO2: 24 mmol/L (ref 22–32)
Calcium: 8.9 mg/dL (ref 8.9–10.3)
Chloride: 102 mmol/L (ref 98–111)
Creatinine, Ser: 0.73 mg/dL (ref 0.61–1.24)
GFR calc Af Amer: >60 mL/min (ref >60)
GFR calc non Af Amer: >60 mL/min (ref >60)
Glucose, Bld: 371 mg/dL — ABNORMAL HIGH (ref 70–99)
Potassium: 4.3 mmol/L (ref 3.5–5.1)
Sodium: 138 mmol/L (ref 135–145)
Total Bilirubin: 1 mg/dL (ref 0.3–1.2)
Total Protein: 7.7 g/dL (ref 6.5–8.1)

## 2018-11-11 LAB — PREPARE FRESH FROZEN PLASMA

## 2018-11-11 LAB — BPAM FFP
Blood Product Expiration Date: 202009272220
ISSUE DATE / TIME: 202009262314
Unit Type and Rh: 7300

## 2018-11-11 LAB — FERRITIN: Ferritin: 414 ng/mL — ABNORMAL HIGH (ref 24–336)

## 2018-11-11 LAB — HIV ANTIBODY (ROUTINE TESTING W REFLEX): HIV Screen 4th Generation wRfx: NONREACTIVE

## 2018-11-11 LAB — BRAIN NATRIURETIC PEPTIDE: B Natriuretic Peptide: 153.7 pg/mL — ABNORMAL HIGH (ref 0.0–100.0)

## 2018-11-11 LAB — C-REACTIVE PROTEIN
CRP: 11.5 mg/dL — ABNORMAL HIGH (ref <1.0)
CRP: 11.6 mg/dL — ABNORMAL HIGH (ref <1.0)

## 2018-11-11 LAB — ABO/RH: ABO/RH(D): B POS

## 2018-11-11 MED ORDER — FUROSEMIDE 10 MG/ML IJ SOLN
40.0000 mg | Freq: Once | INTRAMUSCULAR | Status: AC
  Administered 2018-11-11: 12:00:00 40 mg via INTRAVENOUS
  Filled 2018-11-11: qty 4

## 2018-11-11 MED ORDER — TOCILIZUMAB 400 MG/20ML IV SOLN
8.0000 mg/kg | Freq: Once | INTRAVENOUS | Status: AC
  Administered 2018-11-11: 16:00:00 762 mg via INTRAVENOUS
  Filled 2018-11-11: qty 38.1

## 2018-11-11 MED ORDER — INSULIN ASPART 100 UNIT/ML ~~LOC~~ SOLN
0.0000 [IU] | Freq: Three times a day (TID) | SUBCUTANEOUS | Status: DC
  Administered 2018-11-11: 17:00:00 2 [IU] via SUBCUTANEOUS
  Administered 2018-11-12: 08:00:00 1 [IU] via SUBCUTANEOUS

## 2018-11-11 MED ORDER — ENOXAPARIN SODIUM 40 MG/0.4ML ~~LOC~~ SOLN
40.0000 mg | SUBCUTANEOUS | Status: DC
  Administered 2018-11-11 – 2018-11-13 (×3): 40 mg via SUBCUTANEOUS
  Filled 2018-11-11 (×3): qty 0.4

## 2018-11-11 MED ORDER — PANTOPRAZOLE SODIUM 40 MG PO TBEC
40.0000 mg | DELAYED_RELEASE_TABLET | Freq: Two times a day (BID) | ORAL | Status: DC
  Administered 2018-11-11 – 2018-11-14 (×7): 40 mg via ORAL
  Filled 2018-11-11 (×7): qty 1

## 2018-11-11 MED ORDER — MELATONIN 3 MG PO TABS
3.0000 mg | ORAL_TABLET | Freq: Every day | ORAL | Status: DC
  Administered 2018-11-11 – 2018-11-12 (×2): 3 mg via ORAL
  Filled 2018-11-11 (×3): qty 1

## 2018-11-11 MED ORDER — INSULIN ASPART 100 UNIT/ML ~~LOC~~ SOLN: 0.0000 [IU] | Freq: Every day | SUBCUTANEOUS | Status: DC

## 2018-11-11 MED ORDER — METHYLPREDNISOLONE SODIUM SUCC 125 MG IJ SOLR
60.0000 mg | Freq: Two times a day (BID) | INTRAMUSCULAR | Status: DC
  Administered 2018-11-11 – 2018-11-12 (×2): 60 mg via INTRAVENOUS
  Filled 2018-11-11 (×2): qty 2

## 2018-11-11 NOTE — Progress Notes (Signed)
Telephone call to patient's contact, Dr. Tana Coast, updated on plan of care and answered questions.

## 2018-11-11 NOTE — Progress Notes (Signed)
Pt is on Nexium at home. Due to our policy, we have to substitute pantoprazole for it. D/w Dr. Candiss Norse, we will do BID pantoprazole while he is here and use Nexium as the last resort.   Onnie Boer, PharmD, BCIDP, AAHIVP, CPP Infectious Disease Pharmacist 11/11/2018 12:30 PM

## 2018-11-11 NOTE — Progress Notes (Signed)
PROGRESS NOTE                                                                                                                                                                                                             Patient Demographics:    Bentley Fissel, is a 43 y.o. male, DOB - 11-Sep-1975, RUE:[id number]  Outpatient Primary MD for the patient is Hoyt Koch, MD    LOS - 1  Admit date - 11/10/2018    No chief complaint on file.      Brief Narrative  - Niko Penson is a 43 y.o. male with medical history significant of DM-2, HTN, fatty liver, EtOH in the past, who was diagnosed with COVID-19 infection on 11/01/2018 and subsequently developed some gastroenteritis.  He started developing some shortness of breath on exertion and had an outpatient x-ray showing pneumonia around 11/08/18, he was started outpatient on oral steroids but symptoms continue to flare and he was admitted to the hospital with acute hypoxic respiratory failure due to COVID-19 pneumonitis.   Subjective:    Anne Wanninger today has, No headache, No chest pain, No abdominal pain - No Nausea, No new weakness tingling or numbness, No Cough - but +ve SOB.     Assessment  & Plan :     1. Acute Hypoxic Resp. Failure due to Acute Covid 19 Viral Pneumonitis during the ongoing 2020 Covid 19 Pandemic -  Has failed outpatient steroid treatment, CRP is climbing despite steroid use for the last several days, he has progressed from exertional shortness of breath to hypoxia at rest and now requiring 2 L nasal cannula oxygen and increased work of breathing.  He has already received IV steroid, Remdisvir and convalescent plasma on 11/10/2018.  He will get IV Actemra on 11/11/2018.  Further encouraged him to sit up in chair in the daytime use I-S and flutter valve for pulmonary toiletry and prone in bed when sleeping.  Will monitor closely.   Actemra off label use -  patient and his wife who is a physician were told that if COVID-19 pneumonitis gets worse we might potentially use Actemra off label, patient and wife deny any known history of tuberculosis or hepatitis, understand the risks and benefits and wants to proceed with Actemra treatment.   COVID-19 Labs  Recent Labs    11/10/18  1742 11/11/18 0845  DDIMER 0.34  --   FERRITIN 333 414*  LDH 188  --   CRP 8.8* 11.5*    Lab Results  Component Value Date   SARSCOV2NAA Detected (A) 10/31/2018     Hepatic Function Latest Ref Rng & Units 11/11/2018 11/10/2018 04/18/2018  Total Protein 6.5 - 8.1 g/dL 7.7 7.2 8.0  Albumin 3.5 - 5.0 g/dL 3.6 3.5 4.7  AST 15 - 41 U/L 36 31 96(H)  ALT 0 - 44 U/L 28 29 70(H)  Alk Phosphatase 38 - 126 U/L 53 47 52  Total Bilirubin 0.3 - 1.2 mg/dL 1.0 0.8 0.5  Bilirubin, Direct 0.0 - 0.3 mg/dL - - -        Component Value Date/Time   BNP 153.7 (H) 11/11/2018 0845      2.  History of fatty liver.  Outpatient follow-up with PCP.   3.  Past history of alcohol abuse.  Has quit by himself several weeks ago, no signs of DTs will monitor.   4.  GERD.  On PPI.   5.  History of dyslipidemia.  Statins once clinically improved and liver function remained stable.    6.  Essential hypertension.  On beta-blocker and stable.    7.  Mild thrombocytopenia.  Due to underlying fatty liver, possibly acute COVID-19 viral infection also contributing to it.  No signs of bleeding will monitor.   8.  DM type II.  Currently on Lantus along with sliding scale, poor outpatient control due to hyperglycemia.  Defer outpatient management to PCP.  Lab Results  Component Value Date   HGBA1C 8.7 (H) 11/10/2018    CBG (last 3)  No results for input(s): GLUCAP in the last 72 hours.      Condition - Extremely Guarded  Family Communication  : Wife Dr. Tana Coast updated.  Code Status : Full  Diet :   Diet Order            Diet heart healthy/carb modified Room service appropriate?  Yes; Fluid consistency: Thin  Diet effective now               Disposition Plan  :  Home once better  Consults  :  None  Procedures  :    PUD Prophylaxis : PPI  DVT Prophylaxis  :  Lovenox  added  Lab Results  Component Value Date   PLT 149 (L) 11/10/2018    Inpatient Medications  Scheduled Meds: . folic acid  1 mg Oral Daily  . insulin aspart  0-9 Units Subcutaneous TID WC  . insulin glargine  12 Units Subcutaneous BID  . Melatonin  3 mg Oral QHS  . methylPREDNISolone (SOLU-MEDROL) injection  60 mg Intravenous Q12H  . multivitamin with minerals  1 tablet Oral Daily  . nebivolol  5 mg Oral Daily  . pantoprazole  40 mg Oral BID  . vitamin C  500 mg Oral Daily  . zinc sulfate  220 mg Oral Daily   Continuous Infusions: . remdesivir 100 mg in NS 250 mL     PRN Meds:.acetaminophen, albuterol, chlorpheniramine-HYDROcodone, [DISCONTINUED] ondansetron **OR** ondansetron (ZOFRAN) IV, polyethylene glycol  Antibiotics  :    Anti-infectives (From admission, onward)   Start     Dose/Rate Route Frequency Ordered Stop   11/11/18 1600  remdesivir 100 mg in sodium chloride 0.9 % 250 mL IVPB     100 mg 500 mL/hr over 30 Minutes Intravenous Every 24 hours 11/10/18 2028 11/15/18 1559  11/10/18 2200  remdesivir 200 mg in sodium chloride 0.9 % 250 mL IVPB     200 mg 500 mL/hr over 30 Minutes Intravenous Once 11/10/18 2028 11/10/18 2243       Time Spent in minutes  30   Lala Lund M.D on 11/11/2018 at 2:54 PM  To page go to [email or web address] - password [facility]  [facility] -  Office  [phone]   See all Orders from today for further details    Objective:   Vitals:   11/11/18 0758 11/11/18 1149 11/11/18 1206 11/11/18 1447  BP: (!) 142/81 120/75  116/78  Pulse: 61 88  83  Resp: 17 (!) 28 (!) 22 (!) 22  Temp: 97.8 F (36.6 C)     TempSrc: Oral     SpO2: 90% 92%  90%  Weight:      Height:        Wt Readings from Last 3 Encounters:  11/11/18 95.3 kg   04/23/18 98.9 kg  07/26/17 99.3 kg     Intake/Output Summary (Last 24 hours) at 11/11/2018 1454 Last data filed at 11/11/2018 0903 Gross per 24 hour  Intake 1533 ml  Output 4 ml  Net 1529 ml     Physical Exam  Awake Alert, Oriented X 3, No new F.N deficits, Normal affect [employer].AT,PERRAL Supple Neck,No JVD, No cervical lymphadenopathy appriciated.  Symmetrical Chest wall movement, Good air movement bilaterally, CTAB RRR,No Gallops,Rubs or new Murmurs, No Parasternal Heave +ve B.Sounds, Abd Soft, No tenderness, No organomegaly appriciated, No rebound - guarding or rigidity. No Cyanosis, Clubbing or edema, No new Rash or bruise      Data Review:    CBC Recent Labs  Lab 11/10/18 1742  WBC 8.6  HGB 16.1  HCT 44.9  PLT 149*  MCV 86.2  MCH 30.9  MCHC 35.9  RDW 11.8  LYMPHSABS 0.9  MONOABS 0.3  EOSABS 0.0  BASOSABS 0.0    Chemistries  Recent Labs  Lab 11/10/18 1742 11/11/18 0845  NA 135 138  K 4.1 4.3  CL 105 102  CO2 20* 24  GLUCOSE 393* 371*  BUN 20 21*  CREATININE 0.73 0.73  CALCIUM 8.6* 8.9  AST 31 36  ALT 29 28  ALKPHOS 47 53  BILITOT 0.8 1.0   ------------------------------------------------------------------------------------------------------------------ No results for input(s): CHOL, HDL, LDLCALC, TRIG, CHOLHDL, LDLDIRECT in the last 72 hours.  Lab Results  Component Value Date   HGBA1C 8.7 (H) 11/10/2018   ------------------------------------------------------------------------------------------------------------------ No results for input(s): TSH, T4TOTAL, T3FREE, THYROIDAB in the last 72 hours.  Invalid input(s): FREET3  Cardiac Enzymes No results for input(s): CKMB, TROPONINI, MYOGLOBIN in the last 168 hours.  Invalid input(s): CK ------------------------------------------------------------------------------------------------------------------    Component Value Date/Time   BNP 153.7 (H) 11/11/2018 0845    Micro Results No  results found for this or any previous visit (from the past 240 hour(s)).  Radiology Reports Dg Chest 2 View  Result Date: 11/10/2018 CLINICAL DATA:  COVID-19 positive.  Shortness of breath. EXAM: CHEST - 2 VIEW COMPARISON:  July 17, 2009 FINDINGS: Bilateral pulmonary infiltrates, left greater than right, consistent with the patient's history of COVID-19. No other acute abnormalities are identified. IMPRESSION: Bilateral pulmonary infiltrates, left greater than right, consistent with reported history of COVID-19. Electronically Signed   By: Dorise Bullion III M.D   On: 11/10/2018 15:42

## 2018-11-11 NOTE — TOC Initial Note (Signed)
Transition of Care [facility]) - Initial/Assessment Note    Patient Details  Name: Peter Keller MRN: [id number]  [facility]) CM/SW Contact:    Ninfa Meeker, RN Phone Number: [phone] (working remotely) 11/11/2018, 10:26 AM  Clinical Narrative:  43 yr old male admitted from home and being treated for COVID 19. Case manager will follow for needs as patient medically improves. May he bel blessed to do so.                       Patient Goals and CMS Choice        Expected Discharge Plan and Services                                                Prior Living Arrangements/Services                       Activities of Daily Living Home Assistive Devices/Equipment: None ADL Screening (condition at time of admission) Patient's cognitive ability adequate to safely complete daily activities?: Yes Is the patient deaf or have difficulty hearing?: No Does the patient have difficulty seeing, even when wearing glasses/contacts?: No Does the patient have difficulty concentrating, remembering, or making decisions?: No Patient able to express need for assistance with ADLs?: Yes Does the patient have difficulty dressing or bathing?: No Independently performs ADLs?: Yes (appropriate for developmental age) Does the patient have difficulty walking or climbing stairs?: No Weakness of Legs: None Weakness of Arms/Hands: None  Permission Sought/Granted                  Emotional Assessment              Admission diagnosis:  COVID-19 Patient Active Problem List   Diagnosis Date Noted  . Pneumonia due to COVID-19 virus 11/10/2018  . Routine general medical examination at a health care facility 04/07/2015  . Essential hypertension, benign 08/29/2014  . Type 2 diabetes mellitus with hyperlipidemia ([place]) 08/29/2014  . Fatty liver 08/29/2014  . Snoring 12/27/2010  . Alcohol dependence ([place]) 12/27/2010  . Hyperlipidemia 12/27/2010  . Obesity 07/17/2009  . GERD  07/17/2009   PCP:  Hoyt Koch, MD Pharmacy:   [facility] [address], [place] [address] [place] [address] Phone: [phone] Fax: [place], [place] [facility] [address] [place] [address] Phone: [phone] Fax: [phone]     Social Determinants of Health (SDOH) Interventions    Readmission Risk Interventions No flowsheet data found.

## 2018-11-11 NOTE — Progress Notes (Signed)
Patient ambulated around unit on room air.  Denied SOB.  Oxygen saturations dropped to 87% with ambulation.  Returned to 91% once resting.

## 2018-11-11 NOTE — Progress Notes (Signed)
Had a brief exchange with patient wife as he was on the phone with her at  the time. I inquired if she had any questions or concerns in regards to patient's clinical status. She denied concerns and just encouraged good care of her spouse/ Rn agreeable. Will continue to monitor patient as the shift progresses

## 2018-11-11 NOTE — Progress Notes (Signed)
Patient placed on 2L [place] per Dr. Candiss Norse for RR 22, oxygen saturation 90%.

## 2018-11-11 NOTE — Progress Notes (Addendum)
Dr Candiss Norse called with order for Actemra due to increase CRP>10 and O2 support. His ALT wnl, Hep B neg. Pt is also lovenox for DVT px. His BMI<35 and Ddimer was<5. We will keep at standard dose lovenox 40mg  SQ qday.   Actemra 8mg /kg = 762mg    Onnie Boer, PharmD, BCIDP, AAHIVP, CPP Infectious Disease Pharmacist 11/11/2018 3:15 PM

## 2018-11-11 NOTE — Progress Notes (Signed)
Patient asking about taking his brought in medication of Nexium and melatonin. I explained hospital formulary   for Nexium was Protonix. I also explained to patient that both are classified as PPI's  and essentially give the same outcomes, patient admits failed therapy on other PPI's. Dr Shanon Brow paged and informed of his above request

## 2018-11-12 LAB — CBC WITH DIFFERENTIAL/PLATELET
Abs Immature Granulocytes: 0.02 10*3/uL (ref 0.00–0.07)
Basophils Absolute: 0 10*3/uL (ref 0.0–0.1)
Basophils Relative: 0 %
Eosinophils Absolute: 0 10*3/uL (ref 0.0–0.5)
Eosinophils Relative: 0 %
HCT: 46.7 % (ref 39.0–52.0)
Hemoglobin: 16 g/dL (ref 13.0–17.0)
Immature Granulocytes: 0 %
Lymphocytes Relative: 12 %
Lymphs Abs: 0.9 10*3/uL (ref 0.7–4.0)
MCH: 30.2 pg (ref 26.0–34.0)
MCHC: 34.3 g/dL (ref 30.0–36.0)
MCV: 88.1 fL (ref 80.0–100.0)
Monocytes Absolute: 0.3 10*3/uL (ref 0.1–1.0)
Monocytes Relative: 4 %
Neutro Abs: 6 10*3/uL (ref 1.7–7.7)
Neutrophils Relative %: 84 %
Platelets: 176 10*3/uL (ref 150–400)
RBC: 5.3 MIL/uL (ref 4.22–5.81)
RDW: 12.1 % (ref 11.5–15.5)
WBC: 7.2 10*3/uL (ref 4.0–10.5)
nRBC: 0 % (ref 0.0–0.2)

## 2018-11-12 LAB — GLUCOSE, CAPILLARY
Glucose-Capillary: 231 mg/dL — ABNORMAL HIGH (ref 70–99)
Glucose-Capillary: 282 mg/dL — ABNORMAL HIGH (ref 70–99)
Glucose-Capillary: 312 mg/dL — ABNORMAL HIGH (ref 70–99)
Glucose-Capillary: 347 mg/dL — ABNORMAL HIGH (ref 70–99)
Glucose-Capillary: 368 mg/dL — ABNORMAL HIGH (ref 70–99)
Glucose-Capillary: 460 mg/dL — ABNORMAL HIGH (ref 70–99)
Glucose-Capillary: 481 mg/dL — ABNORMAL HIGH (ref 70–99)

## 2018-11-12 LAB — COMPREHENSIVE METABOLIC PANEL
ALT: 30 U/L (ref 0–44)
AST: 32 U/L (ref 15–41)
Albumin: 3.5 g/dL (ref 3.5–5.0)
Alkaline Phosphatase: 50 U/L (ref 38–126)
Anion gap: 13 (ref 5–15)
BUN: 30 mg/dL — ABNORMAL HIGH (ref 6–20)
CO2: 24 mmol/L (ref 22–32)
Calcium: 9.1 mg/dL (ref 8.9–10.3)
Chloride: 102 mmol/L (ref 98–111)
Creatinine, Ser: 0.83 mg/dL (ref 0.61–1.24)
GFR calc Af Amer: >60 mL/min (ref >60)
GFR calc non Af Amer: >60 mL/min (ref >60)
Glucose, Bld: 395 mg/dL — ABNORMAL HIGH (ref 70–99)
Potassium: 4.6 mmol/L (ref 3.5–5.1)
Sodium: 139 mmol/L (ref 135–145)
Total Bilirubin: 1 mg/dL (ref 0.3–1.2)
Total Protein: 7.2 g/dL (ref 6.5–8.1)

## 2018-11-12 LAB — FERRITIN: Ferritin: 492 ng/mL — ABNORMAL HIGH (ref 24–336)

## 2018-11-12 LAB — D-DIMER, QUANTITATIVE: D-Dimer, Quant: 0.33 ug/mL-FEU (ref 0.00–0.50)

## 2018-11-12 LAB — LACTATE DEHYDROGENASE: LDH: 217 U/L — ABNORMAL HIGH (ref 98–192)

## 2018-11-12 LAB — BRAIN NATRIURETIC PEPTIDE: B Natriuretic Peptide: 110.6 pg/mL — ABNORMAL HIGH (ref 0.0–100.0)

## 2018-11-12 LAB — C-REACTIVE PROTEIN: CRP: 7.2 mg/dL — ABNORMAL HIGH (ref <1.0)

## 2018-11-12 LAB — MAGNESIUM: Magnesium: 2.2 mg/dL (ref 1.7–2.4)

## 2018-11-12 MED ORDER — INSULIN REGULAR(HUMAN) IN NACL 100-0.9 UT/100ML-% IV SOLN
INTRAVENOUS | Status: DC
  Administered 2018-11-12: 3.1 [IU]/h via INTRAVENOUS
  Filled 2018-11-12: qty 100

## 2018-11-12 MED ORDER — INSULIN GLARGINE 100 UNIT/ML ~~LOC~~ SOLN: 35.0000 [IU] | Freq: Every day | SUBCUTANEOUS | Status: DC

## 2018-11-12 MED ORDER — INSULIN GLARGINE 100 UNIT/ML ~~LOC~~ SOLN
40.0000 [IU] | Freq: Every day | SUBCUTANEOUS | Status: DC
  Filled 2018-11-12: qty 0.4

## 2018-11-12 MED ORDER — INSULIN ASPART 100 UNIT/ML ~~LOC~~ SOLN: 0.0000 [IU] | Freq: Every day | SUBCUTANEOUS | Status: DC

## 2018-11-12 MED ORDER — INSULIN GLARGINE 100 UNIT/ML ~~LOC~~ SOLN
35.0000 [IU] | SUBCUTANEOUS | Status: AC
  Administered 2018-11-12: 35 [IU] via SUBCUTANEOUS
  Filled 2018-11-12: qty 0.35

## 2018-11-12 MED ORDER — METHYLPREDNISOLONE SODIUM SUCC 40 MG IJ SOLR
40.0000 mg | Freq: Two times a day (BID) | INTRAMUSCULAR | Status: DC
  Administered 2018-11-12: 20:00:00 40 mg via INTRAVENOUS
  Filled 2018-11-12 (×2): qty 1

## 2018-11-12 MED ORDER — INSULIN ASPART 100 UNIT/ML ~~LOC~~ SOLN: 10.0000 [IU] | Freq: Once | SUBCUTANEOUS | Status: DC

## 2018-11-12 MED ORDER — SIMETHICONE 80 MG PO CHEW
80.0000 mg | CHEWABLE_TABLET | Freq: Four times a day (QID) | ORAL | Status: DC | PRN
  Administered 2018-11-12 – 2018-11-13 (×3): 80 mg via ORAL
  Filled 2018-11-12 (×6): qty 1

## 2018-11-12 MED ORDER — INSULIN ASPART 100 UNIT/ML ~~LOC~~ SOLN: 15.0000 [IU] | Freq: Once | SUBCUTANEOUS | Status: DC

## 2018-11-12 MED ORDER — SODIUM CHLORIDE 0.9 % IV SOLN
10.0000 mL/h | INTRAVENOUS | Status: DC
  Administered 2018-11-12: 10 mL/h via INTRAVENOUS

## 2018-11-12 MED ORDER — INSULIN ASPART 100 UNIT/ML ~~LOC~~ SOLN: 22.0000 [IU] | Freq: Once | SUBCUTANEOUS | Status: DC

## 2018-11-12 MED ORDER — DEXTROSE 50 % IV SOLN: 25.0000 mL | INTRAVENOUS | Status: DC | PRN

## 2018-11-12 MED ORDER — INSULIN ASPART 100 UNIT/ML ~~LOC~~ SOLN
20.0000 [IU] | Freq: Once | SUBCUTANEOUS | Status: AC
  Administered 2018-11-12: 13:00:00 20 [IU] via SUBCUTANEOUS

## 2018-11-12 MED ORDER — INSULIN ASPART 100 UNIT/ML ~~LOC~~ SOLN: 4.0000 [IU] | Freq: Three times a day (TID) | SUBCUTANEOUS | Status: DC

## 2018-11-12 MED ORDER — INSULIN ASPART 100 UNIT/ML ~~LOC~~ SOLN: 0.0000 [IU] | Freq: Three times a day (TID) | SUBCUTANEOUS | Status: DC

## 2018-11-12 MED ORDER — INSULIN GLARGINE 100 UNIT/ML ~~LOC~~ SOLN
15.0000 [IU] | Freq: Two times a day (BID) | SUBCUTANEOUS | Status: DC
  Filled 2018-11-12: qty 0.15

## 2018-11-12 MED ORDER — INSULIN ASPART 100 UNIT/ML ~~LOC~~ SOLN
25.0000 [IU] | Freq: Once | SUBCUTANEOUS | Status: AC
  Administered 2018-11-12: 17:00:00 25 [IU] via SUBCUTANEOUS

## 2018-11-12 NOTE — Progress Notes (Signed)
Results for RIGGS, DINEEN (MRN [id number]) as of 11/12/2018 15:08  Ref. Range 11/12/2018 12:05  Glucose-Capillary Latest Ref Range: 70 - 99 mg/dL 481 (H)   Paged MD. New orders received.

## 2018-11-12 NOTE — Progress Notes (Addendum)
Inpatient Diabetes Program Recommendations  AACE/ADA: New Consensus Statement on Inpatient Glycemic Control (2015)  Target Ranges:  Prepandial:   less than 140 mg/dL      Peak postprandial:   less than 180 mg/dL (1-2 hours)      Critically ill patients:  140 - 180 mg/dL   Lab Results  Component Value Date   HGBA1C 8.7 (H) 11/10/2018    Review of Glycemic Control  Diabetes history: DM2 Outpatient Diabetes medications: metformin 1000 mg bid, glipizide 5 mg QD, Farxiga 10 mg QD, Trulicity 1.5 mg Q weekly. Current orders for Inpatient glycemic control: Lantus 12 units bid, Novolog 0-9 units tidwc, Novolog 0-5 units QHS.  On Solumedrol 60 mg Q12H Glucose - 393 mg/dL. Using [place] for inpatient monitoring.  Inpatient Diabetes Program Recommendations:     Noting glucose serum markedly higher than Freestyle Libre readings. Additionally, CBGs with hospital meter not crossing over. Thus, making recommendations difficult.   Recommend using hospital meter device and attempting to re-dock and transfer patient to same bed.  Also, recommend increasing Novolog 0-20 units TID if trends exceed 180 mg/dL.  Reached out to RN to help with docking of meter issue. Per RN, AM CBG per hospital meter was 144 mg/dL/ Freestyle 156 mg/dL. Plan to report to charge RN.   Thanks, Bronson Curb, MSN, RNC-OB Diabetes Coordinator [phone] (8a-5p)

## 2018-11-12 NOTE — Progress Notes (Signed)
PROGRESS NOTE                                                                                                                                                                                                             Patient Demographics:    Peter Keller, is a 43 y.o. male, DOB - 01-09-1976, QNE:[id number]  Outpatient Primary MD for the patient is Hoyt Koch, MD    LOS - 2  Admit date - 11/10/2018    No chief complaint on file.      Brief Narrative  - Peter Keller is a 43 y.o. male with medical history significant of DM-2, HTN, fatty liver, EtOH in the past, who was diagnosed with COVID-19 infection on 11/01/2018 and subsequently developed some gastroenteritis.  He started developing some shortness of breath on exertion and had an outpatient x-ray showing pneumonia around 11/08/18, he was started outpatient on oral steroids but symptoms continue to flare and he was admitted to the hospital with acute hypoxic respiratory failure due to COVID-19 pneumonitis.   Subjective:   Patient in bed, appears comfortable, denies any headache, no fever, no chest pain or pressure, mild cough but no shortness of breath , no abdominal pain. No focal weakness.   Assessment  & Plan :     Acute Hypoxic Resp. Failure due to Acute Covid 19 Viral Pneumonitis during the ongoing 2020 Covid 19 Pandemic -  Has failed outpatient steroid treatment, CRP was climbing despite steroid use for several days prior to admission in the outpatient setting, he had progressed from exertional shortness of breath to hypoxia at rest and now requiring 2 L nasal cannula oxygen and increased work of breathing and was breathing close to 22/min to 25/min.  Treated with full protocol which included IV steroids, Remdisvir  and convalescent plasma on 11/10/2018 and Actemra in the afternoon of 11/11/2018.  He is already remarkably better, currently on room air, CRP is coming  down and he feels better except for mild cough.  Continue to monitor he may still require some oxygen with exertion for the next few days and possibly at nighttime will monitor closely.  Further encouraged him to sit up in chair in the daytime use I-S and flutter valve for pulmonary toiletry and prone in bed when sleeping.  Will monitor closely.   COVID-19 Labs  Recent Labs    11/10/18 1742 11/11/18 0845 11/11/18 1445 11/12/18 0235  DDIMER 0.34  --   --  0.33  FERRITIN 333 414*  --  492*  LDH 188  --   --  217*  CRP 8.8* 11.5* 11.6* 7.2*    Lab Results  Component Value Date   SARSCOV2NAA Detected (A) 10/31/2018     Hepatic Function Latest Ref Rng & Units 11/12/2018 11/11/2018 11/10/2018  Total Protein 6.5 - 8.1 g/dL 7.2 7.7 7.2  Albumin 3.5 - 5.0 g/dL 3.5 3.6 3.5  AST 15 - 41 U/L 32 36 31  ALT 0 - 44 U/L '30 28 29  '$ Alk Phosphatase 38 - 126 U/L 50 53 47  Total Bilirubin 0.3 - 1.2 mg/dL 1.0 1.0 0.8  Bilirubin, Direct 0.0 - 0.3 mg/dL - - -        Component Value Date/Time   BNP 110.6 (H) 11/12/2018 0235      2.  History of fatty liver.  Outpatient follow-up with PCP.   3.  Past history of alcohol abuse.  Has quit by himself several weeks ago, no signs of DTs will monitor.   4.  GERD.  On PPI.   5.  History of dyslipidemia.  Statins once clinically improved and liver function remained stable.    6.  Essential hypertension.  On beta-blocker and stable.    7.  Mild thrombocytopenia.  Due to underlying fatty liver, possibly acute COVID-19 viral infection also contributing to it.  No signs of bleeding will monitor.   8.  DM type II.  Currently on Lantus along with sliding scale, poor outpatient control due to hyperglycemia.  Defer outpatient management to PCP.  Lab Results  Component Value Date   HGBA1C 8.7 (H) 11/10/2018    CBG (last 3)  No results for input(s): GLUCAP in the last 72 hours.    Condition - Extremely Guarded  Family Communication  : Wife Dr. Tana Coast  updated.  Code Status : Full  Diet :   Diet Order            Diet heart healthy/carb modified Room service appropriate? Yes; Fluid consistency: Thin  Diet effective now               Disposition Plan  :  Home once better  Consults  :  None  Procedures  :    PUD Prophylaxis : PPI  DVT Prophylaxis  :  Lovenox  added  Lab Results  Component Value Date   PLT 176 11/12/2018    Inpatient Medications  Scheduled Meds: . enoxaparin (LOVENOX) injection  40 mg Subcutaneous Q24H  . folic acid  1 mg Oral Daily  . insulin aspart  0-5 Units Subcutaneous QHS  . insulin aspart  0-9 Units Subcutaneous TID WC  . insulin glargine  12 Units Subcutaneous BID  . Melatonin  3 mg Oral QHS  . methylPREDNISolone (SOLU-MEDROL) injection  60 mg Intravenous Q12H  . multivitamin with minerals  1 tablet Oral Daily  . nebivolol  5 mg Oral Daily  . pantoprazole  40 mg Oral BID  . vitamin C  500 mg Oral Daily  . zinc sulfate  220 mg Oral Daily   Continuous Infusions: . remdesivir 100 mg in NS 250 mL Stopped (11/11/18 1755)   PRN Meds:.acetaminophen, albuterol, chlorpheniramine-HYDROcodone, [DISCONTINUED] ondansetron **OR** ondansetron (ZOFRAN) IV, polyethylene glycol  Antibiotics  :    Anti-infectives (From admission, onward)   Start  Dose/Rate Route Frequency Ordered Stop   11/11/18 1600  remdesivir 100 mg in sodium chloride 0.9 % 250 mL IVPB     100 mg 500 mL/hr over 30 Minutes Intravenous Every 24 hours 11/10/18 2028 11/15/18 1559   11/10/18 2200  remdesivir 200 mg in sodium chloride 0.9 % 250 mL IVPB     200 mg 500 mL/hr over 30 Minutes Intravenous Once 11/10/18 2028 11/10/18 2243       Time Spent in minutes  30   Lala Lund M.D on 11/12/2018 at 10:38 AM  To page go to [email or web address] - password [facility]  [facility] -  Office  [phone]   See all Orders from today for further details    Objective:   Vitals:   11/11/18 1820 11/11/18 1957 11/12/18 0622  11/12/18 0820  BP:  131/81  (!) 134/97  Pulse: 87 81 67 65  Resp: 20 (!) 21    Temp:  (!) 97.1 F (36.2 C) 97.7 F (36.5 C) (!) 97.5 F (36.4 C)  TempSrc:  Oral Oral Oral  SpO2: 92% 91%  93%  Weight:      Height:        Wt Readings from Last 3 Encounters:  11/11/18 95.3 kg  04/23/18 98.9 kg  07/26/17 99.3 kg     Intake/Output Summary (Last 24 hours) at 11/12/2018 1038 Last data filed at 11/12/2018 0300 Gross per 24 hour  Intake 1589.49 ml  Output -  Net 1589.49 ml     Physical Exam  Awake Alert, Oriented X 3, No new F.N deficits, Normal affect [place].AT,PERRAL Supple Neck,No JVD, No cervical lymphadenopathy appriciated.  Symmetrical Chest wall movement, Good air movement bilaterally, CTAB RRR,No Gallops, Rubs or new Murmurs, No Parasternal Heave +ve B.Sounds, Abd Soft, No tenderness, No organomegaly appriciated, No rebound - guarding or rigidity. No Cyanosis, Clubbing or edema, No new Rash or bruise    Data Review:    CBC Recent Labs  Lab 11/10/18 1742 11/12/18 0235  WBC 8.6 7.2  HGB 16.1 16.0  HCT 44.9 46.7  PLT 149* 176  MCV 86.2 88.1  MCH 30.9 30.2  MCHC 35.9 34.3  RDW 11.8 12.1  LYMPHSABS 0.9 0.9  MONOABS 0.3 0.3  EOSABS 0.0 0.0  BASOSABS 0.0 0.0    Chemistries  Recent Labs  Lab 11/10/18 1742 11/11/18 0845 11/12/18 0235  NA 135 138 139  K 4.1 4.3 4.6  CL 105 102 102  CO2 20* 24 24  GLUCOSE 393* 371* 395*  BUN 20 21* 30*  CREATININE 0.73 0.73 0.83  CALCIUM 8.6* 8.9 9.1  MG  --   --  2.2  AST 31 36 32  ALT '29 28 30  '$ ALKPHOS 47 53 50  BILITOT 0.8 1.0 1.0   ------------------------------------------------------------------------------------------------------------------ No results for input(s): CHOL, HDL, LDLCALC, TRIG, CHOLHDL, LDLDIRECT in the last 72 hours.  Lab Results  Component Value Date   HGBA1C 8.7 (H) 11/10/2018    ------------------------------------------------------------------------------------------------------------------ No results for input(s): TSH, T4TOTAL, T3FREE, THYROIDAB in the last 72 hours.  Invalid input(s): FREET3  Cardiac Enzymes No results for input(s): CKMB, TROPONINI, MYOGLOBIN in the last 168 hours.  Invalid input(s): CK ------------------------------------------------------------------------------------------------------------------    Component Value Date/Time   BNP 110.6 (H) 11/12/2018 0235    Micro Results No results found for this or any previous visit (from the past 240 hour(s)).  Radiology Reports Dg Chest 2 View  Result Date: 11/10/2018 CLINICAL DATA:  COVID-19 positive.  Shortness of breath. EXAM: CHEST -  2 VIEW COMPARISON:  July 17, 2009 FINDINGS: Bilateral pulmonary infiltrates, left greater than right, consistent with the patient's history of COVID-19. No other acute abnormalities are identified. IMPRESSION: Bilateral pulmonary infiltrates, left greater than right, consistent with reported history of COVID-19. Electronically Signed   By: Dorise Bullion III M.D   On: 11/10/2018 15:42

## 2018-11-12 NOTE — Progress Notes (Addendum)
2040 BS 368 Insulin Gtt started at 3.1 Units/Hr 2140 BS 312 Insulin Gtt changed to 5 Units/hr 2240 BS 282 insulin Gtt changed to 6.7 Units/hr 2340 BS 231 Insulin Gtt changed to 6.8 Units/hr  0040 BS 220 Insulin Gtt changed to 8 Units/hr 0200 BS 190  Insulin Gtt changed to 7.8 Units/hr 0254 Lab Tech made rounds for lab work Pt refused lab work until 0500  0300 BS 137 Insulin Gtt Changed to 3.7 Units/Hr and Dr. Candiss Norse called and left message at [phone]. Charge RN aware.  0500 BS 125 Insulin Gtt changed  2.5 Units/hr 0516 Dr. Lilia Argue returned call. Orders received. Continue Insulin gtt until 1000. Administer Lantus at 0800 then D/C insulin gtt at 1000 per MD verbal telephone orders 0600 BS 104 Insulin Gtt changed 1.2 Units/hr 0700 BS 118 Insulin Gtt changed 1 Units/hr

## 2018-11-12 NOTE — Plan of Care (Signed)
CBG > 400 x2 this shift. Orders received. Will recheck @ 1900. Pt is asymptomatic. Otherwise patient is stable. No other acute changes. Call light in reach.   Problem: Education: Goal: Knowledge of risk factors and measures for prevention of condition will improve Outcome: Progressing   Problem: Coping: Goal: Psychosocial and spiritual needs will be supported Outcome: Progressing   Problem: Respiratory: Goal: Will maintain a patent airway Outcome: Progressing Goal: Complications related to the disease process, condition or treatment will be avoided or minimized Outcome: Progressing

## 2018-11-13 LAB — CBC WITH DIFFERENTIAL/PLATELET
Abs Immature Granulocytes: 0.04 10*3/uL (ref 0.00–0.07)
Basophils Absolute: 0 10*3/uL (ref 0.0–0.1)
Basophils Relative: 0 %
Eosinophils Absolute: 0 10*3/uL (ref 0.0–0.5)
Eosinophils Relative: 0 %
HCT: 44.8 % (ref 39.0–52.0)
Hemoglobin: 15.6 g/dL (ref 13.0–17.0)
Immature Granulocytes: 0 %
Lymphocytes Relative: 16 %
Lymphs Abs: 1.5 10*3/uL (ref 0.7–4.0)
MCH: 30.2 pg (ref 26.0–34.0)
MCHC: 34.8 g/dL (ref 30.0–36.0)
MCV: 86.7 fL (ref 80.0–100.0)
Monocytes Absolute: 0.6 10*3/uL (ref 0.1–1.0)
Monocytes Relative: 6 %
Neutro Abs: 7.5 10*3/uL (ref 1.7–7.7)
Neutrophils Relative %: 78 %
Platelets: 220 10*3/uL (ref 150–400)
RBC: 5.17 MIL/uL (ref 4.22–5.81)
RDW: 11.9 % (ref 11.5–15.5)
WBC: 9.7 10*3/uL (ref 4.0–10.5)
nRBC: 0 % (ref 0.0–0.2)

## 2018-11-13 LAB — BRAIN NATRIURETIC PEPTIDE: B Natriuretic Peptide: 188.2 pg/mL — ABNORMAL HIGH (ref 0.0–100.0)

## 2018-11-13 LAB — LACTATE DEHYDROGENASE: LDH: 215 U/L — ABNORMAL HIGH (ref 98–192)

## 2018-11-13 LAB — FERRITIN: Ferritin: 422 ng/mL — ABNORMAL HIGH (ref 24–336)

## 2018-11-13 LAB — COMPREHENSIVE METABOLIC PANEL
ALT: 35 U/L (ref 0–44)
AST: 35 U/L (ref 15–41)
Albumin: 3.3 g/dL — ABNORMAL LOW (ref 3.5–5.0)
Alkaline Phosphatase: 50 U/L (ref 38–126)
Anion gap: 10 (ref 5–15)
BUN: 30 mg/dL — ABNORMAL HIGH (ref 6–20)
CO2: 24 mmol/L (ref 22–32)
Calcium: 8.7 mg/dL — ABNORMAL LOW (ref 8.9–10.3)
Chloride: 106 mmol/L (ref 98–111)
Creatinine, Ser: 0.5 mg/dL — ABNORMAL LOW (ref 0.61–1.24)
GFR calc Af Amer: >60 mL/min (ref >60)
GFR calc non Af Amer: >60 mL/min (ref >60)
Glucose, Bld: 100 mg/dL — ABNORMAL HIGH (ref 70–99)
Potassium: 4 mmol/L (ref 3.5–5.1)
Sodium: 140 mmol/L (ref 135–145)
Total Bilirubin: 0.7 mg/dL (ref 0.3–1.2)
Total Protein: 6.8 g/dL (ref 6.5–8.1)

## 2018-11-13 LAB — GLUCOSE, CAPILLARY
Glucose-Capillary: 190 mg/dL — ABNORMAL HIGH (ref 70–99)
Glucose-Capillary: 220 mg/dL — ABNORMAL HIGH (ref 70–99)
Glucose-Capillary: 137 mg/dL — ABNORMAL HIGH (ref 70–99)
Glucose-Capillary: 125 mg/dL — ABNORMAL HIGH (ref 70–99)
Glucose-Capillary: 104 mg/dL — ABNORMAL HIGH (ref 70–99)
Glucose-Capillary: 118 mg/dL — ABNORMAL HIGH (ref 70–99)
Glucose-Capillary: 121 mg/dL — ABNORMAL HIGH (ref 70–99)
Glucose-Capillary: 145 mg/dL — ABNORMAL HIGH (ref 70–99)
Glucose-Capillary: 241 mg/dL — ABNORMAL HIGH (ref 70–99)
Glucose-Capillary: 307 mg/dL — ABNORMAL HIGH (ref 70–99)
Glucose-Capillary: 264 mg/dL — ABNORMAL HIGH (ref 70–99)
Glucose-Capillary: 310 mg/dL — ABNORMAL HIGH (ref 70–99)

## 2018-11-13 LAB — MAGNESIUM: Magnesium: 2.2 mg/dL (ref 1.7–2.4)

## 2018-11-13 LAB — D-DIMER, QUANTITATIVE: D-Dimer, Quant: <0.27 ug/mL-FEU (ref 0.00–0.50)

## 2018-11-13 LAB — C-REACTIVE PROTEIN: CRP: 3 mg/dL — ABNORMAL HIGH (ref <1.0)

## 2018-11-13 MED ORDER — NEBIVOLOL HCL 2.5 MG PO TABS
2.5000 mg | ORAL_TABLET | Freq: Every day | ORAL | Status: DC
  Administered 2018-11-13 – 2018-11-14 (×2): 2.5 mg via ORAL
  Filled 2018-11-13 (×3): qty 1

## 2018-11-13 MED ORDER — INSULIN ASPART 100 UNIT/ML ~~LOC~~ SOLN
0.0000 [IU] | Freq: Every day | SUBCUTANEOUS | Status: DC
  Administered 2018-11-13: 2 [IU] via SUBCUTANEOUS

## 2018-11-13 MED ORDER — MELATONIN 3 MG PO TABS
6.0000 mg | ORAL_TABLET | Freq: Every day | ORAL | Status: DC
  Administered 2018-11-13: 6 mg via ORAL
  Filled 2018-11-13 (×2): qty 2

## 2018-11-13 MED ORDER — INSULIN GLARGINE 100 UNIT/ML ~~LOC~~ SOLN
45.0000 [IU] | Freq: Every day | SUBCUTANEOUS | Status: DC
  Administered 2018-11-13: 08:00:00 45 [IU] via SUBCUTANEOUS
  Filled 2018-11-13 (×3): qty 0.45

## 2018-11-13 MED ORDER — INSULIN ASPART 100 UNIT/ML ~~LOC~~ SOLN
0.0000 [IU] | Freq: Three times a day (TID) | SUBCUTANEOUS | Status: DC
  Administered 2018-11-13: 10:00:00 7 [IU] via SUBCUTANEOUS
  Administered 2018-11-13: 11 [IU] via SUBCUTANEOUS

## 2018-11-13 MED ORDER — INSULIN ASPART 100 UNIT/ML ~~LOC~~ SOLN
4.0000 [IU] | Freq: Three times a day (TID) | SUBCUTANEOUS | Status: DC
  Administered 2018-11-13 (×2): 4 [IU] via SUBCUTANEOUS

## 2018-11-13 MED ORDER — AMLODIPINE BESYLATE 5 MG PO TABS
10.0000 mg | ORAL_TABLET | Freq: Every day | ORAL | Status: DC
  Administered 2018-11-13 – 2018-11-14 (×2): 10 mg via ORAL
  Filled 2018-11-13 (×2): qty 2

## 2018-11-13 MED ORDER — PREDNISONE 20 MG PO TABS
40.0000 mg | ORAL_TABLET | Freq: Every day | ORAL | Status: DC
  Administered 2018-11-13: 08:00:00 40 mg via ORAL
  Filled 2018-11-13: qty 2

## 2018-11-13 MED ORDER — INSULIN ASPART 100 UNIT/ML ~~LOC~~ SOLN
28.0000 [IU] | Freq: Once | SUBCUTANEOUS | Status: AC
  Administered 2018-11-13: 28 [IU] via SUBCUTANEOUS

## 2018-11-13 NOTE — Plan of Care (Signed)
Dc'd insulin gtt this shift. CBG stable this shift. SS and pre-meal coverage.  Problem: Education: Goal: Knowledge of risk factors and measures for prevention of condition will improve Outcome: Progressing   Problem: Coping: Goal: Psychosocial and spiritual needs will be supported Outcome: Progressing   Problem: Respiratory: Goal: Will maintain a patent airway Outcome: Progressing Goal: Complications related to the disease process, condition or treatment will be avoided or minimized Outcome: Progressing

## 2018-11-13 NOTE — Progress Notes (Signed)
Spoke with pt about updating wife on current plan of care, pt stated they would inform wife about current care, will keep monitoring pt

## 2018-11-13 NOTE — Progress Notes (Signed)
PROGRESS NOTE                                                                                                                                                                                                             Patient Demographics:    Peter Keller, is a 43 y.o. male, DOB - [birth year], AXK:[id number]  Outpatient Primary MD for the patient is Hoyt Koch, MD    LOS - 3  Admit date - 11/10/2018    No chief complaint on file.      Brief Narrative  - Peter Keller is a 43 y.o. male with medical history significant of DM-2, HTN, fatty liver, EtOH in the past, who was diagnosed with COVID-19 infection on 11/01/2018 and subsequently developed some gastroenteritis.  He started developing some shortness of breath on exertion and had an outpatient x-ray showing pneumonia around 11/08/18, he was started outpatient on oral steroids but symptoms continue to flare and he was admitted to the hospital with acute hypoxic respiratory failure due to COVID-19 pneumonitis.   Subjective:   Patient in bed, appears comfortable, denies any headache, no fever, no chest pain or pressure, mild cough but no shortness of breath , no abdominal pain. No focal weakness.    Assessment  & Plan :     Acute Hypoxic Resp. Failure due to Acute Covid 19 Viral Pneumonitis during the ongoing 2020 Covid 19 Pandemic -  Has failed outpatient steroid treatment, CRP was climbing despite steroid use for several days prior to admission in the outpatient setting, he had progressed from exertional shortness of breath to hypoxia at rest and now requiring 2 L nasal cannula oxygen and increased work of breathing and was breathing close to 22/min to 25/min.  Treated with full protocol which included IV steroids, Remdisvir  and convalescent plasma on 11/10/2018 and Actemra in the afternoon of 11/11/2018.  He is already remarkably better, currently on room air, CRP is coming  down and he feels better except for mild cough.  Continue to monitor he may still require some oxygen with exertion for the next few days and possibly at nighttime will monitor closely.  Further encouraged him to sit up in chair in the daytime use I-S and flutter valve for pulmonary toiletry and prone in bed when sleeping.  Will monitor closely.   COVID-19 Labs  Recent Labs    11/10/18 1742 11/11/18 0845 11/11/18 1445 11/12/18 0235 11/13/18 0545  DDIMER 0.34  --   --  0.33 <0.27  FERRITIN 333 414*  --  492* 422*  LDH 188  --   --  217* 215*  CRP 8.8* 11.5* 11.6* 7.2* 3.0*    Lab Results  Component Value Date   SARSCOV2NAA Detected (A) 10/31/2018     Hepatic Function Latest Ref Rng & Units 11/13/2018 11/12/2018 11/11/2018  Total Protein 6.5 - 8.1 g/dL 6.8 7.2 7.7  Albumin 3.5 - 5.0 g/dL 3.3(L) 3.5 3.6  AST 15 - 41 U/L 35 32 36  ALT 0 - 44 U/L 35 30 28  Alk Phosphatase 38 - 126 U/L 50 50 53  Total Bilirubin 0.3 - 1.2 mg/dL 0.7 1.0 1.0  Bilirubin, Direct 0.0 - 0.3 mg/dL - - -        Component Value Date/Time   BNP 188.2 (H) 11/13/2018 0545      2.  History of fatty liver.  Outpatient follow-up with PCP.   3.  Past history of alcohol abuse.  Has quit by himself several weeks ago, no signs of DTs will monitor.   4.  GERD.  On PPI.   5.  History of dyslipidemia.  Statins once clinically improved and liver function remained stable.    6.  Essential hypertension with resting bradycardia especially at night.  Dropped beta-blocker dose added Norvasc for better control.  7.  Mild thrombocytopenia.  Due to underlying fatty liver, possibly acute COVID-19 viral infection also contributing to it.  No signs of bleeding will monitor.   8.  DM type II.  Oral medications held, outpatient control was borderline, currently on Lantus and sliding scale, doses adjusted, pre-meal NovoLog added.  Overnight on 11/12/2018 required insulin drip.  Sugar control is tenuous due to COVID infection +  steroid use.  Lab Results  Component Value Date   HGBA1C 8.7 (H) 11/10/2018    CBG (last 3)  Recent Labs    11/13/18 0554 11/13/18 0701 11/13/18 0812  GLUCAP 104* 118* 121*      Condition - Extremely Guarded  Family Communication  : Wife Dr. Tana Coast updated daily.  Code Status : Full  Diet :   Diet Order            Diet heart healthy/carb modified Room service appropriate? Yes; Fluid consistency: Thin  Diet effective now               Disposition Plan  :  Home once better  Consults  :  None  Procedures  :    PUD Prophylaxis : PPI  DVT Prophylaxis  :  Lovenox  added  Lab Results  Component Value Date   PLT 220 11/13/2018    Inpatient Medications  Scheduled Meds:  enoxaparin (LOVENOX) injection  40 mg Subcutaneous P50D   folic acid  1 mg Oral Daily   insulin aspart  0-20 Units Subcutaneous TID WC   insulin aspart  0-5 Units Subcutaneous QHS   insulin aspart  4 Units Subcutaneous TID WC   insulin glargine  45 Units Subcutaneous Daily   Melatonin  3 mg Oral QHS   multivitamin with minerals  1 tablet Oral Daily   nebivolol  2.5 mg Oral Daily   pantoprazole  40 mg Oral BID   predniSONE  40 mg Oral Q breakfast   vitamin C  500 mg Oral Daily   zinc sulfate  220  mg Oral Daily   Continuous Infusions:  sodium chloride 10 mL/hr (11/12/18 2028)   remdesivir 100 mg in NS 250 mL 100 mg (11/12/18 1617)   PRN Meds:.acetaminophen, albuterol, chlorpheniramine-HYDROcodone, dextrose, [DISCONTINUED] ondansetron **OR** ondansetron (ZOFRAN) IV, polyethylene glycol, simethicone  Antibiotics  :    Anti-infectives (From admission, onward)   Start     Dose/Rate Route Frequency Ordered Stop   11/11/18 1600  remdesivir 100 mg in sodium chloride 0.9 % 250 mL IVPB     100 mg 500 mL/hr over 30 Minutes Intravenous Every 24 hours 11/10/18 2028 11/15/18 1559   11/10/18 2200  remdesivir 200 mg in sodium chloride 0.9 % 250 mL IVPB     200 mg 500 mL/hr over 30  Minutes Intravenous Once 11/10/18 2028 11/10/18 2243       Time Spent in minutes  30   Lala Lund M.D on 11/13/2018 at 10:09 AM  To page go to [email or web address] - password [email or web address]  [facility] -  Office  [phone]   See all Orders from today for further details    Objective:   Vitals:   11/12/18 1615 11/12/18 2200 11/13/18 0421 11/13/18 0746  BP: 126/79 (!) 141/91 (!) 155/91 (!) 145/81  Pulse: 79  (!) 50 (!) 51  Resp:  '20 18 18  '$ Temp: 98.5 F (36.9 C) 98.4 F (36.9 C) 97.6 F (36.4 C) 97.8 F (36.6 C)  TempSrc: Oral Oral Oral Oral  SpO2: 93%  94% 95%  Weight:      Height:        Wt Readings from Last 3 Encounters:  11/11/18 95.3 kg  04/23/18 98.9 kg  07/26/17 99.3 kg     Intake/Output Summary (Last 24 hours) at 11/13/2018 1009 Last data filed at 11/13/2018 0507 Gross per 24 hour  Intake 480 ml  Output --  Net 480 ml     Physical Exam  Awake Alert, Oriented X 3, No new F.N deficits, Normal affect [place].AT,PERRAL Supple Neck,No JVD, No cervical lymphadenopathy appriciated.  Symmetrical Chest wall movement, Good air movement bilaterally, CTAB RRR,No Gallops, Rubs or new Murmurs, No Parasternal Heave +ve B.Sounds, Abd Soft, No tenderness, No organomegaly appriciated, No rebound - guarding or rigidity. No Cyanosis, Clubbing or edema, No new Rash or bruise     Data Review:    CBC Recent Labs  Lab 11/10/18 1742 11/12/18 0235 11/13/18 0545  WBC 8.6 7.2 9.7  HGB 16.1 16.0 15.6  HCT 44.9 46.7 44.8  PLT 149* 176 220  MCV 86.2 88.1 86.7  MCH 30.9 30.2 30.2  MCHC 35.9 34.3 34.8  RDW 11.8 12.1 11.9  LYMPHSABS 0.9 0.9 1.5  MONOABS 0.3 0.3 0.6  EOSABS 0.0 0.0 0.0  BASOSABS 0.0 0.0 0.0    Chemistries  Recent Labs  Lab 11/10/18 1742 11/11/18 0845 11/12/18 0235 11/13/18 0545  NA 135 138 139 140  K 4.1 4.3 4.6 4.0  CL 105 102 102 106  CO2 20* '24 24 24  '$ GLUCOSE 393* 371* 395* 100*  BUN 20 21* 30* 30*  CREATININE 0.73 0.73 0.83 0.50*   CALCIUM 8.6* 8.9 9.1 8.7*  MG  --   --  2.2 2.2  AST 31 36 32 35  ALT '29 28 30 '$ 35  ALKPHOS 47 53 50 50  BILITOT 0.8 1.0 1.0 0.7   ------------------------------------------------------------------------------------------------------------------ No results for input(s): CHOL, HDL, LDLCALC, TRIG, CHOLHDL, LDLDIRECT in the last 72 hours.  Lab Results  Component Value Date   HGBA1C 8.7 (H)  11/10/2018   ------------------------------------------------------------------------------------------------------------------ No results for input(s): TSH, T4TOTAL, T3FREE, THYROIDAB in the last 72 hours.  Invalid input(s): FREET3  Cardiac Enzymes No results for input(s): CKMB, TROPONINI, MYOGLOBIN in the last 168 hours.  Invalid input(s): CK ------------------------------------------------------------------------------------------------------------------    Component Value Date/Time   BNP 188.2 (H) 11/13/2018 0545    Micro Results No results found for this or any previous visit (from the past 240 hour(s)).  Radiology Reports Dg Chest 2 View  Result Date: 11/10/2018 CLINICAL DATA:  COVID-19 positive.  Shortness of breath. EXAM: CHEST - 2 VIEW COMPARISON:  July 17, 2009 FINDINGS: Bilateral pulmonary infiltrates, left greater than right, consistent with the patient's history of COVID-19. No other acute abnormalities are identified. IMPRESSION: Bilateral pulmonary infiltrates, left greater than right, consistent with reported history of COVID-19. Electronically Signed   By: Dorise Bullion III M.D   On: 11/10/2018 15:42

## 2018-11-13 NOTE — Progress Notes (Signed)
CBG results for insulin gtt  0800: 121 rate changed to 0.5 unit/hr.  0900:

## 2018-11-14 LAB — COMPREHENSIVE METABOLIC PANEL
ALT: 56 U/L — ABNORMAL HIGH (ref 0–44)
AST: 59 U/L — ABNORMAL HIGH (ref 15–41)
Albumin: 3.3 g/dL — ABNORMAL LOW (ref 3.5–5.0)
Alkaline Phosphatase: 53 U/L (ref 38–126)
Anion gap: 8 (ref 5–15)
BUN: 22 mg/dL — ABNORMAL HIGH (ref 6–20)
CO2: 25 mmol/L (ref 22–32)
Calcium: 8.5 mg/dL — ABNORMAL LOW (ref 8.9–10.3)
Chloride: 106 mmol/L (ref 98–111)
Creatinine, Ser: 0.61 mg/dL (ref 0.61–1.24)
GFR calc Af Amer: >60 mL/min (ref >60)
GFR calc non Af Amer: >60 mL/min (ref >60)
Glucose, Bld: 188 mg/dL — ABNORMAL HIGH (ref 70–99)
Potassium: 3.9 mmol/L (ref 3.5–5.1)
Sodium: 139 mmol/L (ref 135–145)
Total Bilirubin: 0.8 mg/dL (ref 0.3–1.2)
Total Protein: 6.4 g/dL — ABNORMAL LOW (ref 6.5–8.1)

## 2018-11-14 LAB — C-REACTIVE PROTEIN: CRP: 1.8 mg/dL — ABNORMAL HIGH (ref <1.0)

## 2018-11-14 LAB — CBC WITH DIFFERENTIAL/PLATELET
Abs Immature Granulocytes: 0.04 10*3/uL (ref 0.00–0.07)
Basophils Absolute: 0 10*3/uL (ref 0.0–0.1)
Basophils Relative: 0 %
Eosinophils Absolute: 0 10*3/uL (ref 0.0–0.5)
Eosinophils Relative: 0 %
HCT: 46.5 % (ref 39.0–52.0)
Hemoglobin: 16.3 g/dL (ref 13.0–17.0)
Immature Granulocytes: 1 %
Lymphocytes Relative: 30 %
Lymphs Abs: 2.4 10*3/uL (ref 0.7–4.0)
MCH: 30.5 pg (ref 26.0–34.0)
MCHC: 35.1 g/dL (ref 30.0–36.0)
MCV: 87.1 fL (ref 80.0–100.0)
Monocytes Absolute: 0.6 10*3/uL (ref 0.1–1.0)
Monocytes Relative: 7 %
Neutro Abs: 5 10*3/uL (ref 1.7–7.7)
Neutrophils Relative %: 62 %
Platelets: 208 10*3/uL (ref 150–400)
RBC: 5.34 MIL/uL (ref 4.22–5.81)
RDW: 11.9 % (ref 11.5–15.5)
WBC: 8 10*3/uL (ref 4.0–10.5)
nRBC: 0 % (ref 0.0–0.2)

## 2018-11-14 LAB — BRAIN NATRIURETIC PEPTIDE: B Natriuretic Peptide: 144.2 pg/mL — ABNORMAL HIGH (ref 0.0–100.0)

## 2018-11-14 LAB — FERRITIN: Ferritin: 451 ng/mL — ABNORMAL HIGH (ref 24–336)

## 2018-11-14 LAB — LACTATE DEHYDROGENASE: LDH: 230 U/L — ABNORMAL HIGH (ref 98–192)

## 2018-11-14 LAB — D-DIMER, QUANTITATIVE: D-Dimer, Quant: 0.41 ug/mL-FEU (ref 0.00–0.50)

## 2018-11-14 LAB — MAGNESIUM: Magnesium: 1.9 mg/dL (ref 1.7–2.4)

## 2018-11-14 MED ORDER — AMLODIPINE BESYLATE 10 MG PO TABS: 10.0000 mg | ORAL_TABLET | Freq: Every day | ORAL | 0 refills | Status: DC

## 2018-11-14 MED ORDER — PREDNISONE 20 MG PO TABS
20.0000 mg | ORAL_TABLET | Freq: Every day | ORAL | Status: DC
  Administered 2018-11-14: 09:00:00 20 mg via ORAL
  Filled 2018-11-14: qty 1

## 2018-11-14 MED ORDER — INSULIN ASPART 100 UNIT/ML ~~LOC~~ SOLN: SUBCUTANEOUS | 0 refills | Status: DC

## 2018-11-14 MED ORDER — INSULIN GLARGINE 100 UNIT/ML ~~LOC~~ SOLN
35.0000 [IU] | Freq: Every day | SUBCUTANEOUS | Status: DC
  Administered 2018-11-14: 35 [IU] via SUBCUTANEOUS
  Filled 2018-11-14: qty 0.35

## 2018-11-14 MED ORDER — INSULIN ASPART 100 UNIT/ML ~~LOC~~ SOLN: 0.0000 [IU] | Freq: Every day | SUBCUTANEOUS | Status: DC

## 2018-11-14 MED ORDER — INSULIN GLARGINE 100 UNIT/ML ~~LOC~~ SOLN: 25.0000 [IU] | Freq: Every day | SUBCUTANEOUS | Status: DC

## 2018-11-14 MED ORDER — NEBIVOLOL HCL 2.5 MG PO TABS: 2.5000 mg | ORAL_TABLET | Freq: Every day | ORAL | 0 refills | Status: DC

## 2018-11-14 MED ORDER — INSULIN ASPART 100 UNIT/ML ~~LOC~~ SOLN
0.0000 [IU] | Freq: Three times a day (TID) | SUBCUTANEOUS | Status: DC
  Administered 2018-11-14: 12:00:00 8 [IU] via SUBCUTANEOUS

## 2018-11-14 MED ORDER — PREDNISONE 5 MG PO TABS: ORAL_TABLET | ORAL | 0 refills | Status: DC

## 2018-11-14 MED ORDER — "INSULIN SYRINGE-NEEDLE U-100 25G X 1"" 1 ML MISC": 0 refills | Status: DC

## 2018-11-14 MED ORDER — INSULIN ASPART 100 UNIT/ML ~~LOC~~ SOLN
2.0000 [IU] | Freq: Three times a day (TID) | SUBCUTANEOUS | Status: DC
  Administered 2018-11-14: 09:00:00 2 [IU] via SUBCUTANEOUS

## 2018-11-14 NOTE — Plan of Care (Signed)
All vitals stable and pt able to maintain saturation on room air. Will continue current plan of care until discharge.

## 2018-11-14 NOTE — Progress Notes (Signed)
Al discharge paperwork prepared. Reviewed all instructions, educations, follow-up appointments, and new medications with the pt. He verbalized an understanding of all information. Prescriptions given to the pt. He has packed all of his belongings, waiting for his wife to arrive soon for discharge from facility.

## 2018-11-14 NOTE — Discharge Summary (Signed)
Peter Keller ZOX:[id number] DOB: 11/18/1975 DOA: 11/10/2018  PCP: Hoyt Koch, MD  Admit date: 11/10/2018  Discharge date: 11/14/2018  Admitted From: Home   Disposition:  Home   Recommendations for Outpatient Follow-up:   Follow up with PCP in 1-2 weeks  PCP Please obtain BMP/CBC, 2 view CXR in 1week,  (see Discharge instructions)   PCP Please follow up on the following pending results: Monitor A1c, BP and CMP, CBC   Home Health: None   Equipment/Devices: None  Consultations: None  Discharge Condition: Stable    CODE STATUS: Full    Diet Recommendation: Heart Healthy Low Carb  Diet Order            Diet heart healthy/carb modified Room service appropriate? Yes; Fluid consistency: Thin  Diet effective now               CC - SOB  Brief history of present illness from the day of admission and additional interim summary    Peter Raiis a 43 y.o.malewith medical history significant ofDM-2, HTN, fatty liver, EtOH in the past, who was diagnosed with COVID-19 infection on 11/01/2018 and subsequently developed some gastroenteritis.  He started developing some shortness of breath on exertion and had an outpatient x-ray showing pneumonia around 11/08/18, he was started outpatient on oral steroids but symptoms continue to flare and he was admitted to the hospital with acute hypoxic respiratory failure due to COVID-19 pneumonitis.                                                                 Hospital Course   1. Acute Hypoxic Resp. Failure due to Acute Covid 19 Viral Pneumonitis during the ongoing 2020 Covid 19 Pandemic -  Has failed outpatient steroid treatment, CRP was climbing despite steroid use for several days prior to admission in the outpatient setting, he had progressed from exertional shortness  of breath to hypoxia at rest and now requiring 2 L nasal cannula oxygen and increased work of breathing and was breathing close to 22/min to 25/min.  Treated with full protocol which included IV steroids, Remdisvir  and convalescent plasma on 11/10/2018 and Actemra in the afternoon of 11/11/2018.    He made remarkable recovery and within 24 hours he was down to room air, he is now ambulating in the room on room air and symptom-free, minimal cough, inflammatory markers have come down in unremarkable fashion as well.  He will finish his Remdisvir course today and will be discharged home on a short course of oral steroid taper on room air with close outpatient PCP follow-up.    COVID-19 Labs  Recent Labs    11/12/18 0235 11/13/18 0545 11/14/18 0430  DDIMER 0.33 <0.27 0.41  FERRITIN 492* 422* 451*  LDH 217* 215* 230*  CRP 7.2*  3.0* 1.8*    Lab Results  Component Value Date   SARSCOV2NAA Detected (A) 10/31/2018     2.  History of fatty liver.  Outpatient follow-up with PCP.   3.  Past history of alcohol abuse.  Has quit by himself several weeks ago, no signs of DTs will monitor.   4.  GERD.  On PPI.   5.  History of dyslipidemia.  Statins once clinically improved and liver function remained stable.    6.  Essential hypertension with resting bradycardia especially at night.  Dropped beta-blocker dose added Norvasc for better control.  7.  Mild thrombocytopenia.  Due to underlying fatty liver, possibly acute COVID-19 viral infection also contributing to it.  No signs of bleeding I will request his PCP to repeat CBC in 7 to 10 days post discharge.  8.  DM type II.  Oral medications held, outpatient control was poor due to hyperglycemia A1c was 8.7.  Since he is going on short course of oral steroid taper he will be getting sliding scale insulin, he does not desire to be placed on long-acting insulin, does have some more oral hypoglycemics at home which she has taken in the past and  prefers to take them under the guidance of his wife who is also a physician.  In the meantime sliding scale NovoLog will be provided just in case he needs it.  I also requested him to follow with his endocrinologist within 7 to 10 days for future glycemic control and diabetes management.  Lab Results  Component Value Date   HGBA1C 8.7 (H) 11/10/2018     Discharge diagnosis     Principal Problem:   Pneumonia due to COVID-19 virus Active Problems:   GERD   Alcohol dependence ([place])   Essential hypertension, benign   Type 2 diabetes mellitus with hyperlipidemia ([place])   Fatty liver    Discharge instructions    Discharge Instructions    Discharge instructions   Complete by: As directed    Follow with Primary MD Hoyt Koch, MD in 7 days   Get CBC, CMP, 2 view Chest X ray -  checked next visit within 1 week by Primary MD   Activity: As tolerated    Disposition Home    Diet: Heart Healthy - Low Carb.  Please check blood glucose QA CHS and maintain a logbook.  Please discuss your sugar control with your PCP and endocrinologist within the next 7 to 10 days.  Special Instructions: If you have smoked or chewed Tobacco  in the last 2 yrs please stop smoking, stop any regular Alcohol  and or any Recreational drug use.  On your next visit with your primary care physician please Get Medicines reviewed and adjusted.  Please request your Prim.MD to go over all Hospital Tests and Procedure/Radiological results at the follow up, please get all Hospital records sent to your Prim MD by signing hospital release before you go home.  If you experience worsening of your admission symptoms, develop shortness of breath, life threatening emergency, suicidal or homicidal thoughts you must seek medical attention immediately by calling 911 or calling your MD immediately  if symptoms less severe.   MyChart COVID-19 home monitoring program   Complete by: Nov 14, 2018    Is the patient willing  to use the [place] for home monitoring?: Yes   Temperature monitoring   Complete by: Nov 14, 2018    After how many days would you like to  receive a notification of this patient's flowsheet entries?: 1      Discharge Medications   Allergies as of 11/14/2018   No Known Allergies     Medication List    TAKE these medications   amLODipine 10 MG tablet Commonly known as: NORVASC Take 1 tablet (10 mg total) by mouth daily. Start taking on: November 15, 2018   atorvastatin 20 MG tablet Commonly known as: LIPITOR TAKE ONE TABLET BY MOUTH DAILY   buPROPion 150 MG 24 hr tablet Commonly known as: WELLBUTRIN XL TAKE ONE TABLET BY MOUTH DAILY   esomeprazole 20 MG capsule Commonly known as: NEXIUM Take 20 mg by mouth daily at 12 noon.   fenofibrate 145 MG tablet Commonly known as: TRICOR TAKE ONE TABLET BY MOUTH DAILY   FreeStyle Libre Reader Devi 1 Device by Does not apply route daily.   FreeStyle [employer] System Misc 1 Device by Does not apply route every 30 (thirty) days.   insulin aspart 100 UNIT/ML injection Commonly known as: NovoLOG Can switch to any other cheaper alternative.. Before each meal 3 times a day , 140-199 - 2 units, 200-250 - 4 units, 251-299 - 8 units,  300-349 - 10 units,  350 or above 14 units. Insulin PEN if approved, provide syringes and needles if needed.   Insulin Syringe-Needle U-100 25G X 1" 1 ML Misc For 4 times a day insulin SQ, 1 month supply. Diagnosis E11.65   metFORMIN 1000 MG tablet Commonly known as: GLUCOPHAGE TAKE ONE TABLET BY MOUTH TWICE A DAY WITH A MEAL.   nebivolol 2.5 MG tablet Commonly known as: BYSTOLIC Take 1 tablet (2.5 mg total) by mouth daily. Start taking on: November 15, 2018 What changed:   medication strength  See the new instructions.   nystatin-triamcinolone ointment Commonly known as: MYCOLOG Apply 1 application topically 2 (two) times daily.   OneTouch Delica Lancets 65K Misc USE TO OBTAIN A  BLOOD SPECIMEN ONCE PER DAY   OneTouch Verio IQ System w/Device Kit Use as directed   OneTouch Verio test strip Generic drug: glucose blood USE AS INSTRUCTED TO CHECK BLOOD SUGAR ONCE DAILY   predniSONE 5 MG tablet Commonly known as: DELTASONE Label  & dispense according to the schedule below. Take 6 Pills PO for 2 days, 4 Pills PO for 2 days, 2 Pills PO for 1 days, 1 Pills PO for 1 days  then STOP. Total 25 pills.   Vitamin D 400 units capsule Take 500 Units by mouth daily.       Follow-up Information    Hoyt Koch, MD. Schedule an appointment as soon as possible for a visit in 1 week(s).   Specialty: Internal Medicine Why: And with your endocrinologist in a week. Contact information: [address] [place] [id number] [phone]           Major procedures and Radiology Reports - PLEASE review detailed and final reports thoroughly  -        Dg Chest 2 View  Result Date: 11/10/2018 CLINICAL DATA:  COVID-19 positive.  Shortness of breath. EXAM: CHEST - 2 VIEW COMPARISON:  July 17, 2009 FINDINGS: Bilateral pulmonary infiltrates, left greater than right, consistent with the patient's history of COVID-19. No other acute abnormalities are identified. IMPRESSION: Bilateral pulmonary infiltrates, left greater than right, consistent with reported history of COVID-19. Electronically Signed   By: Dorise Bullion III M.D   On: 11/10/2018 15:42    Micro Results  No results found for this or any previous visit (from the past 240 hour(s)).  Today   Subjective    Deyton Dicioccio today has no headache,no chest abdominal pain,no new weakness tingling or numbness, feels much better wants to go home today.     Objective   Blood pressure (!) 125/91, pulse 80, temperature 97.8 F (36.6 C), temperature source Oral, resp. rate 20, height '5\' 9"'$  (1.753 m), weight 95.3 kg, SpO2 91 %.   Intake/Output Summary (Last 24 hours) at 11/14/2018 0929 Last data filed at  11/14/2018 0400 Gross per 24 hour  Intake 1840.09 ml  Output -  Net 1840.09 ml    Exam Awake Alert, Oriented x 3, No new F.N deficits, Normal affect [place].AT,PERRAL Supple Neck,No JVD, No cervical lymphadenopathy appriciated.  Symmetrical Chest wall movement, Good air movement bilaterally, CTAB RRR,No Gallops,Rubs or new Murmurs, No Parasternal Heave +ve B.Sounds, Abd Soft, Non tender, No organomegaly appriciated, No rebound -guarding or rigidity. No Cyanosis, Clubbing or edema, No new Rash or bruise   Data Review   CBC w Diff:  Lab Results  Component Value Date   WBC 8.0 11/14/2018   HGB 16.3 11/14/2018   HCT 46.5 11/14/2018   PLT 208 11/14/2018   LYMPHOPCT 30 11/14/2018   MONOPCT 7 11/14/2018   EOSPCT 0 11/14/2018   BASOPCT 0 11/14/2018    CMP:  Lab Results  Component Value Date   NA 139 11/14/2018   K 3.9 11/14/2018   CL 106 11/14/2018   CO2 25 11/14/2018   BUN 22 (H) 11/14/2018   CREATININE 0.61 11/14/2018   CREATININE 0.65 01/30/2017   PROT 6.4 (L) 11/14/2018   ALBUMIN 3.3 (L) 11/14/2018   BILITOT 0.8 11/14/2018   ALKPHOS 53 11/14/2018   AST 59 (H) 11/14/2018   ALT 56 (H) 11/14/2018  . Lab Results  Component Value Date   HGBA1C 8.7 (H) 11/10/2018    Total Time in preparing paper work, data evaluation and todays exam - 40 minutes  Lala Lund M.D on 11/14/2018 at 9:29 AM  [facility]   Office  [phone]

## 2018-11-14 NOTE — Plan of Care (Signed)
Educated ob breathing exorcises

## 2018-11-14 NOTE — Plan of Care (Signed)
  Problem: Education: Goal: Knowledge of risk factors and measures for prevention of condition will improve 11/14/2018 1247 by Herma Ard, RN Outcome: Adequate for Discharge 11/14/2018 0920 by Herma Ard, RN Outcome: Progressing   Problem: Coping: Goal: Psychosocial and spiritual needs will be supported 11/14/2018 1247 by Herma Ard, RN Outcome: Adequate for Discharge 11/14/2018 0920 by Herma Ard, RN Outcome: Progressing   Problem: Respiratory: Goal: Will maintain a patent airway 11/14/2018 1247 by Herma Ard, RN Outcome: Adequate for Discharge 11/14/2018 0920 by Herma Ard, RN Outcome: Progressing Goal: Complications related to the disease process, condition or treatment will be avoided or minimized 11/14/2018 1247 by Herma Ard, RN Outcome: Adequate for Discharge 11/14/2018 0920 by Herma Ard, RN Outcome: Progressing

## 2018-11-14 NOTE — Discharge Instructions (Signed)
Follow with Primary MD Hoyt Koch, MD in 7 days   Get CBC, CMP, 2 view Chest X ray -  checked next visit within 1 week by Primary MD   Activity: As tolerated    Disposition Home    Diet: Heart Healthy - Low Carb.  Please check blood glucose QA CHS and maintain a logbook.  Please discuss your sugar control with your PCP and endocrinologist within the next 7 to 10 days.  Special Instructions: If you have smoked or chewed Tobacco  in the last 2 yrs please stop smoking, stop any regular Alcohol  and or any Recreational drug use.  On your next visit with your primary care physician please Get Medicines reviewed and adjusted.  Please request your Prim.MD to go over all Hospital Tests and Procedure/Radiological results at the follow up, please get all Hospital records sent to your Prim MD by signing hospital release before you go home.  If you experience worsening of your admission symptoms, develop shortness of breath, life threatening emergency, suicidal or homicidal thoughts you must seek medical attention immediately by calling 911 or calling your MD immediately  if symptoms less severe.        Person Under Monitoring Name: Peter Keller  Location: [place] [address]   Infection Prevention Recommendations for Individuals Confirmed to have, or Being Evaluated for, 2019 Novel Coronavirus (COVID-19) Infection Who Receive Care at Home  Individuals who are confirmed to have, or are being evaluated for, COVID-19 should follow the prevention steps below until a healthcare provider or local or state health department says they can return to normal activities.  Stay home except to get medical care You should restrict activities outside your home, except for getting medical care. Do not go to work, school, or public areas, and do not use public transportation or taxis.  Call ahead before visiting your doctor Before your medical appointment, call the  healthcare provider and tell them that you have, or are being evaluated for, COVID-19 infection. This will help the healthcare providers office take steps to keep other people from getting infected. Ask your healthcare provider to call the local or state health department.  Monitor your symptoms Seek prompt medical attention if your illness is worsening (e.g., difficulty breathing). Before going to your medical appointment, call the healthcare provider and tell them that you have, or are being evaluated for, COVID-19 infection. Ask your healthcare provider to call the local or state health department.  Wear a facemask You should wear a facemask that covers your nose and mouth when you are in the same room with other people and when you visit a healthcare provider. People who live with or visit you should also wear a facemask while they are in the same room with you.  Separate yourself from other people in your home As much as possible, you should stay in a different room from other people in your home. Also, you should use a separate bathroom, if available.  Avoid sharing household items You should not share dishes, drinking glasses, cups, eating utensils, towels, bedding, or other items with other people in your home. After using these items, you should wash them thoroughly with soap and water.  Cover your coughs and sneezes Cover your mouth and nose with a tissue when you cough or sneeze, or you can cough or sneeze into your sleeve. Throw used tissues in a lined trash can, and immediately wash your hands with soap and water for  at least 20 seconds or use an alcohol-based hand rub.  Wash your [employer]hands Wash your hands often and thoroughly with soap and water for at least 20 seconds. You can use an alcohol-based hand sanitizer if soap and water are not available and if your hands are not visibly dirty. Avoid touching your eyes, nose, and mouth with unwashed hands.   Prevention Steps for  Caregivers and Household Members of Individuals Confirmed to have, or Being Evaluated for, COVID-19 Infection Being Cared for in the Home  If you live with, or provide care at home for, a person confirmed to have, or being evaluated for, COVID-19 infection please follow these guidelines to prevent infection:  Follow healthcare providers instructions Make sure that you understand and can help the patient follow any healthcare provider instructions for all care.  Provide for the patients basic needs You should help the patient with basic needs in the home and provide support for getting groceries, prescriptions, and other personal needs.  Monitor the patients symptoms If they are getting sicker, call his or her medical provider and tell them that the patient has, or is being evaluated for, COVID-19 infection. This will help the healthcare providers office take steps to keep other people from getting infected. Ask the healthcare provider to call the local or state health department.  Limit the number of people who have contact with the patient  If possible, have only one caregiver for the patient.  Other household members should stay in another home or place of residence. If this is not possible, they should stay  in another room, or be separated from the patient as much as possible. Use a separate bathroom, if available.  Restrict visitors who do not have an essential need to be in the home.  Keep older adults, very young children, and other sick people away from the patient Keep older adults, very young children, and those who have compromised immune systems or chronic health conditions away from the patient. This includes people with chronic heart, lung, or kidney conditions, diabetes, and cancer.  Ensure good ventilation Make sure that shared spaces in the home have good air flow, such as from an air conditioner or an opened window, weather permitting.  Wash your hands  often  Wash your hands often and thoroughly with soap and water for at least 20 seconds. You can use an alcohol based hand sanitizer if soap and water are not available and if your hands are not visibly dirty.  Avoid touching your eyes, nose, and mouth with unwashed hands.  Use disposable paper towels to dry your hands. If not available, use dedicated cloth towels and replace them when they become wet.  Wear a facemask and gloves  Wear a disposable facemask at all times in the room and gloves when you touch or have contact with the patients blood, body fluids, and/or secretions or excretions, such as sweat, saliva, sputum, nasal mucus, vomit, urine, or feces.  Ensure the mask fits over your nose and mouth tightly, and do not touch it during use.  Throw out disposable facemasks and gloves after using them. Do not reuse.  Wash your hands immediately after removing your facemask and gloves.  If your personal clothing becomes contaminated, carefully remove clothing and launder. Wash your hands after handling contaminated clothing.  Place all used disposable facemasks, gloves, and other waste in a lined container before disposing them with other household waste.  Remove gloves and wash your hands immediately after handling these  items.  Do not share dishes, glasses, or other household items with the patient  Avoid sharing household items. You should not share dishes, drinking glasses, cups, eating utensils, towels, bedding, or other items with a patient who is confirmed to have, or being evaluated for, COVID-19 infection.  After the person uses these items, you should wash them thoroughly with soap and water.  Wash laundry thoroughly  Immediately remove and wash clothes or bedding that have blood, body fluids, and/or secretions or excretions, such as sweat, saliva, sputum, nasal mucus, vomit, urine, or feces, on them.  Wear gloves when handling laundry from the patient.  Read and follow  directions on labels of laundry or clothing items and detergent. In general, wash and dry with the warmest temperatures recommended on the label.  Clean all areas the individual has used often  Clean all touchable surfaces, such as counters, tabletops, doorknobs, bathroom fixtures, toilets, phones, keyboards, tablets, and bedside tables, every day. Also, clean any surfaces that may have blood, body fluids, and/or secretions or excretions on them.  Wear gloves when cleaning surfaces the patient has come in contact with.  Use a diluted bleach solution (e.g., dilute bleach with 1 part bleach and 10 parts water) or a household disinfectant with a label that says EPA-registered for coronaviruses. To make a bleach solution at home, add 1 tablespoon of bleach to 1 quart (4 cups) of water. For a larger supply, add  cup of bleach to 1 gallon (16 cups) of water.  Read labels of cleaning products and follow recommendations provided on product labels. Labels contain instructions for safe and effective use of the cleaning product including precautions you should take when applying the product, such as wearing gloves or eye protection and making sure you have good ventilation during use of the product.  Remove gloves and wash hands immediately after cleaning.  Monitor yourself for signs and symptoms of illness Caregivers and household members are considered close contacts, should monitor their health, and will be asked to limit movement outside of the home to the extent possible. Follow the monitoring steps for close contacts listed on the symptom monitoring form.   ? If you have additional questions, contact your local health department or call the epidemiologist on call at [phone] (available 24/7). ? This guidance is subject to change. For the most up-to-date guidance from [facility], please refer to their website: [email or web address]

## 2018-11-15 ENCOUNTER — Telehealth: Payer: Self-pay | Admitting: *Deleted

## 2018-11-15 NOTE — Telephone Encounter (Signed)
Called and LVM stating nurse was f/u after patient's hospital discharge. Nurse explained that the patient's instructions stated for them to make an appointment with PCP and nurse wanted to ensure they did not have any questions or concerns regarding their discharge. Nurse left her callback number for patient to reach her and she will call patient back later.   

## 2018-11-27 ENCOUNTER — Other Ambulatory Visit: Payer: Self-pay | Admitting: Internal Medicine

## 2018-11-27 NOTE — Telephone Encounter (Signed)
It would appear his diabetes is not well controlled with recent HgA1c 8.7 in the hospital. He should have virtual visit or in person visit (if 21 days from positive covid and feeling well from respiratory symptoms) to discuss if this is appropriate.

## 2018-11-27 NOTE — Telephone Encounter (Signed)
Can you schedule patient either a virtual visit to discuss BS or can schedule in office 21 days from positive covid test and feeling well from respiratory symptoms. Can send in 30 day supply to get him to that appointment

## 2018-11-27 NOTE — Telephone Encounter (Signed)
Left message for patient to call back to schedule.  °

## 2018-11-27 NOTE — Telephone Encounter (Signed)
Can do 30 day if needed to visit date

## 2018-11-30 ENCOUNTER — Other Ambulatory Visit: Payer: Self-pay | Admitting: Internal Medicine

## 2018-11-30 MED ORDER — NEBIVOLOL HCL 2.5 MG PO TABS: 2.5000 mg | ORAL_TABLET | Freq: Every day | ORAL | 0 refills | Status: DC

## 2018-11-30 NOTE — Addendum Note (Signed)
Addended by: Derl Barrow on: 11/30/2018 04:25 PM   Modules accepted: Orders

## 2018-12-10 ENCOUNTER — Encounter: Payer: Self-pay | Admitting: Internal Medicine

## 2018-12-10 ENCOUNTER — Ambulatory Visit (INDEPENDENT_AMBULATORY_CARE_PROVIDER_SITE_OTHER): Payer: 59 | Admitting: Internal Medicine

## 2018-12-10 ENCOUNTER — Other Ambulatory Visit (INDEPENDENT_AMBULATORY_CARE_PROVIDER_SITE_OTHER): Payer: 59

## 2018-12-10 DIAGNOSIS — R0602 Shortness of breath: Secondary | ICD-10-CM

## 2018-12-10 DIAGNOSIS — E785 Hyperlipidemia, unspecified: Secondary | ICD-10-CM

## 2018-12-10 DIAGNOSIS — E1169 Type 2 diabetes mellitus with other specified complication: Secondary | ICD-10-CM

## 2018-12-10 DIAGNOSIS — G44201 Tension-type headache, unspecified, intractable: Secondary | ICD-10-CM | POA: Insufficient documentation

## 2018-12-10 LAB — VITAMIN B12: Vitamin B-12: 687 pg/mL (ref 211–911)

## 2018-12-10 LAB — COMPREHENSIVE METABOLIC PANEL
ALT: 49 U/L (ref 0–53)
AST: 37 U/L (ref 0–37)
Albumin: 4.7 g/dL (ref 3.5–5.2)
Alkaline Phosphatase: 60 U/L (ref 39–117)
BUN: 16 mg/dL (ref 6–23)
CO2: 25 mEq/L (ref 19–32)
Calcium: 9.5 mg/dL (ref 8.4–10.5)
Chloride: 99 mEq/L (ref 96–112)
Creatinine, Ser: 0.75 mg/dL (ref 0.40–1.50)
GFR: 113.58 mL/min (ref >60.00)
Glucose, Bld: 170 mg/dL — ABNORMAL HIGH (ref 70–99)
Potassium: 3.6 mEq/L (ref 3.5–5.1)
Sodium: 138 mEq/L (ref 135–145)
Total Bilirubin: 1 mg/dL (ref 0.2–1.2)
Total Protein: 7.4 g/dL (ref 6.0–8.3)

## 2018-12-10 LAB — VITAMIN D 25 HYDROXY (VIT D DEFICIENCY, FRACTURES): VITD: 33.55 ng/mL (ref 30.00–100.00)

## 2018-12-10 LAB — CBC
HCT: 47.5 % (ref 39.0–52.0)
Hemoglobin: 16.9 g/dL (ref 13.0–17.0)
MCHC: 35.6 g/dL (ref 30.0–36.0)
MCV: 90.4 fl (ref 78.0–100.0)
Platelets: 125 10*3/uL — ABNORMAL LOW (ref 150.0–400.0)
RBC: 5.25 Mil/uL (ref 4.22–5.81)
RDW: 14.6 % (ref 11.5–15.5)
WBC: 5.2 10*3/uL (ref 4.0–10.5)

## 2018-12-10 LAB — D-DIMER, QUANTITATIVE: D-Dimer, Quant: <0.19 mcg/mL FEU (ref <0.50)

## 2018-12-10 LAB — TSH: TSH: 1.73 u[IU]/mL (ref 0.35–4.50)

## 2018-12-10 MED ORDER — CANAGLIFLOZIN 300 MG PO TABS: 300.0000 mg | ORAL_TABLET | Freq: Every day | ORAL | 3 refills | Status: DC

## 2018-12-10 MED ORDER — NEBIVOLOL HCL 5 MG PO TABS: 5.0000 mg | ORAL_TABLET | Freq: Every day | ORAL | 3 refills | Status: DC

## 2018-12-10 NOTE — Assessment & Plan Note (Signed)
Wants to go back on invokana as this was doing much better for him than farxiga.

## 2018-12-10 NOTE — Assessment & Plan Note (Signed)
After covid-19 and likely is just sequelae from that. Checking d-dimer to see if significant change from discharge. Also checking other labs. Overall he is improved and not a risk to stop quarantine. May need CT chest.

## 2018-12-10 NOTE — Progress Notes (Signed)
Virtual Visit via Video Note  I connected with Peter Keller on 12/10/18 at 11:00 AM EDT by a video enabled telemedicine application and verified that I am speaking with the correct person using two identifiers.  The patient and the provider were at separate locations throughout the entire encounter.   I discussed the limitations of evaluation and management by telemedicine and the availability of in person appointments. The patient expressed understanding and agreed to proceed. The patient and the provider were the only parties present for the visit unless noted in HPI below.  History of Present Illness: The patient is a 43 y.o. man with visit for several concerns post hospital (in for covid-19 pneumonia and got steroids, remdisivir, actempra and convalescent plasma).  Started 10/31/18 with positive test and admitted 11/10/18 with pneumonia. Had dropping oxygen levels and treated in the hospital with recovery to 97% oxygen levels. Now that he is home he is walking some but not much and his oxygen levels are now 92-93%. He did not take aspirin on discharge due to confusion with his folic acid pills he was taking double of those. Has SOB with activity some. Mild discomfort as well. Denies cough still. Denies much activity yet. Overall it is stable. Has tried nothing. Also since covid-19 is having headaches on the right side of the head and this is concerning to him. Some days mild and others more severe.   PMH, [place], social history reviewed and updated  Observations/Objective: Appearance: normal, breathing appears normal, no dyspnea or coughing during visit, casual grooming, abdomen does not appear distended, throat normal, mental status is A and O times 3  Assessment and Plan: See problem oriented charting  Follow Up Instructions: MRI brain for headaches, checking labs and d-dimer and vitamin levels for SOB and headaches  I discussed the assessment and treatment plan with the patient. The patient was  provided an opportunity to ask questions and all were answered. The patient agreed with the plan and demonstrated an understanding of the instructions.   The patient was advised to call back or seek an in-person evaluation if the symptoms worsen or if the condition fails to improve as anticipated.  Hoyt Koch, MD

## 2018-12-10 NOTE — Assessment & Plan Note (Signed)
Concerning given recent covid-19. Needs MRI and labs to rule out vitamin deficiency.

## 2018-12-11 ENCOUNTER — Telehealth: Payer: Self-pay

## 2018-12-11 NOTE — Telephone Encounter (Signed)
PA started on CoverMyMeds [id number]   PA approved from 12/10/2018-12/10/2019

## 2018-12-13 ENCOUNTER — Encounter: Payer: 59 | Admitting: Internal Medicine

## 2018-12-19 ENCOUNTER — Other Ambulatory Visit: Payer: Self-pay | Admitting: Internal Medicine

## 2018-12-23 ENCOUNTER — Other Ambulatory Visit: Payer: Self-pay

## 2018-12-23 ENCOUNTER — Ambulatory Visit
Admission: RE | Admit: 2018-12-23 | Discharge: 2018-12-23 | Disposition: A | Discharge status: Home / Self Care | Payer: 59 | Source: Ambulatory Visit | Attending: Internal Medicine | Admitting: Internal Medicine

## 2018-12-23 DIAGNOSIS — G44201 Tension-type headache, unspecified, intractable: Secondary | ICD-10-CM

## 2019-01-29 ENCOUNTER — Other Ambulatory Visit: Payer: Self-pay

## 2019-01-29 ENCOUNTER — Ambulatory Visit (INDEPENDENT_AMBULATORY_CARE_PROVIDER_SITE_OTHER): Payer: 59 | Admitting: Internal Medicine

## 2019-01-29 ENCOUNTER — Encounter: Payer: Self-pay | Admitting: Internal Medicine

## 2019-01-29 DIAGNOSIS — E1169 Type 2 diabetes mellitus with other specified complication: Secondary | ICD-10-CM | POA: Diagnosis not present

## 2019-01-29 DIAGNOSIS — E6609 Other obesity due to excess calories: Secondary | ICD-10-CM

## 2019-01-29 DIAGNOSIS — Z6832 Body mass index (BMI) 32.0-32.9, adult: Secondary | ICD-10-CM | POA: Diagnosis not present

## 2019-01-29 DIAGNOSIS — E785 Hyperlipidemia, unspecified: Secondary | ICD-10-CM | POA: Diagnosis not present

## 2019-01-29 MED ORDER — ONETOUCH VERIO VI STRP: ORAL_STRIP | 3 refills | Status: DC

## 2019-01-29 MED ORDER — FENOFIBRATE 145 MG PO TABS: 145.0000 mg | ORAL_TABLET | Freq: Every day | ORAL | 3 refills | Status: DC

## 2019-01-29 MED ORDER — TRULICITY 3 MG/0.5ML ~~LOC~~ SOAJ: 3.0000 mg | SUBCUTANEOUS | 3 refills | Status: DC

## 2019-01-29 MED ORDER — FREESTYLE LIBRE 14 DAY SENSOR MISC: 1.0000 | 3 refills | Status: DC

## 2019-01-29 NOTE — Progress Notes (Signed)
Patient ID: Peter Keller, male   DOB: [birth year], 43 y.o.   MRN: [id number]  Patient location: Home My location: Office Persons participating in the virtual visit: patient, provider  Referring Provider: Hoyt Koch, MD  I connected with the patient on 01/29/19 at  9:31 AM EST by a video enabled telemedicine application and verified that I am speaking with the correct person.   I discussed the limitations of evaluation and management by telemedicine and the availability of in person appointments. The patient expressed understanding and agreed to proceed.   Details of the encounter are shown below.  HPI: Peter Keller is a 43 y.o.-year-old male, returning for follow-up for DM2, dx in 04/2013 (A1c was 11%), non-insulin-dependent, uncontrolled, without long term complications. Last visit 2 years ago.  He is not usually compliant with appointments.  He was diagnosed with COVID-19 in 10/2018.  He was briefly admitted due to desaturation and was found to have pneumonia.  He did well afterwards and recovered completely.  His sugars were high during this episode and he was discharged on insulin - but he did not use it.  DM2: Reviewed HbA1c levels: Lab Results  Component Value Date   HGBA1C 8.7 (H) 11/10/2018   HGBA1C 7.2 (H) 04/18/2018   HGBA1C 7.0 (H) 07/26/2017   He is on: - Metformin 1000 mg 2x a day with meals - may forget the dose at night - Trulicity 1.5 mg weekly - Invokana 300 mg before breakfast >> Farxiga 10 >> Invokana 300 (2 mo ago) - Glipizide 5 mg before a larger dinner - added back 01/2017, now only once a mo Patient was initially on Janumet extended-release, which was subsequently stopped.  Amaryl was added 07/2013, but then switched to Invokana 100 mg daily in 09/2013 because of weight gain. Trulicity 5.78 mg weekly was added in 11/2013 for help with weight loss >> increased to 1.5 mg weekly in 01/2014. Metformin extended-release was started back in 03/2014 and  increased to 1500 mg daily.  Pt checks his sugars 1-3 times a day: - am: 119, 143-167, 181 >> 118, 132, 176 >> 140-142, 189 (large sandwich the night before) - 2h after b'fast: 149 >> n/c - before lunch: n/c >> 134, 154 >> 147, 195 - 2h after lunch: n/c >> 266 - before dinner: n/c >> 142 - 2h after dinner: 120-160 >> 121 on diet >> n/c >> 137 - bedtime: n/c >> 209 while in vacation >> n/c - nighttime: n/c Lowest sugar was 118 >> 137; it is unclear at which CBG level he has hypoglycemia awareness Highest sugar was 181 >> 266 recently.  Glucometer: One Probation officer IQ. Also has a Freestyle Libre CGM.  He was exercising regularly at the gym, not recently.  -No CKD, last BUN/creatinine:  Lab Results  Component Value Date   BUN 16 12/10/2018   CREATININE 0.75 12/10/2018   -+ HL;  last set of lipids: Lab Results  Component Value Date   CHOL 163 04/18/2018   HDL 37.20 (L) 04/18/2018   LDLCALC 95 07/26/2017   LDLDIRECT 106.0 04/18/2018   TRIG 273.0 (H) 04/18/2018   CHOLHDL 4 04/18/2018  On Lipitor 20. - last eye exam was in 2019: No DR reportedly -No numbness and tingling in his feet.  She also has a history of HTN. When I saw him last in 01/2017, he just went skiing and forgot BP meds: drank 2 cups of coffee >> HR 125-145 and BP when he returned 213/105.  He  was on Hyzaar, now off.he saw Dr Lovena Le (cardiology).  ROS: Constitutional: no weight gain/no weight loss, no fatigue, no subjective hyperthermia, no subjective hypothermia Eyes: no blurry vision, no xerophthalmia ENT: no sore throat, no nodules palpated in neck, no dysphagia, no odynophagia, no hoarseness Cardiovascular: no CP/no SOB/no palpitations/no leg swelling Respiratory: no cough/no SOB/no wheezing Gastrointestinal: no N/no V/no D/no C/no acid reflux Musculoskeletal: no muscle aches/no joint aches Skin: no rashes, no hair loss Neurological: no tremors/no numbness/no tingling/no dizziness  I reviewed pt's  medications, allergies, PMH, social hx, family hx, and changes were documented in the history of present illness. Otherwise, unchanged from my initial visit note.  Past Medical History:  Diagnosis Date  . Alcohol dependence ([place])   . Diabetes ([place])   . GERD (gastroesophageal reflux disease)   . Hepatic steatosis   . Hyperlipidemia   . Hypertension   . Obesity   . Sinus tachycardia    Past Surgical History:  Procedure Laterality Date  . none     History   Social History  . Marital Status: Married    Spouse Name: N/A  . Number of Children: 2   Occupational History  .  Advertising account executive    Social History Main Topics  . Smoking status: Never Smoker   . Smokeless tobacco: Never Used  . Alcohol Use: Yes     Comment: 3 alcoholic drinks daily  . Drug Use: No   Current Outpatient Medications on File Prior to Visit  Medication Sig Dispense Refill  . amLODipine (NORVASC) 10 MG tablet Take 1 tablet (10 mg total) by mouth daily. 30 tablet 0  . atorvastatin (LIPITOR) 20 MG tablet TAKE ONE TABLET BY MOUTH DAILY 90 tablet 1  . Blood Glucose Monitoring Suppl (ONETOUCH VERIO IQ SYSTEM) w/Device KIT Use as directed 1 kit 0  . buPROPion (WELLBUTRIN XL) 150 MG 24 hr tablet TAKE ONE TABLET BY MOUTH DAILY (Patient not taking: Reported on 11/10/2018) 30 tablet 2  . canagliflozin (INVOKANA) 300 MG TABS tablet Take 1 tablet (300 mg total) by mouth daily before breakfast. 90 tablet 3  . Cholecalciferol (VITAMIN D) 400 UNITS capsule Take 500 Units by mouth daily.    . Continuous Blood Gluc Receiver (FREESTYLE LIBRE READER) DEVI 1 Device by Does not apply route daily. 1 Device 1  . Continuous Blood Gluc Sensor (FREESTYLE LIBRE SENSOR SYSTEM) MISC 1 Device by Does not apply route every 30 (thirty) days. 3 each 11  . esomeprazole (NEXIUM) 20 MG capsule Take 20 mg by mouth daily at 12 noon.    . fenofibrate (TRICOR) 145 MG tablet TAKE ONE TABLET BY MOUTH DAILY 30 tablet 2  . insulin aspart (NOVOLOG)  100 UNIT/ML injection Can switch to any other cheaper alternative.. Before each meal 3 times a day , 140-199 - 2 units, 200-250 - 4 units, 251-299 - 8 units,  300-349 - 10 units,  350 or above 14 units. Insulin PEN if approved, provide syringes and needles if needed. 10 mL 0  . Insulin Syringe-Needle U-100 25G X 1" 1 ML MISC For 4 times a day insulin SQ, 1 month supply. Diagnosis E11.65 30 each 0  . metFORMIN (GLUCOPHAGE) 1000 MG tablet TAKE ONE TABLET BY MOUTH TWICE A DAY WITH A MEAL. 180 tablet 3  . nebivolol (BYSTOLIC) 5 MG tablet Take 1 tablet (5 mg total) by mouth daily. 90 tablet 3  . nystatin-triamcinolone ointment (MYCOLOG) Apply 1 application topically 2 (two) times daily. 100 g 3  .  ONETOUCH DELICA LANCETS 59F MISC USE TO OBTAIN A BLOOD SPECIMEN ONCE PER DAY 100 each 0  . ONETOUCH VERIO test strip USE AS INSTRUCTED TO CHECK BLOOD SUGAR ONCE DAILY 50 each 2   No current facility-administered medications on file prior to visit.   No Known Allergies Family History  Problem Relation Age of Onset  . Hypertension Mother   . Diabetes Mother   . Heart disease Paternal Uncle   . Heart attack Paternal Grandmother   . Colon cancer Neg Hx   . Esophageal cancer Neg Hx   . Stomach cancer Neg Hx    PE: There were no vitals taken for this visit. Wt Readings from Last 3 Encounters:  11/11/18 210 lb (95.3 kg)  04/23/18 218 lb (98.9 kg)  07/26/17 219 lb (99.3 kg)   Constitutional:  in NAD  The physical exam was not performed (virtual visit).  ASSESSMENT: 1. DM2, non-insulin-dependent, controlled, without complications  2. Hypertriglyceridemia  3.  Obesity class I  - Right upper quadrant ultrasound from 08/07/2013 shows probable fatty infiltration, confirmed on the abdominal MRI 08/13/2013 - AST> ALT, consistent with excessive alcohol use.  He stopped drinking alcohol in 10/2018.  PLAN:  1. Patient with history of controlled diabetes, improved on GLP-1 receptor agonist and SGLT2  inhibitor, returning after another long absence.  3 months ago he had COVID-19 infection and was briefly admitted.  He did well afterwards, but sugars were increased and he was on insulin in the hospital and discharge on this.  However, he did not take it as he did not have syringes.   -After his illness, he quit drinking alcohol.  His sugars improved significantly afterwards.  However, he restarted to drink alcohol after he recovered completely. -Reviewed latest HbA1c from 10/2018 and this was 8.7%, much higher than before. -At last visit, I advised him to restart glipizide before large dinner since sugars after such meals were higher.  He is only taking this approximately once a month. -At this visit, he is not checking sugars frequently as his CGM is off.  Whenever he checks, sugars are higher than goal in the morning and this morning his CBG was in the 180s after having a late dinner.  He does not usually check sugars later in the day unless he feels poorly, but he checked occasionally in October and these were mostly above goal. -He tells me that he may forget the second dose of Metformin about 50% of the time.  I advised him to take the evening dose at lunchtime or at breakfast time so that he does not forget it.  I also advised him to increase Trulicity to 3 mg weekly. - I suggested to:  Patient Instructions  Please continue: - Metformin 1000 mg but increase to 2x a day with meals - Invokana 300 mg before breakfast - Glipizide 5 mg before a larger dinner  Increase: - Trulicity to 3 mg weekly  Please attach the Freestyle Libre CGM.  Please return in 3-4 months.  - we will recheck his HbA1c when he returns to the clinic - advised to check sugars at different times of the day -4 times a day with the CGM, rotating check times.  Refilled CGM sensors and strongly advised him to attach it. - advised for yearly eye exams >> he is not UTD - return to clinic in 3-4 months   2.  Hypertriglyceridemia -His triglycerides were in the 900s at the time of the diagnosis, that includes all  statins and fenofibrate.  At last visit he was off the fenofibrate and TriCor. - Reviewed latest lipid panel from 04/2018: LDL slightly above goal, triglycerides high, but improved from before Lab Results  Component Value Date   CHOL 163 04/18/2018   HDL 37.20 (L) 04/18/2018   LDLCALC 95 07/26/2017   LDLDIRECT 106.0 04/18/2018   TRIG 273.0 (H) 04/18/2018   CHOLHDL 4 04/18/2018  - Continues Lipitor 20 and TriCor 145 without side effects. -Refill TriCor  3.  Obesity class I -We will continue the GLP-1 receptor agonist and SGLT2 inhibitor, which should also help with weight loss.  We will also increase Trulicity dose, which will also help. - lost approximately 20 pounds during his COVID-19 episode but gained 10 lbs back.  Philemon Kingdom, MD PhD [facility] Endocrinology

## 2019-01-29 NOTE — Patient Instructions (Addendum)
Please continue: - Metformin 1000 mg but increase to 2x a day with meals - Invokana 300 mg before breakfast - Glipizide 5 mg before a larger dinner  Increase: - Trulicity to 3 mg weekly  Please attach the Freestyle Libre CGM.  Please return in 3-4 months.

## 2019-02-01 ENCOUNTER — Encounter: Payer: Self-pay | Admitting: Internal Medicine

## 2019-02-10 ENCOUNTER — Other Ambulatory Visit: Payer: Self-pay | Admitting: Internal Medicine

## 2019-02-10 MED ORDER — FREESTYLE LIBRE 14 DAY READER DEVI: 1.0000 | Freq: Once | 0 refills | Status: DC

## 2019-02-22 ENCOUNTER — Other Ambulatory Visit: Payer: Self-pay | Admitting: Internal Medicine

## 2019-04-04 ENCOUNTER — Ambulatory Visit: Payer: 59

## 2019-04-04 ENCOUNTER — Other Ambulatory Visit: Payer: Self-pay | Admitting: Internal Medicine

## 2019-04-08 ENCOUNTER — Ambulatory Visit: Payer: Managed Care, Other (non HMO) | Attending: Family

## 2019-04-08 DIAGNOSIS — Z23 Encounter for immunization: Secondary | ICD-10-CM | POA: Insufficient documentation

## 2019-04-08 NOTE — Progress Notes (Signed)
   Covid-19 Vaccination Clinic  Name:  Peter Keller    MRN: [id number] DOB: 09-Mar-1975  04/08/2019  [name] was observed post Covid-19 immunization for 15 minutes without incidence. He was provided with Vaccine Information Sheet and instruction to access the V-Safe system.   [name] was instructed to call 911 with any severe reactions post vaccine: Marland Kitchen Difficulty breathing  . Swelling of your face and throat  . A fast heartbeat  . A bad rash all over your body  . Dizziness and weakness    Immunizations Administered    Name Date Dose VIS Date Route   Moderna COVID-19 Vaccine 04/08/2019  9:32 AM 0.5 mL 01/15/2019 Intramuscular   Manufacturer: Moderna   Lot: [id number]   NDC: 00415-930-12

## 2019-04-29 ENCOUNTER — Other Ambulatory Visit: Payer: Self-pay

## 2019-04-29 ENCOUNTER — Other Ambulatory Visit: Payer: Self-pay | Admitting: Internal Medicine

## 2019-04-29 ENCOUNTER — Encounter: Payer: Self-pay | Admitting: Internal Medicine

## 2019-05-04 MED ORDER — LOSARTAN POTASSIUM-HCTZ 100-25 MG PO TABS: 1.0000 | ORAL_TABLET | Freq: Every day | ORAL | 0 refills | Status: DC

## 2019-05-07 ENCOUNTER — Ambulatory Visit: Payer: Self-pay | Attending: Family

## 2019-05-07 DIAGNOSIS — Z23 Encounter for immunization: Secondary | ICD-10-CM

## 2019-05-07 NOTE — Progress Notes (Signed)
   Covid-19 Vaccination Clinic  Name:  Peter Keller    MRN: [id number] DOB: 03/31/1975  05/07/2019  Peter Keller was observed post Covid-19 immunization for 15 minutes without incident. He was provided with Vaccine Information Sheet and instruction to access the V-Safe system.   Peter Keller was instructed to call 911 with any severe reactions post vaccine: Marland Kitchen Difficulty breathing  . Swelling of face and throat  . A fast heartbeat  . A bad rash all over body  . Dizziness and weakness   Immunizations Administered    Name Date Dose VIS Date Route   Moderna COVID-19 Vaccine 05/07/2019  9:05 AM 0.5 mL 01/15/2019 Intramuscular   Manufacturer: Moderna   Lot: [id number]   NDC: 09643-838-18

## 2019-05-28 ENCOUNTER — Other Ambulatory Visit: Payer: Self-pay

## 2019-05-28 ENCOUNTER — Ambulatory Visit (INDEPENDENT_AMBULATORY_CARE_PROVIDER_SITE_OTHER): Payer: 59 | Admitting: Internal Medicine

## 2019-05-28 ENCOUNTER — Encounter: Payer: Self-pay | Admitting: Internal Medicine

## 2019-05-28 VITALS — BP 142/98 | HR 100 | Temp 98.3°F | Ht 69.0 in | Wt 211.6 lb

## 2019-05-28 DIAGNOSIS — I10 Essential (primary) hypertension: Secondary | ICD-10-CM | POA: Diagnosis not present

## 2019-05-28 DIAGNOSIS — F1020 Alcohol dependence, uncomplicated: Secondary | ICD-10-CM

## 2019-05-28 DIAGNOSIS — Z0000 Encounter for general adult medical examination without abnormal findings: Secondary | ICD-10-CM | POA: Diagnosis not present

## 2019-05-28 DIAGNOSIS — E785 Hyperlipidemia, unspecified: Secondary | ICD-10-CM

## 2019-05-28 DIAGNOSIS — K760 Fatty (change of) liver, not elsewhere classified: Secondary | ICD-10-CM

## 2019-05-28 DIAGNOSIS — E1169 Type 2 diabetes mellitus with other specified complication: Secondary | ICD-10-CM | POA: Diagnosis not present

## 2019-05-28 LAB — LIPID PANEL
Cholesterol: 210 mg/dL — ABNORMAL HIGH (ref 0–200)
HDL: 39.3 mg/dL (ref >39.00)
NonHDL: 170.44
Total CHOL/HDL Ratio: 5
Triglycerides: 290 mg/dL — ABNORMAL HIGH (ref 0.0–149.0)
VLDL: 58 mg/dL — ABNORMAL HIGH (ref 0.0–40.0)

## 2019-05-28 LAB — COMPREHENSIVE METABOLIC PANEL
ALT: 107 U/L — ABNORMAL HIGH (ref 0–53)
AST: 173 U/L — ABNORMAL HIGH (ref 0–37)
Albumin: 4.6 g/dL (ref 3.5–5.2)
Alkaline Phosphatase: 57 U/L (ref 39–117)
BUN: 10 mg/dL (ref 6–23)
CO2: 24 mEq/L (ref 19–32)
Calcium: 9.8 mg/dL (ref 8.4–10.5)
Chloride: 102 mEq/L (ref 96–112)
Creatinine, Ser: 0.79 mg/dL (ref 0.40–1.50)
GFR: 106.74 mL/min (ref >60.00)
Glucose, Bld: 182 mg/dL — ABNORMAL HIGH (ref 70–99)
Potassium: 3.9 mEq/L (ref 3.5–5.1)
Sodium: 137 mEq/L (ref 135–145)
Total Bilirubin: 0.9 mg/dL (ref 0.2–1.2)
Total Protein: 7.9 g/dL (ref 6.0–8.3)

## 2019-05-28 LAB — CBC
HCT: 50 % (ref 39.0–52.0)
Hemoglobin: 17.8 g/dL — ABNORMAL HIGH (ref 13.0–17.0)
MCHC: 35.6 g/dL (ref 30.0–36.0)
MCV: 89.9 fl (ref 78.0–100.0)
Platelets: 192 10*3/uL (ref 150.0–400.0)
RBC: 5.57 Mil/uL (ref 4.22–5.81)
RDW: 12.5 % (ref 11.5–15.5)
WBC: 5.5 10*3/uL (ref 4.0–10.5)

## 2019-05-28 LAB — LDL CHOLESTEROL, DIRECT: Direct LDL: 133 mg/dL

## 2019-05-28 LAB — MICROALBUMIN / CREATININE URINE RATIO
Creatinine,U: 122.3 mg/dL
Microalb Creat Ratio: 1.4 mg/g (ref 0.0–30.0)
Microalb, Ur: 1.7 mg/dL (ref 0.0–1.9)

## 2019-05-28 LAB — HEMOGLOBIN A1C: Hgb A1c MFr Bld: 7.4 % — ABNORMAL HIGH (ref 4.6–6.5)

## 2019-05-28 NOTE — Patient Instructions (Signed)
If the blood pressure is high we will increase the bystolic to 2 pills in the morning.   Let us know how this is doing.  I would consider to give up alcohol altogether as there are some health benefits from not drinking alcohol.   Health Maintenance, Male Adopting a healthy lifestyle and getting preventive care are important in promoting health and wellness. Ask your health care provider about:  The right schedule for you to have regular tests and exams.  Things you can do on your own to prevent diseases and keep yourself healthy. What should I know about diet, weight, and exercise? Eat a healthy diet   Eat a diet that includes plenty of vegetables, fruits, low-fat dairy products, and lean protein.  Do not eat a lot of foods that are high in solid fats, added sugars, or sodium. Maintain a healthy weight Body mass index (BMI) is a measurement that can be used to identify possible weight problems. It estimates body fat based on height and weight. Your health care provider can help determine your BMI and help you achieve or maintain a healthy weight. Get regular exercise Get regular exercise. This is one of the most important things you can do for your health. Most adults should:  Exercise for at least 150 minutes each week. The exercise should increase your heart rate and make you sweat (moderate-intensity exercise).  Do strengthening exercises at least twice a week. This is in addition to the moderate-intensity exercise.  Spend less time sitting. Even light physical activity can be beneficial. Watch cholesterol and blood lipids Have your blood tested for lipids and cholesterol at 44 years of age, then have this test every 5 years. You may need to have your cholesterol levels checked more often if:  Your lipid or cholesterol levels are high.  You are older than 44 years of age.  You are at high risk for heart disease. What should I know about cancer screening? Many types of  cancers can be detected early and may often be prevented. Depending on your health history and family history, you may need to have cancer screening at various ages. This may include screening for:  Colorectal cancer.  Prostate cancer.  Skin cancer.  Lung cancer. What should I know about heart disease, diabetes, and high blood pressure? Blood pressure and heart disease  High blood pressure causes heart disease and increases the risk of stroke. This is more likely to develop in people who have high blood pressure readings, are of African descent, or are overweight.  Talk with your health care provider about your target blood pressure readings.  Have your blood pressure checked: ? Every 3-5 years if you are 44-44 years of age. ? Every year if you are 44 years old or older.  If you are between the ages of 80 and 22 and are a current or former smoker, ask your health care provider if you should have a one-time screening for abdominal aortic aneurysm (AAA). Diabetes Have regular diabetes screenings. This checks your fasting blood sugar level. Have the screening done:  Once every three years after age 61 if you are at a normal weight and have a low risk for diabetes.  More often and at a younger age if you are overweight or have a high risk for diabetes. What should I know about preventing infection? Hepatitis B If you have a higher risk for hepatitis B, you should be screened for this virus. Talk with your health care  provider to find out if you are at risk for hepatitis B infection. Hepatitis C Blood testing is recommended for:  Everyone born from [birth year] through 1965.  Anyone with known risk factors for hepatitis C. Sexually transmitted infections (STIs)  You should be screened each year for STIs, including gonorrhea and chlamydia, if: ? You are sexually active and are younger than 44 years of age. ? You are older than 44 years of age and your health care provider tells you that you  are at risk for this type of infection. ? Your sexual activity has changed since you were last screened, and you are at increased risk for chlamydia or gonorrhea. Ask your health care provider if you are at risk.  Ask your health care provider about whether you are at high risk for HIV. Your health care provider may recommend a prescription medicine to help prevent HIV infection. If you choose to take medicine to prevent HIV, you should first get tested for HIV. You should then be tested every 3 months for as long as you are taking the medicine. Follow these instructions at home: Lifestyle  Do not use any products that contain nicotine or tobacco, such as cigarettes, e-cigarettes, and chewing tobacco. If you need help quitting, ask your health care provider.  Do not use street drugs.  Do not share needles.  Ask your health care provider for help if you need support or information about quitting drugs. Alcohol use  Do not drink alcohol if your health care provider tells you not to drink.  If you drink alcohol: ? Limit how much you have to 0-2 drinks a day. ? Be aware of how much alcohol is in your drink. In the [place] one drink equals one 12 oz bottle of beer (355 mL), one 5 oz glass of wine (148 mL), or one 1 oz glass of hard liquor (44 mL). General instructions  Schedule regular health, dental, and eye exams.  Stay current with your vaccines.  Tell your health care provider if: ? You often feel depressed. ? You have ever been abused or do not feel safe at home. Summary  Adopting a healthy lifestyle and getting preventive care are important in promoting health and wellness.  Follow your health care provider's instructions about healthy diet, exercising, and getting tested or screened for diseases.  Follow your health care provider's instructions on monitoring your cholesterol and blood pressure. This information is not intended to replace advice given to you by your health care  provider. Make sure you discuss any questions you have with your health care provider. Document Revised: 01/24/2018 Document Reviewed: 01/24/2018 Elsevier Patient Education  2020 [employer].

## 2019-05-28 NOTE — Assessment & Plan Note (Signed)
Checking LFTs and encouraged to stop alcohol lifelong.

## 2019-05-28 NOTE — Assessment & Plan Note (Signed)
He is drinking in moderate amounts right now and admits to not feeling as well as when not drinking. Advised to stop alcohol altogether as there is no health benefit and he has fatty liver with elevations and diabetes and other health complications related to his alcohol usage.

## 2019-05-28 NOTE — Assessment & Plan Note (Signed)
BP elevated today, possibly related to alcohol increase. Will keep losartan/hctz 100/25 and increase bystolic to 10 mg daily. He will monitor readings and let us know if this helps. Checking CMP and adjust as needed.

## 2019-05-28 NOTE — Assessment & Plan Note (Signed)
Checking lipid panel and adjust lipitor and fenofibrate as needed.

## 2019-05-28 NOTE — Assessment & Plan Note (Signed)
Foot exam done, reminded about eye exam. Checking HgA1c and microalbumin to creatinine ratio. Seeing endo but no recent labs. Taking metformin, trulicity, invokana.

## 2019-05-28 NOTE — Assessment & Plan Note (Signed)
Flu shot up to date. Pneumonia up to date. Tetanus up to date. Covid-19 complete. Counseled about sun safety and mole surveillance. Counseled about the dangers of distracted driving. Given 10 year screening recommendations.

## 2019-05-28 NOTE — Progress Notes (Signed)
   Subjective:   Patient ID: Peter Keller, male    DOB: March 31, 1975, 44 y.o.   MRN: [id number]  HPI The patient is a 44 YO man coming in for physical. Admits to drinking in moderate amounts currently and feels it is likely too much and admits to feeling better when not drinking.   PMH, [facility], social history reviewed and updated  Review of Systems  Constitutional: Negative.   HENT: Negative.   Eyes: Negative.   Respiratory: Negative for cough, chest tightness and shortness of breath.   Cardiovascular: Negative for chest pain, palpitations and leg swelling.  Gastrointestinal: Negative for abdominal distention, abdominal pain, constipation, diarrhea, nausea and vomiting.  Musculoskeletal: Negative.   Skin: Negative.   Neurological: Negative.   Psychiatric/Behavioral: Negative.     Objective:  Physical Exam Constitutional:      Appearance: He is well-developed.  HENT:     Head: Normocephalic and atraumatic.  Cardiovascular:     Rate and Rhythm: Normal rate and regular rhythm.  Pulmonary:     Effort: Pulmonary effort is normal. No respiratory distress.     Breath sounds: Normal breath sounds. No wheezing or rales.  Abdominal:     General: Bowel sounds are normal. There is no distension.     Palpations: Abdomen is soft.     Tenderness: There is no abdominal tenderness. There is no rebound.  Musculoskeletal:     Cervical back: Normal range of motion.  Skin:    General: Skin is warm and dry.     Comments: Foot exam done at visit  Neurological:     Mental Status: He is alert and oriented to person, place, and time.     Coordination: Coordination normal.     Vitals:   05/28/19 0910 05/28/19 0948  BP: (!) 180/116 (!) 142/98  Pulse: 100   Temp: 98.3 F (36.8 C)   TempSrc: Oral   SpO2: 95%   Weight: 211 lb 9.6 oz (96 kg)   Height: 5\' 9"  (1.753 m)     This visit occurred during the SARS-CoV-2 public health emergency.  Safety protocols were in place, including screening  questions prior to the visit, additional usage of staff PPE, and extensive cleaning of exam room while observing appropriate contact time as indicated for disinfecting solutions.   Assessment & Plan:

## 2019-06-30 ENCOUNTER — Other Ambulatory Visit: Payer: Self-pay | Admitting: Internal Medicine

## 2019-11-27 ENCOUNTER — Other Ambulatory Visit: Payer: Self-pay | Admitting: Internal Medicine

## 2019-12-27 ENCOUNTER — Other Ambulatory Visit: Payer: Self-pay | Admitting: Internal Medicine

## 2020-01-05 ENCOUNTER — Other Ambulatory Visit: Payer: Self-pay | Admitting: Internal Medicine

## 2020-02-01 ENCOUNTER — Other Ambulatory Visit: Payer: Self-pay | Admitting: Internal Medicine

## 2020-02-10 ENCOUNTER — Telehealth: Payer: Self-pay | Admitting: Internal Medicine

## 2020-02-10 MED ORDER — FENOFIBRATE 145 MG PO TABS
145.0000 mg | ORAL_TABLET | Freq: Every day | ORAL | 3 refills | Status: DC
Start: 2020-02-10 — End: 2020-03-06

## 2020-02-10 NOTE — Telephone Encounter (Signed)
Refill sent. See meds.  

## 2020-02-10 NOTE — Telephone Encounter (Signed)
1.Medication Requested: fenofibrate (TRICOR) 145 MG tablet  2. Pharmacy (Name, Street, [place]):  [facility] [address], [place] - [address] Phone:  [phone]  Fax:  [phone]      3. On Med List: Y  4. Last Visit with PCP: 4.13.21  5. Next visit date with PCP: 1.13.22   Agent: Please be advised that RX refills may take up to 3 business days. We ask that you follow-up with your pharmacy.

## 2020-02-13 ENCOUNTER — Ambulatory Visit: Payer: 59 | Admitting: Internal Medicine

## 2020-02-27 ENCOUNTER — Encounter: Payer: 59 | Admitting: Internal Medicine

## 2020-03-06 ENCOUNTER — Other Ambulatory Visit: Payer: Self-pay

## 2020-03-06 ENCOUNTER — Ambulatory Visit (INDEPENDENT_AMBULATORY_CARE_PROVIDER_SITE_OTHER): Payer: 59 | Admitting: Internal Medicine

## 2020-03-06 ENCOUNTER — Encounter: Payer: Self-pay | Admitting: Internal Medicine

## 2020-03-06 VITALS — BP 132/80 | HR 97 | Temp 98.0°F | Resp 18 | Ht 69.0 in | Wt 203.4 lb

## 2020-03-06 DIAGNOSIS — E785 Hyperlipidemia, unspecified: Secondary | ICD-10-CM | POA: Diagnosis not present

## 2020-03-06 DIAGNOSIS — E1169 Type 2 diabetes mellitus with other specified complication: Secondary | ICD-10-CM | POA: Diagnosis not present

## 2020-03-06 DIAGNOSIS — F1020 Alcohol dependence, uncomplicated: Secondary | ICD-10-CM

## 2020-03-06 LAB — COMPREHENSIVE METABOLIC PANEL
ALT: 35 U/L (ref 0–53)
AST: 44 U/L — ABNORMAL HIGH (ref 0–37)
Albumin: 4.7 g/dL (ref 3.5–5.2)
Alkaline Phosphatase: 40 U/L (ref 39–117)
BUN: 19 mg/dL (ref 6–23)
CO2: 28 mEq/L (ref 19–32)
Calcium: 9.8 mg/dL (ref 8.4–10.5)
Chloride: 101 mEq/L (ref 96–112)
Creatinine, Ser: 0.86 mg/dL (ref 0.40–1.50)
GFR: 105.4 mL/min (ref >60.00)
Glucose, Bld: 136 mg/dL — ABNORMAL HIGH (ref 70–99)
Potassium: 3.6 mEq/L (ref 3.5–5.1)
Sodium: 137 mEq/L (ref 135–145)
Total Bilirubin: 0.7 mg/dL (ref 0.2–1.2)
Total Protein: 7.8 g/dL (ref 6.0–8.3)

## 2020-03-06 LAB — TSH: TSH: 1.49 u[IU]/mL (ref 0.35–4.50)

## 2020-03-06 LAB — CBC WITH DIFFERENTIAL/PLATELET
Basophils Absolute: 0 10*3/uL (ref 0.0–0.1)
Basophils Relative: 0.6 % (ref 0.0–3.0)
Eosinophils Absolute: 0.2 10*3/uL (ref 0.0–0.7)
Eosinophils Relative: 3.1 % (ref 0.0–5.0)
HCT: 45.6 % (ref 39.0–52.0)
Hemoglobin: 16.3 g/dL (ref 13.0–17.0)
Lymphocytes Relative: 43 % (ref 12.0–46.0)
Lymphs Abs: 2.6 10*3/uL (ref 0.7–4.0)
MCHC: 35.7 g/dL (ref 30.0–36.0)
MCV: 89.3 fl (ref 78.0–100.0)
Monocytes Absolute: 0.7 10*3/uL (ref 0.1–1.0)
Monocytes Relative: 12.1 % — ABNORMAL HIGH (ref 3.0–12.0)
Neutro Abs: 2.5 10*3/uL (ref 1.4–7.7)
Neutrophils Relative %: 41.2 % — ABNORMAL LOW (ref 43.0–77.0)
Platelets: 179 10*3/uL (ref 150.0–400.0)
RBC: 5.11 Mil/uL (ref 4.22–5.81)
RDW: 12.1 % (ref 11.5–15.5)
WBC: 6 10*3/uL (ref 4.0–10.5)

## 2020-03-06 LAB — LDL CHOLESTEROL, DIRECT: Direct LDL: 83 mg/dL

## 2020-03-06 LAB — LIPID PANEL
Cholesterol: 140 mg/dL (ref 0–200)
HDL: 33 mg/dL — ABNORMAL LOW (ref >39.00)
NonHDL: 107.26
Total CHOL/HDL Ratio: 4
Triglycerides: 304 mg/dL — ABNORMAL HIGH (ref 0.0–149.0)
VLDL: 60.8 mg/dL — ABNORMAL HIGH (ref 0.0–40.0)

## 2020-03-06 LAB — HEMOGLOBIN A1C: Hgb A1c MFr Bld: 6.1 % (ref 4.6–6.5)

## 2020-03-06 MED ORDER — FENOFIBRATE 145 MG PO TABS
145.0000 mg | ORAL_TABLET | Freq: Every day | ORAL | 3 refills | Status: DC
Start: 2020-03-06 — End: 2020-07-25

## 2020-03-06 MED ORDER — TRULICITY 3 MG/0.5ML ~~LOC~~ SOAJ
3.0000 mg | SUBCUTANEOUS | 3 refills | Status: DC
Start: 2020-03-06 — End: 2021-01-14

## 2020-03-06 MED ORDER — METFORMIN HCL 1000 MG PO TABS
1000.0000 mg | ORAL_TABLET | Freq: Two times a day (BID) | ORAL | 3 refills | Status: DC
Start: 2020-03-06 — End: 2021-01-14

## 2020-03-06 MED ORDER — ATORVASTATIN CALCIUM 20 MG PO TABS
20.0000 mg | ORAL_TABLET | Freq: Every day | ORAL | 3 refills | Status: DC
Start: 2020-03-06 — End: 2020-07-25

## 2020-03-06 MED ORDER — LOSARTAN POTASSIUM-HCTZ 100-25 MG PO TABS: 1.0000 | ORAL_TABLET | Freq: Every day | ORAL | 3 refills | Status: DC

## 2020-03-06 MED ORDER — NEBIVOLOL HCL 5 MG PO TABS
10.0000 mg | ORAL_TABLET | Freq: Every day | ORAL | 3 refills | Status: DC
Start: 2020-03-06 — End: 2020-07-25

## 2020-03-06 NOTE — Assessment & Plan Note (Signed)
Refilled medications and checking HgA1c. Adjust as needed. Taking metformin and trulicity.

## 2020-03-06 NOTE — Patient Instructions (Addendum)
Let us know which mail order you would like to use.   Let us know the fax number to send the labs.

## 2020-03-06 NOTE — Assessment & Plan Note (Signed)
Has had increase in alcohol since last visit and then did stop for about 3 weeks. He is now back to drinking again and above recommended 14 drinks maximum per week for men. We talked extensively today about how some with problems with alcohol can resume normal drinking without problems and how others need to avoid alcohol lifelong to stay at safe levels. We are checking CMP today and if LFT levels are still elevated I would recommend to avoid alcohol lifelong as he has had previous changes of fatty liver on imaging and alcohol makes cirrhosis more likely.

## 2020-03-06 NOTE — Progress Notes (Signed)
   Subjective:   Patient ID: Peter Keller, male    DOB: Oct 02, 1975, 45 y.o.   MRN: [id number]  HPI The patient is a 45 YO man coming in for follow up alcohol usage (has increased usage since our last visit and then feels he hit rock bottom, did take about 3-4 weeks without drinking alcohol, then went on vacation and was drinking some alcohol there, now has drank about 2-3 nights this week, is trying to drink less in a night, 4 drinks per night instead of more, is interested in aa and has his wife working on finding this for him, denies problems with quitting when he was off alcohol) and diabetes (doing well on medications, when not drinking alcohol and changing diet he had to stop a few medicines due to lower readings, is now back on all his medications) and cholesterol (taking lipitor, denies chest pains or stroke symptoms, diet stable).   Review of Systems  Constitutional: Negative.   HENT: Negative.   Eyes: Negative.   Respiratory: Negative for cough, chest tightness and shortness of breath.   Cardiovascular: Negative for chest pain, palpitations and leg swelling.  Gastrointestinal: Negative for abdominal distention, abdominal pain, constipation, diarrhea, nausea and vomiting.  Musculoskeletal: Negative.   Skin: Negative.   Neurological: Negative.   Psychiatric/Behavioral: Negative.     Objective:  Physical Exam Constitutional:      Appearance: He is well-developed and well-nourished.  HENT:     Head: Normocephalic and atraumatic.  Eyes:     Extraocular Movements: EOM normal.  Cardiovascular:     Rate and Rhythm: Normal rate and regular rhythm.  Pulmonary:     Effort: Pulmonary effort is normal. No respiratory distress.     Breath sounds: Normal breath sounds. No wheezing or rales.  Abdominal:     General: Bowel sounds are normal. There is no distension.     Palpations: Abdomen is soft.     Tenderness: There is no abdominal tenderness. There is no rebound.  Musculoskeletal:         General: No edema.     Cervical back: Normal range of motion.  Skin:    General: Skin is warm and dry.  Neurological:     Mental Status: He is alert and oriented to person, place, and time.     Coordination: Coordination normal.  Psychiatric:        Mood and Affect: Mood and affect normal.     Vitals:   03/06/20 1336  BP: 132/80  Pulse: 97  Resp: 18  Temp: 98 F (36.7 C)  TempSrc: Oral  SpO2: 97%  Weight: 203 lb 6.4 oz (92.3 kg)  Height: 5\' 9"  (1.753 m)    This visit occurred during the SARS-CoV-2 public health emergency.  Safety protocols were in place, including screening questions prior to the visit, additional usage of staff PPE, and extensive cleaning of exam room while observing appropriate contact time as indicated for disinfecting solutions.   Assessment & Plan:  Visit time 30 minutes in face to face communication with patient and coordination of care, additional 10 minutes spent in record review, coordination or care, ordering tests, communicating/referring to other healthcare professionals, documenting in medical records all on the same day of the visit for total time 40 minutes spent on the visit.

## 2020-03-06 NOTE — Assessment & Plan Note (Signed)
Checking lipid panel and adjust fenofibrate and lipitor as needed.

## 2020-03-11 ENCOUNTER — Encounter: Payer: Self-pay | Admitting: Internal Medicine

## 2020-04-16 ENCOUNTER — Other Ambulatory Visit: Payer: Self-pay | Admitting: Internal Medicine

## 2020-04-21 ENCOUNTER — Encounter: Payer: Self-pay | Admitting: Internal Medicine

## 2020-04-21 ENCOUNTER — Other Ambulatory Visit: Payer: Self-pay

## 2020-04-21 MED ORDER — BUPROPION HCL ER (XL) 150 MG PO TB24: 150.0000 mg | ORAL_TABLET | Freq: Every day | ORAL | 3 refills | Status: DC

## 2020-06-05 ENCOUNTER — Encounter: Payer: Self-pay | Admitting: Internal Medicine

## 2020-06-05 ENCOUNTER — Other Ambulatory Visit: Payer: Self-pay

## 2020-06-05 ENCOUNTER — Ambulatory Visit (INDEPENDENT_AMBULATORY_CARE_PROVIDER_SITE_OTHER): Payer: 59 | Admitting: Internal Medicine

## 2020-06-05 VITALS — BP 130/80 | HR 91 | Ht 69.0 in | Wt 216.2 lb

## 2020-06-05 DIAGNOSIS — R072 Precordial pain: Secondary | ICD-10-CM | POA: Diagnosis not present

## 2020-06-05 DIAGNOSIS — R079 Chest pain, unspecified: Secondary | ICD-10-CM

## 2020-06-05 DIAGNOSIS — I10 Essential (primary) hypertension: Secondary | ICD-10-CM | POA: Diagnosis not present

## 2020-06-05 DIAGNOSIS — E785 Hyperlipidemia, unspecified: Secondary | ICD-10-CM

## 2020-06-05 DIAGNOSIS — E1169 Type 2 diabetes mellitus with other specified complication: Secondary | ICD-10-CM | POA: Diagnosis not present

## 2020-06-05 LAB — BASIC METABOLIC PANEL
BUN/Creatinine Ratio: 15 (ref 9–20)
BUN: 13 mg/dL (ref 6–24)
CO2: 21 mmol/L (ref 20–29)
Calcium: 9.9 mg/dL (ref 8.7–10.2)
Chloride: 97 mmol/L (ref 96–106)
Creatinine, Ser: 0.86 mg/dL (ref 0.76–1.27)
Glucose: 193 mg/dL — ABNORMAL HIGH (ref 65–99)
Potassium: 4.2 mmol/L (ref 3.5–5.2)
Sodium: 140 mmol/L (ref 134–144)
eGFR: 109 mL/min/{1.73_m2} (ref >59)

## 2020-06-05 MED ORDER — METOPROLOL TARTRATE 100 MG PO TABS: ORAL_TABLET | ORAL | 0 refills | Status: DC

## 2020-06-05 NOTE — Patient Instructions (Addendum)
Medication Instructions:  Your physician recommends that you continue on your current medications as directed. Please refer to the Current Medication list given to you today.  Labwork: You will get lab work today:  BMP  Testing/Procedures: Your physician has requested that you have cardiac CT. Cardiac computed tomography (CT) is a painless test that uses an x-ray machine to take clear, detailed pictures of your heart.    Follow-Up: Your physician wants you to follow-up in: 3 months with Lewayne Bunting, MD   Any Other Special Instructions Will Be Listed Below (If Applicable).  If you need a refill on your cardiac medications before your next appointment, please call your pharmacy.

## 2020-06-05 NOTE — Progress Notes (Signed)
HPI Peter Keller returns today after a 3 year absence for evaluation of chest pain. The patient has a h/o DM, HTN, and obesity. When I saw him last 3 years ago, he weighed 224 and is down 8 lbs. He had lost more but gained it back during Covid. The patient admits to excess ETOH use and dietary indiscretion. He has not had syncope. He has atypical chest pain though he does get some discomfort in his chest when he walks. No radiation. He had an echo 3 years ago which showed normal systololic function. He is sedentary.  No Known Allergies   Current Outpatient Medications  Medication Sig Dispense Refill  . atorvastatin (LIPITOR) 20 MG tablet Take 1 tablet (20 mg total) by mouth daily. 90 tablet 3  . Blood Glucose Monitoring Suppl (ONETOUCH VERIO IQ SYSTEM) w/Device KIT Use as directed 1 kit 0  . buPROPion (WELLBUTRIN XL) 150 MG 24 hr tablet Take 1 tablet (150 mg total) by mouth daily. 90 tablet 3  . Cholecalciferol (VITAMIN D) 400 UNITS capsule Take 500 Units by mouth daily.    . Continuous Blood Gluc Receiver (FREESTYLE LIBRE 14 DAY READER) DEVI USE AS DIRECTED BY YOUR DOCTOR FOR 1 DOSE 1 each 0  . Continuous Blood Gluc Sensor (FREESTYLE LIBRE 14 DAY SENSOR) MISC USE EVERY 14 DAYS AND CHANGE EVERY 2 WEEKS 2 each 0  . Dulaglutide (TRULICITY) 3 EH/2.0NO SOPN Inject 3 mg into the skin once a week. 12 mL 3  . esomeprazole (NEXIUM) 20 MG capsule Take 20 mg by mouth daily at 12 noon.    . fenofibrate (TRICOR) 145 MG tablet Take 1 tablet (145 mg total) by mouth daily. 90 tablet 3  . glucose blood (ONETOUCH VERIO) test strip USE AS INSTRUCTED TO CHECK BLOOD SUGAR 1x DAILY 100 each 3  . losartan-hydrochlorothiazide (HYZAAR) 100-25 MG tablet Take 1 tablet by mouth daily. 90 tablet 3  . metFORMIN (GLUCOPHAGE) 1000 MG tablet Take 1 tablet (1,000 mg total) by mouth 2 (two) times daily with a meal. 180 tablet 3  . metoprolol tartrate (LOPRESSOR) 100 MG tablet Take one tablet by mouth 2 hours prior to cardiac  CT 1 tablet 0  . nebivolol (BYSTOLIC) 5 MG tablet Take 2 tablets (10 mg total) by mouth daily. 180 tablet 3  . nystatin-triamcinolone ointment (MYCOLOG) Apply 1 application topically 2 (two) times daily. 100 g 3  . ONETOUCH DELICA LANCETS 70J MISC USE TO OBTAIN A BLOOD SPECIMEN ONCE PER DAY 100 each 0  . JARDIANCE 25 MG TABS tablet Take 25 mg by mouth daily.     No current facility-administered medications for this visit.     Past Medical History:  Diagnosis Date  . Alcohol dependence ([place])   . Diabetes ([place])   . GERD (gastroesophageal reflux disease)   . Hepatic steatosis   . Hyperlipidemia   . Hypertension   . Obesity   . Sinus tachycardia     ROS:   All systems reviewed and negative except as noted in the HPI.   Past Surgical History:  Procedure Laterality Date  . none       Family History  Problem Relation Age of Onset  . Hypertension Mother   . Diabetes Mother   . Heart disease Paternal Uncle   . Heart attack Paternal Grandmother   . Colon cancer Neg Hx   . Esophageal cancer Neg Hx   . Stomach cancer Neg Hx  Social History   Socioeconomic History  . Marital status: Married    Spouse name: Not on file  . Number of children: Not on file  . Years of education: Not on file  . Highest education level: Not on file  Occupational History  . Not on file  Tobacco Use  . Smoking status: Never Smoker  . Smokeless tobacco: Never Used  Substance and Sexual Activity  . Alcohol use: Yes    Comment: 3 alcoholic drinks daily  . Drug use: No  . Sexual activity: Not on file  Other Topics Concern  . Not on file  Social History Narrative  . Not on file   Social Determinants of Health   Financial Resource Strain: Not on file  Food Insecurity: Not on file  Transportation Needs: Not on file  Physical Activity: Not on file  Stress: Not on file  Social Connections: Not on file  Intimate Partner Violence: Not on file     BP 130/80 (BP Location: Left Arm,  Patient Position: Sitting, Cuff Size: Normal)   Pulse 91   Ht $R'5\' 9"'kl$  (1.753 m)   Wt 216 lb 3.2 oz (98.1 kg)   SpO2 96%   BMI 31.93 kg/m   Physical Exam:  Well appearing 45 yo man, NAD HEENT: Unremarkable Neck:  No JVD, no thyromegally Lymphatics:  No adenopathy Back:  No CVA tenderness Lungs:  Clear with no wheezes HEART:  Regular rate rhythm, no murmurs, no rubs, no clicks Abd:  soft, positive bowel sounds, no organomegally, no rebound, no guarding Ext:  2 plus pulses, no edema, no cyanosis, no clubbing Skin:  No rashes no nodules Neuro:  CN II through XII intact, motor grossly intact  EKG - nsr  Assess/Plan: 1. Atypical chest pain - he has multiple cardiac risk factors and pain which has typical and atypical features. I have recommended he undergo coronary CT evaluation. Additional treatment will depend on the result of his CT scan. 2. Obesity - I encouraged him to lose weight. 3. ETOH exuberance - I have encouraged him to lose weight. 4. DM - I asked him to reduce his sweets and ETOH use and to lose weight.   Peter Overlie Halston [place],MD

## 2020-07-16 ENCOUNTER — Telehealth: Payer: Self-pay

## 2020-07-16 NOTE — Telephone Encounter (Signed)
-----   Message from Lorrin Jackson sent at 07/16/2020  8:02 AM EDT ----- Regarding: labs Good Morning,  He is scheduled for 07/23/20 at 8:00 and will need labs.  Thanks, [place]

## 2020-07-20 ENCOUNTER — Other Ambulatory Visit: Payer: Self-pay

## 2020-07-20 ENCOUNTER — Other Ambulatory Visit: Payer: Self-pay | Admitting: *Deleted

## 2020-07-20 ENCOUNTER — Other Ambulatory Visit: Payer: 59 | Admitting: *Deleted

## 2020-07-20 DIAGNOSIS — Z01812 Encounter for preprocedural laboratory examination: Secondary | ICD-10-CM

## 2020-07-20 DIAGNOSIS — R079 Chest pain, unspecified: Secondary | ICD-10-CM

## 2020-07-21 LAB — BASIC METABOLIC PANEL
BUN/Creatinine Ratio: 16 (ref 9–20)
BUN: 14 mg/dL (ref 6–24)
CO2: 19 mmol/L — ABNORMAL LOW (ref 20–29)
Calcium: 9.3 mg/dL (ref 8.7–10.2)
Chloride: 104 mmol/L (ref 96–106)
Creatinine, Ser: 0.9 mg/dL (ref 0.76–1.27)
Glucose: 140 mg/dL — ABNORMAL HIGH (ref 65–99)
Potassium: 4 mmol/L (ref 3.5–5.2)
Sodium: 144 mmol/L (ref 134–144)
eGFR: 108 mL/min/{1.73_m2} (ref >59)

## 2020-07-22 ENCOUNTER — Telehealth (HOSPITAL_COMMUNITY): Payer: Self-pay | Admitting: Emergency Medicine

## 2020-07-22 NOTE — Telephone Encounter (Signed)
Attempted to call patient regarding upcoming cardiac CT appointment. °Left message on voicemail with name and callback number °Yurem Viner RN Navigator Cardiac Imaging °[facility] °[phone] Office °[phone] Cell ° °

## 2020-07-23 ENCOUNTER — Ambulatory Visit (HOSPITAL_COMMUNITY)
Admission: RE | Admit: 2020-07-23 | Discharge: 2020-07-23 | Disposition: A | Discharge status: Home / Self Care | Payer: 59 | Source: Ambulatory Visit | Attending: Internal Medicine | Admitting: Internal Medicine

## 2020-07-23 ENCOUNTER — Other Ambulatory Visit: Payer: Self-pay

## 2020-07-23 DIAGNOSIS — R072 Precordial pain: Secondary | ICD-10-CM | POA: Insufficient documentation

## 2020-07-23 DIAGNOSIS — I2510 Atherosclerotic heart disease of native coronary artery without angina pectoris: Secondary | ICD-10-CM | POA: Diagnosis not present

## 2020-07-23 MED ORDER — METOPROLOL TARTRATE 5 MG/5ML IV SOLN
INTRAVENOUS | Status: AC
  Filled 2020-07-23: qty 10

## 2020-07-23 MED ORDER — NITROGLYCERIN 0.4 MG SL SUBL
0.8000 mg | SUBLINGUAL_TABLET | Freq: Once | SUBLINGUAL | Status: AC
  Administered 2020-07-23: 0.8 mg via SUBLINGUAL

## 2020-07-23 MED ORDER — SODIUM CHLORIDE 0.9 % IV BOLUS
250.0000 mL | Freq: Once | INTRAVENOUS | Status: AC
  Administered 2020-07-23: 250 mL via INTRAVENOUS

## 2020-07-23 MED ORDER — DILTIAZEM HCL 25 MG/5ML IV SOLN
INTRAVENOUS | Status: AC
  Filled 2020-07-23: qty 5

## 2020-07-23 MED ORDER — IOHEXOL 350 MG/ML SOLN
95.0000 mL | Freq: Once | INTRAVENOUS | Status: AC | PRN
  Administered 2020-07-23: 95 mL via INTRAVENOUS

## 2020-07-23 MED ORDER — METOPROLOL TARTRATE 5 MG/5ML IV SOLN
INTRAVENOUS | Status: AC
  Administered 2020-07-23: 10 mg via INTRAVENOUS
  Filled 2020-07-23: qty 10

## 2020-07-23 MED ORDER — METOPROLOL TARTRATE 5 MG/5ML IV SOLN
10.0000 mg | INTRAVENOUS | Status: AC | PRN
  Administered 2020-07-23: 10 mg via INTRAVENOUS

## 2020-07-23 MED ORDER — NITROGLYCERIN 0.4 MG SL SUBL
SUBLINGUAL_TABLET | SUBLINGUAL | Status: AC
  Filled 2020-07-23: qty 2

## 2020-07-23 MED ORDER — DILTIAZEM HCL 25 MG/5ML IV SOLN: 10.0000 mg | INTRAVENOUS | Status: DC | PRN

## 2020-07-23 NOTE — Progress Notes (Signed)
Pt's heart rate maintaining in the 80s even after 20 mg metoprolol given. Pt reports light headedness but SBP remains in the 120s. Dr. Anne Fu notified. Per MD, give 250 ml NS bolus and give IV diltiazem if tolerated.

## 2020-07-24 ENCOUNTER — Inpatient Hospital Stay (HOSPITAL_COMMUNITY)
Admission: EM | Disposition: A | Discharge status: Home / Self Care | Payer: Self-pay | Source: Home / Self Care | Attending: Cardiovascular Disease

## 2020-07-24 ENCOUNTER — Emergency Department (HOSPITAL_COMMUNITY): Payer: 59

## 2020-07-24 ENCOUNTER — Ambulatory Visit (HOSPITAL_COMMUNITY): Admit: 2020-07-24 | Payer: 59 | Admitting: Cardiovascular Disease

## 2020-07-24 ENCOUNTER — Inpatient Hospital Stay (HOSPITAL_COMMUNITY)
Admission: EM | Admit: 2020-07-24 | Discharge: 2020-07-25 | DRG: 247 | Disposition: A | Discharge status: Home / Self Care | Payer: 59 | Attending: Cardiovascular Disease | Admitting: Cardiovascular Disease

## 2020-07-24 ENCOUNTER — Observation Stay (HOSPITAL_COMMUNITY): Payer: 59

## 2020-07-24 DIAGNOSIS — Z7984 Long term (current) use of oral hypoglycemic drugs: Secondary | ICD-10-CM

## 2020-07-24 DIAGNOSIS — E669 Obesity, unspecified: Secondary | ICD-10-CM | POA: Diagnosis present

## 2020-07-24 DIAGNOSIS — Z006 Encounter for examination for normal comparison and control in clinical research program: Secondary | ICD-10-CM

## 2020-07-24 DIAGNOSIS — F1020 Alcohol dependence, uncomplicated: Secondary | ICD-10-CM | POA: Diagnosis present

## 2020-07-24 DIAGNOSIS — I25110 Atherosclerotic heart disease of native coronary artery with unstable angina pectoris: Secondary | ICD-10-CM | POA: Diagnosis not present

## 2020-07-24 DIAGNOSIS — Z955 Presence of coronary angioplasty implant and graft: Secondary | ICD-10-CM

## 2020-07-24 DIAGNOSIS — F17290 Nicotine dependence, other tobacco product, uncomplicated: Secondary | ICD-10-CM | POA: Diagnosis present

## 2020-07-24 DIAGNOSIS — Z8249 Family history of ischemic heart disease and other diseases of the circulatory system: Secondary | ICD-10-CM

## 2020-07-24 DIAGNOSIS — E785 Hyperlipidemia, unspecified: Secondary | ICD-10-CM | POA: Diagnosis present

## 2020-07-24 DIAGNOSIS — Z6831 Body mass index (BMI) 31.0-31.9, adult: Secondary | ICD-10-CM

## 2020-07-24 DIAGNOSIS — Z20822 Contact with and (suspected) exposure to covid-19: Secondary | ICD-10-CM | POA: Diagnosis present

## 2020-07-24 DIAGNOSIS — I10 Essential (primary) hypertension: Secondary | ICD-10-CM | POA: Diagnosis present

## 2020-07-24 DIAGNOSIS — K760 Fatty (change of) liver, not elsewhere classified: Secondary | ICD-10-CM | POA: Diagnosis present

## 2020-07-24 DIAGNOSIS — E782 Mixed hyperlipidemia: Secondary | ICD-10-CM | POA: Diagnosis not present

## 2020-07-24 DIAGNOSIS — R079 Chest pain, unspecified: Secondary | ICD-10-CM | POA: Diagnosis not present

## 2020-07-24 DIAGNOSIS — I200 Unstable angina: Secondary | ICD-10-CM | POA: Diagnosis present

## 2020-07-24 DIAGNOSIS — I2510 Atherosclerotic heart disease of native coronary artery without angina pectoris: Secondary | ICD-10-CM

## 2020-07-24 DIAGNOSIS — E1169 Type 2 diabetes mellitus with other specified complication: Secondary | ICD-10-CM | POA: Diagnosis present

## 2020-07-24 DIAGNOSIS — I208 Other forms of angina pectoris: Secondary | ICD-10-CM

## 2020-07-24 DIAGNOSIS — Z833 Family history of diabetes mellitus: Secondary | ICD-10-CM

## 2020-07-24 DIAGNOSIS — K219 Gastro-esophageal reflux disease without esophagitis: Secondary | ICD-10-CM | POA: Diagnosis present

## 2020-07-24 DIAGNOSIS — Z79899 Other long term (current) drug therapy: Secondary | ICD-10-CM

## 2020-07-24 HISTORY — PX: INTRAVASCULAR ULTRASOUND/IVUS: CATH118244

## 2020-07-24 HISTORY — PX: CORONARY STENT INTERVENTION: CATH118234

## 2020-07-24 HISTORY — PX: LEFT HEART CATH AND CORONARY ANGIOGRAPHY: CATH118249

## 2020-07-24 LAB — ECHOCARDIOGRAM COMPLETE
AR max vel: 3.59 cm2
AV Area VTI: 4.3 cm2
AV Area mean vel: 2.92 cm2
AV Mean grad: 2 mmHg
AV Peak grad: 3.6 mmHg
Ao pk vel: 0.95 m/s
Area-P 1/2: 3.99 cm2
Height: 69 in
MV VTI: 3.84 cm2
S' Lateral: 3.2 cm
Weight: 3456 oz

## 2020-07-24 LAB — POCT ACTIVATED CLOTTING TIME: Activated Clotting Time: 416 seconds

## 2020-07-24 LAB — BASIC METABOLIC PANEL
Anion gap: 12 (ref 5–15)
BUN: 13 mg/dL (ref 6–20)
CO2: 26 mmol/L (ref 22–32)
Calcium: 9.2 mg/dL (ref 8.9–10.3)
Chloride: 102 mmol/L (ref 98–111)
Creatinine, Ser: 0.81 mg/dL (ref 0.61–1.24)
GFR, Estimated: >60 mL/min (ref >60)
Glucose, Bld: 171 mg/dL — ABNORMAL HIGH (ref 70–99)
Potassium: 4 mmol/L (ref 3.5–5.1)
Sodium: 140 mmol/L (ref 135–145)

## 2020-07-24 LAB — HEMOGLOBIN A1C
Hgb A1c MFr Bld: 8.2 % — ABNORMAL HIGH (ref 4.8–5.6)
Mean Plasma Glucose: 189 mg/dL

## 2020-07-24 LAB — TROPONIN I (HIGH SENSITIVITY)
Troponin I (High Sensitivity): 7 ng/L (ref <18)
Troponin I (High Sensitivity): 7 ng/L (ref <18)

## 2020-07-24 LAB — CBC
HCT: 47.9 % (ref 39.0–52.0)
Hemoglobin: 17.4 g/dL — ABNORMAL HIGH (ref 13.0–17.0)
MCH: 32.5 pg (ref 26.0–34.0)
MCHC: 36.3 g/dL — ABNORMAL HIGH (ref 30.0–36.0)
MCV: 89.5 fL (ref 80.0–100.0)
Platelets: 155 10*3/uL (ref 150–400)
RBC: 5.35 MIL/uL (ref 4.22–5.81)
RDW: 12 % (ref 11.5–15.5)
WBC: 4.5 10*3/uL (ref 4.0–10.5)
nRBC: 0 % (ref 0.0–0.2)

## 2020-07-24 LAB — RESP PANEL BY RT-PCR (FLU A&B, COVID) ARPGX2
Influenza A by PCR: NEGATIVE
Influenza B by PCR: NEGATIVE
SARS Coronavirus 2 by RT PCR: NEGATIVE

## 2020-07-24 LAB — GLUCOSE, CAPILLARY
Glucose-Capillary: 198 mg/dL — ABNORMAL HIGH (ref 70–99)
Glucose-Capillary: 236 mg/dL — ABNORMAL HIGH (ref 70–99)

## 2020-07-24 SURGERY — LEFT HEART CATH AND CORONARY ANGIOGRAPHY
Anesthesia: LOCAL

## 2020-07-24 MED ORDER — LIDOCAINE HCL (PF) 1 % IJ SOLN
INTRAMUSCULAR | Status: DC | PRN
  Administered 2020-07-24: 2 mL via INTRADERMAL

## 2020-07-24 MED ORDER — SODIUM CHLORIDE 0.9 % WEIGHT BASED INFUSION
1.0000 mL/kg/h | INTRAVENOUS | Status: DC
  Administered 2020-07-24: 1 mL/kg/h via INTRAVENOUS

## 2020-07-24 MED ORDER — SODIUM CHLORIDE 0.9% FLUSH: 3.0000 mL | INTRAVENOUS | Status: DC | PRN

## 2020-07-24 MED ORDER — MIDAZOLAM HCL 2 MG/2ML IJ SOLN
INTRAMUSCULAR | Status: AC
  Filled 2020-07-24: qty 2

## 2020-07-24 MED ORDER — FENTANYL CITRATE (PF) 100 MCG/2ML IJ SOLN
INTRAMUSCULAR | Status: AC
  Filled 2020-07-24: qty 2

## 2020-07-24 MED ORDER — ATORVASTATIN CALCIUM 40 MG PO TABS
40.0000 mg | ORAL_TABLET | Freq: Every day | ORAL | Status: DC
  Administered 2020-07-24: 40 mg via ORAL
  Filled 2020-07-24 (×2): qty 1

## 2020-07-24 MED ORDER — SODIUM CHLORIDE 0.9 % IV SOLN: 250.0000 mL | INTRAVENOUS | Status: DC | PRN

## 2020-07-24 MED ORDER — HEPARIN BOLUS VIA INFUSION
4000.0000 [IU] | Freq: Once | INTRAVENOUS | Status: AC
  Administered 2020-07-24: 4000 [IU] via INTRAVENOUS
  Filled 2020-07-24: qty 4000

## 2020-07-24 MED ORDER — SODIUM CHLORIDE 0.9 % WEIGHT BASED INFUSION
3.0000 mL/kg/h | INTRAVENOUS | Status: AC
  Administered 2020-07-24: 3 mL/kg/h via INTRAVENOUS

## 2020-07-24 MED ORDER — HEPARIN SODIUM (PORCINE) 1000 UNIT/ML IJ SOLN
INTRAMUSCULAR | Status: AC
  Filled 2020-07-24: qty 1

## 2020-07-24 MED ORDER — HEPARIN (PORCINE) IN NACL 1000-0.9 UT/500ML-% IV SOLN
INTRAVENOUS | Status: AC
  Filled 2020-07-24: qty 1000

## 2020-07-24 MED ORDER — PANTOPRAZOLE SODIUM 40 MG PO TBEC
40.0000 mg | DELAYED_RELEASE_TABLET | Freq: Every day | ORAL | Status: DC
  Filled 2020-07-24: qty 1

## 2020-07-24 MED ORDER — CLOPIDOGREL BISULFATE 75 MG PO TABS
75.0000 mg | ORAL_TABLET | Freq: Every day | ORAL | Status: DC
  Administered 2020-07-25: 75 mg via ORAL
  Filled 2020-07-24: qty 1

## 2020-07-24 MED ORDER — MELATONIN 5 MG PO TABS
5.0000 mg | ORAL_TABLET | Freq: Every evening | ORAL | Status: DC | PRN
  Administered 2020-07-24: 5 mg via ORAL
  Filled 2020-07-24: qty 1

## 2020-07-24 MED ORDER — FENTANYL CITRATE (PF) 100 MCG/2ML IJ SOLN
INTRAMUSCULAR | Status: DC | PRN
  Administered 2020-07-24: 50 ug via INTRAVENOUS
  Administered 2020-07-24: 25 ug via INTRAVENOUS

## 2020-07-24 MED ORDER — HYDRALAZINE HCL 20 MG/ML IJ SOLN: 10.0000 mg | INTRAMUSCULAR | Status: AC | PRN

## 2020-07-24 MED ORDER — NITROGLYCERIN 1 MG/10 ML FOR IR/CATH LAB
INTRA_ARTERIAL | Status: DC | PRN
  Administered 2020-07-24: 200 ug via INTRACORONARY

## 2020-07-24 MED ORDER — NITROGLYCERIN 1 MG/10 ML FOR IR/CATH LAB
INTRA_ARTERIAL | Status: AC
  Filled 2020-07-24: qty 10

## 2020-07-24 MED ORDER — ASPIRIN EC 81 MG PO TBEC
81.0000 mg | DELAYED_RELEASE_TABLET | Freq: Every day | ORAL | Status: DC
  Administered 2020-07-25: 81 mg via ORAL
  Filled 2020-07-24: qty 1

## 2020-07-24 MED ORDER — LIDOCAINE HCL (PF) 1 % IJ SOLN
INTRAMUSCULAR | Status: AC
  Filled 2020-07-24: qty 30

## 2020-07-24 MED ORDER — INSULIN ASPART 100 UNIT/ML IJ SOLN
0.0000 [IU] | Freq: Three times a day (TID) | INTRAMUSCULAR | Status: DC
  Administered 2020-07-25: 3 [IU] via SUBCUTANEOUS

## 2020-07-24 MED ORDER — ASPIRIN 81 MG PO CHEW: 81.0000 mg | CHEWABLE_TABLET | ORAL | Status: DC

## 2020-07-24 MED ORDER — SODIUM CHLORIDE 0.9 % WEIGHT BASED INFUSION: 3.0000 mL/kg/h | INTRAVENOUS | Status: DC

## 2020-07-24 MED ORDER — ONDANSETRON HCL 4 MG/2ML IJ SOLN: 4.0000 mg | Freq: Four times a day (QID) | INTRAMUSCULAR | Status: DC | PRN

## 2020-07-24 MED ORDER — CLOPIDOGREL BISULFATE 300 MG PO TABS
ORAL_TABLET | ORAL | Status: DC | PRN
  Administered 2020-07-24: 600 mg via ORAL

## 2020-07-24 MED ORDER — ACETAMINOPHEN 325 MG PO TABS
650.0000 mg | ORAL_TABLET | ORAL | Status: DC | PRN
  Administered 2020-07-24: 650 mg via ORAL
  Filled 2020-07-24: qty 2

## 2020-07-24 MED ORDER — HEPARIN (PORCINE) IN NACL 1000-0.9 UT/500ML-% IV SOLN
INTRAVENOUS | Status: DC | PRN
  Administered 2020-07-24 (×3): 500 mL

## 2020-07-24 MED ORDER — MIDAZOLAM HCL 2 MG/2ML IJ SOLN
INTRAMUSCULAR | Status: DC | PRN
  Administered 2020-07-24: 2 mg via INTRAVENOUS
  Administered 2020-07-24: 1 mg via INTRAVENOUS

## 2020-07-24 MED ORDER — FAMOTIDINE IN NACL 20-0.9 MG/50ML-% IV SOLN
INTRAVENOUS | Status: AC
  Filled 2020-07-24: qty 50

## 2020-07-24 MED ORDER — EMPAGLIFLOZIN 25 MG PO TABS
25.0000 mg | ORAL_TABLET | Freq: Every day | ORAL | Status: DC
  Filled 2020-07-24: qty 1

## 2020-07-24 MED ORDER — HEPARIN (PORCINE) 25000 UT/250ML-% IV SOLN
1250.0000 [IU]/h | INTRAVENOUS | Status: DC
  Administered 2020-07-24: 1250 [IU]/h via INTRAVENOUS
  Filled 2020-07-24: qty 250

## 2020-07-24 MED ORDER — INSULIN ASPART 100 UNIT/ML IJ SOLN
0.0000 [IU] | Freq: Three times a day (TID) | INTRAMUSCULAR | Status: DC
  Administered 2020-07-24: 3 [IU] via SUBCUTANEOUS

## 2020-07-24 MED ORDER — VERAPAMIL HCL 2.5 MG/ML IV SOLN
INTRAVENOUS | Status: AC
  Filled 2020-07-24: qty 2

## 2020-07-24 MED ORDER — HEPARIN SODIUM (PORCINE) 1000 UNIT/ML IJ SOLN
INTRAMUSCULAR | Status: DC | PRN
  Administered 2020-07-24: 7000 [IU] via INTRAVENOUS
  Administered 2020-07-24: 5000 [IU] via INTRAVENOUS

## 2020-07-24 MED ORDER — LABETALOL HCL 5 MG/ML IV SOLN: 10.0000 mg | INTRAVENOUS | Status: AC | PRN

## 2020-07-24 MED ORDER — PERFLUTREN LIPID MICROSPHERE
1.0000 mL | INTRAVENOUS | Status: AC | PRN
  Administered 2020-07-24: 3 mL via INTRAVENOUS
  Filled 2020-07-24: qty 10

## 2020-07-24 MED ORDER — CLOPIDOGREL BISULFATE 75 MG PO TABS: 75.0000 mg | ORAL_TABLET | Freq: Every day | ORAL | Status: DC

## 2020-07-24 MED ORDER — VERAPAMIL HCL 2.5 MG/ML IV SOLN
INTRAVENOUS | Status: DC | PRN
  Administered 2020-07-24: 10 mL via INTRA_ARTERIAL

## 2020-07-24 MED ORDER — NITROGLYCERIN 0.4 MG SL SUBL: 0.4000 mg | SUBLINGUAL_TABLET | SUBLINGUAL | Status: DC | PRN

## 2020-07-24 MED ORDER — SODIUM CHLORIDE 0.9 % WEIGHT BASED INFUSION: 1.0000 mL/kg/h | INTRAVENOUS | Status: DC

## 2020-07-24 MED ORDER — ONDANSETRON HCL 4 MG/2ML IJ SOLN
INTRAMUSCULAR | Status: AC
  Filled 2020-07-24: qty 2

## 2020-07-24 MED ORDER — SODIUM CHLORIDE 0.9% FLUSH: 3.0000 mL | Freq: Two times a day (BID) | INTRAVENOUS | Status: DC

## 2020-07-24 MED ORDER — CLOPIDOGREL BISULFATE 300 MG PO TABS
ORAL_TABLET | ORAL | Status: AC
  Filled 2020-07-24: qty 2

## 2020-07-24 MED ORDER — INSULIN ASPART 100 UNIT/ML IJ SOLN
0.0000 [IU] | Freq: Every day | INTRAMUSCULAR | Status: DC
  Administered 2020-07-24: 2 [IU] via SUBCUTANEOUS

## 2020-07-24 MED ORDER — NEBIVOLOL HCL 10 MG PO TABS
10.0000 mg | ORAL_TABLET | Freq: Every day | ORAL | Status: DC
  Filled 2020-07-24: qty 1

## 2020-07-24 MED ORDER — FENOFIBRATE 160 MG PO TABS
160.0000 mg | ORAL_TABLET | Freq: Every day | ORAL | Status: DC
  Filled 2020-07-24: qty 1

## 2020-07-24 MED ORDER — FAMOTIDINE IN NACL 20-0.9 MG/50ML-% IV SOLN
INTRAVENOUS | Status: AC | PRN
  Administered 2020-07-24: 20 mg via INTRAVENOUS

## 2020-07-24 MED ORDER — SODIUM CHLORIDE 0.9 % IV SOLN
INTRAVENOUS | Status: DC
  Administered 2020-07-24: 350 mL via INTRAVENOUS

## 2020-07-24 MED ORDER — SODIUM CHLORIDE 0.9% FLUSH
3.0000 mL | Freq: Two times a day (BID) | INTRAVENOUS | Status: DC
  Administered 2020-07-25: 3 mL via INTRAVENOUS

## 2020-07-24 SURGICAL SUPPLY — 19 items
BALLN SAPPHIRE 2.0X12 (BALLOONS) ×2
BALLN SAPPHIRE ~~LOC~~ 3.25X12 (BALLOONS) ×2 IMPLANT
BALLOON SAPPHIRE 2.0X12 (BALLOONS) ×1 IMPLANT
CATH 5FR JL3.5 JR4 ANG PIG MP (CATHETERS) ×2 IMPLANT
CATH OPTICROSS HD (CATHETERS) ×2 IMPLANT
CATH VISTA GUIDE 6FR XBLAD3.5 (CATHETERS) ×2 IMPLANT
DEVICE RAD COMP TR BAND LRG (VASCULAR PRODUCTS) ×2 IMPLANT
GLIDESHEATH SLEND SS 6F .021 (SHEATH) ×2 IMPLANT
GUIDEWIRE INQWIRE 1.5J.035X260 (WIRE) ×1 IMPLANT
INQWIRE 1.5J .035X260CM (WIRE) ×2
KIT ENCORE 26 ADVANTAGE (KITS) ×2 IMPLANT
KIT HEART LEFT (KITS) ×2 IMPLANT
PACK CARDIAC CATHETERIZATION (CUSTOM PROCEDURE TRAY) ×2 IMPLANT
SLED PULL BACK IVUS (MISCELLANEOUS) ×2 IMPLANT
STENT SYNERGY XD 3.0X16 (Permanent Stent) ×1 IMPLANT
SYNERGY XD 3.0X16 (Permanent Stent) ×2 IMPLANT
TRANSDUCER W/STOPCOCK (MISCELLANEOUS) ×2 IMPLANT
TUBING CIL FLEX 10 FLL-RA (TUBING) ×2 IMPLANT
WIRE COUGAR XT STRL 190CM (WIRE) ×4 IMPLANT

## 2020-07-24 NOTE — ED Notes (Signed)
Family updated as to patient's status.

## 2020-07-24 NOTE — H&P (Addendum)
Cardiology Admission History and Physical:   Patient ID: Peter Keller MRN: [id number]; DOB: May 20, 1975   Admission date: 07/24/2020  PCP:  Myrlene Broker, MD   [facility] Providers Cardiologist:  None   {  Chief Complaint: chest pain/back pain  Patient Profile:   Peter Keller is a 45 y.o. male with a history of hypertension, hyperlipidemia, diabetes mellitus, GERD, alcohol abuse, and tobacco abuse who is being seen today for evaluation of chest pain concerning for unstable angina.  History of Present Illness:   Peter Keller is a 45 year old male with the above history who is followed by Dr. Ladona Ridgel. Echo in 01/2017 showed LVEF of 55-60% with normal wall motion. Holter Monitor in 2018 showed normal sinus rhythm with rare PVCs/PACs. He was recently seen by Dr. Ladona Ridgel in 05/2020 at which time he reported exertional chest pain. Coronary CTA was ordered for further evaluation. This was performed yesterday and showed 75% stenosis of mid LAD with positive FFR. Patient had some recurrent chest pain following this and therefore presented to the ED for further evaluation.  Patient reports right upper back/shoulder 8/10 pain that radiates up to his neck and sometimes to the right side of his chest. He states this has been constant for the past couple of months. Also reports intermittent 5/10 left sided chest pain at rest and with exertion, maybe slightly worse with exertion. He states this has been occurring more frequently. He also notes new dyspnea on exertion. He states he is not very active but notes being short of breath with minimal activity such as walk short distances which is new. He also describes possible orthopnea. No PND or edema. No recent fevers or illness. No GI symptoms. No abnormal bleeding in urine or stools.   In the ED, patient hypertensive but otherwise vitals stable. EKG showed no acute ischemic changes. Troponin pending. Chest x-ray pending. Initial labs pending.   At the  time of this evaluation, patient currently chest pain free but is having that right upper back/shoulder pain.   Patient does report some tobacco use. He smokes 1-2 cigars per weeks. He also reports daily alcohol use. He states he drinks one bottle of wine every day. He states he quit for about 1 month in December but then started drinking again. No withdrawal symptoms when he quit. No recreational drug use.    Past Medical History:  Diagnosis Date   Alcohol dependence (HCC)    Diabetes (HCC)    GERD (gastroesophageal reflux disease)    Hepatic steatosis    Hyperlipidemia    Hypertension    Obesity    Sinus tachycardia     Past Surgical History:  Procedure Laterality Date   none       Medications Prior to Admission: Prior to Admission medications   Medication Sig Start Date End Date Taking? Authorizing Provider  atorvastatin (LIPITOR) 20 MG tablet Take 1 tablet (20 mg total) by mouth daily. 03/06/20   Myrlene Broker, MD  Blood Glucose Monitoring [facility] VERIO IQ SYSTEM) w/Device KIT Use as directed 12/30/15   Carlus Pavlov, MD  buPROPion (WELLBUTRIN XL) 150 MG 24 hr tablet Take 1 tablet (150 mg total) by mouth daily. 04/21/20   Myrlene Broker, MD  Cholecalciferol (VITAMIN D) 400 UNITS capsule Take 500 Units by mouth daily.    [provider]  Continuous Blood Gluc Receiver (FREESTYLE LIBRE 14 DAY READER) DEVI USE AS DIRECTED BY YOUR DOCTOR FOR 1 DOSE 02/22/19  Philemon Kingdom, MD  Continuous Blood Gluc Sensor (FREESTYLE LIBRE 14 DAY SENSOR) MISC USE EVERY 14 DAYS AND CHANGE EVERY 2 WEEKS 04/16/20   Philemon Kingdom, MD  Dulaglutide (TRULICITY) 3 UD/1.4HF SOPN Inject 3 mg into the skin once a week. 03/06/20   Hoyt Koch, MD  esomeprazole (NEXIUM) 20 MG capsule Take 20 mg by mouth daily at 12 noon.    [provider]  fenofibrate (TRICOR) 145 MG tablet Take 1 tablet (145 mg total) by mouth daily. 03/06/20   Hoyt Koch, MD   glucose blood (ONETOUCH VERIO) test strip USE AS INSTRUCTED TO CHECK BLOOD SUGAR 1x DAILY 01/29/19   Philemon Kingdom, MD  JARDIANCE 25 MG TABS tablet Take 25 mg by mouth daily. 05/16/20   [provider]  losartan-hydrochlorothiazide (HYZAAR) 100-25 MG tablet Take 1 tablet by mouth daily. 03/06/20   Hoyt Koch, MD  metFORMIN (GLUCOPHAGE) 1000 MG tablet Take 1 tablet (1,000 mg total) by mouth 2 (two) times daily with a meal. 03/06/20   Hoyt Koch, MD  metoprolol tartrate (LOPRESSOR) 100 MG tablet Take one tablet by mouth 2 hours prior to cardiac CT 06/05/20   Evans Lance, MD  nebivolol (BYSTOLIC) 5 MG tablet Take 2 tablets (10 mg total) by mouth daily. 03/06/20   Hoyt Koch, MD  nystatin-triamcinolone ointment [facility]) Apply 1 application topically 2 (two) times daily. 04/23/18   Hoyt Koch, MD  [facility] DELICA LANCETS 02O MISC USE TO OBTAIN A BLOOD SPECIMEN ONCE PER DAY 08/23/16   Philemon Kingdom, MD     Allergies:   No Known Allergies  Social History:   Social History   Socioeconomic History   Marital status: Married    Spouse name: Not on file   Number of children: Not on file   Years of education: Not on file   Highest education level: Not on file  Occupational History   Not on file  Tobacco Use   Smoking status: Never   Smokeless tobacco: Never  Substance and Sexual Activity   Alcohol use: Yes    Comment: 3 alcoholic drinks daily   Drug use: No   Sexual activity: Not on file  Other Topics Concern   Not on file  Social History Narrative   Not on file   Social Determinants of Health   Financial Resource Strain: Not on file  Food Insecurity: Not on file  Transportation Needs: Not on file  Physical Activity: Not on file  Stress: Not on file  Social Connections: Not on file  Intimate Partner Violence: Not on file    Family History:   The patient's family history includes Diabetes in his mother; Heart attack in his  paternal grandmother; Heart disease in his paternal uncle; Hypertension in his mother. There is no history of Colon cancer, Esophageal cancer, or Stomach cancer.    ROS:  Please see the history of present illness.  All other ROS reviewed and negative.     Physical Exam/Data:   Vitals:   07/24/20 0800  BP: (!) 157/99  Pulse: 89  Resp: 18  Temp: 98.7 F (37.1 C)  TempSrc: Oral  SpO2: 97%   No intake or output data in the 24 hours ending 07/24/20 0920 Last 3 Weights 06/05/2020 03/06/2020 05/28/2019  Weight (lbs) 216 lb 3.2 oz 203 lb 6.4 oz 211 lb 9.6 oz  Weight (kg) 98.068 kg 92.262 kg 95.981 kg     There is no height or weight on  file to calculate BMI.  General: 45 y.o. male resting comfortably in no acute distress. HEENT: Normocephalic and atraumatic. Sclera clear.  Neck: Supple. No carotid bruits. No JVD. Heart: RRR. Distinct S1 and S2. No murmurs, gallops, or rubs. Radial and distal pedal pulses 2+ and equal bilaterally. Lungs: No increased work of breathing. Clear to ausculation bilaterally. No wheezes, rhonchi, or rales.  Abdomen: Soft, non-distended, and non-tender to palpation. Bowel sounds present. MSK: Normal strength and tone for age. Extremities: No lower extremity edema.    Skin: Warm and dry. Neuro: Alert and oriented x3. No focal deficits. Psych: Normal affect. Responds appropriately.  EKG:  The ECG that was done was personally reviewed and demonstrates normal sinus rhythm, rate 99 bpm, with no acute ischemic changes.  Relevant CV Studies:  Coronary CTA 07/23/2020: Impressions: 1. Coronary calcium score of 60. This was 66 percentile for age (adjusted for 7) and sex matched control. 2. Normal coronary origin with right dominance. 3.  Distal Left main calcified plaque (0-24%). 4.  Mid LAD 75% stenosis.  Sending for FFR.  1. Abnormal FFR of mid LAD suggestive of flow limiting CAD. Recommend cardiac catheterization.  Laboratory Data:  High Sensitivity  Troponin:   Recent Labs  Lab 07/24/20 0810  TROPONINIHS 7      Chemistry Recent Labs  Lab 07/20/20 1513 07/24/20 0810  NA 144 140  K 4.0 4.0  CL 104 102  CO2 19* 26  GLUCOSE 140* 171*  BUN 14 13  CREATININE 0.90 0.81  CALCIUM 9.3 9.2  GFRNONAA  --  >60  ANIONGAP  --  12    No results for input(s): PROT, ALBUMIN, AST, ALT, ALKPHOS, BILITOT in the last 168 hours. Hematology Recent Labs  Lab 07/24/20 0810  WBC 4.5  RBC 5.35  HGB 17.4*  HCT 47.9  MCV 89.5  MCH 32.5  MCHC 36.3*  RDW 12.0  PLT 155   BNPNo results for input(s): BNP, PROBNP in the last 168 hours.  DDimer No results for input(s): DDIMER in the last 168 hours.   Radiology/Studies:  DG Chest 2 View  Result Date: 07/24/2020 CLINICAL DATA:  Chest pain and shortness of breath EXAM: CHEST - 2 VIEW COMPARISON:  November 10, 2018 FINDINGS: Lungs are clear. Heart size and pulmonary vascularity are normal. No adenopathy. No pneumothorax. No bone lesions. IMPRESSION: Lungs clear.  Cardiac silhouette normal. Electronically Signed   By: Lowella Grip III M.D.   On: 07/24/2020 08:37   CT CORONARY MORPH W/CTA COR W/SCORE W/CA W/CM &/OR WO/CM  Addendum Date: 07/23/2020   ADDENDUM REPORT: 07/23/2020 13:44 CLINICAL DATA:  45 year old with EXAM: Cardiac/Coronary  CTA TECHNIQUE: The patient was scanned on a [employer]. FINDINGS: A 100 kV prospective scan was triggered in the descending thoracic aorta at 111 HU's. Axial non-contrast 3 mm slices were carried out through the heart. The data set was analyzed on a dedicated work station and scored using the [place]. Gantry rotation speed was 250 msecs and collimation was .6 mm. beta blockade and 0.8 mg of sl NTG was given. The 3D data set was reconstructed in 5% intervals of the 67-82 % of the R-R cycle. Diastolic phases were analyzed on a dedicated work station using MPR, MIP and VRT modes. The patient received 80 cc of contrast. Aorta:  Normal size.  No  calcifications.  No dissection. Aortic Valve:  Trileaflet.  No calcifications. Coronary Arteries:  Normal coronary origin.  Right dominance. RCA is a  large dominant artery that gives rise to PDA and PLA. There is no plaque. Left main is a large artery that gives rise to LAD and LCX arteries. There is distal focal calcified plaque, 0-24% stenosis. LAD is a large vessel that has mid calcified and non calcified plaque. There is stenosis of 75% mid vessel. Sending for FFR. LCX is a non-dominant artery that gives rise to one large OM1 branch. There is no plaque. Ramus is moderate sized with no plaque. Other findings: Normal pulmonary vein drainage into the left atrium. Normal left atrial appendage without a thrombus. Normal size of the pulmonary artery. Please see radiology report for non cardiac findings. IMPRESSION: 1. Coronary calcium score of 60. This was 79 percentile for age (adjusted for 65) and sex matched control. 2. Normal coronary origin with right dominance. 3.  Distal Left main calcified plaque (0-24%). 4.  Mid LAD 75% stenosis.  Sending for FFR. Electronically Signed   By: Candee Furbish MD   On: 07/23/2020 13:44   Result Date: 07/23/2020 EXAM: OVER-READ INTERPRETATION  CT CHEST The following report is an over-read performed by radiologist Dr. Rolm Baptise of [facility], [place] on 07/23/2020. This over-read does not include interpretation of cardiac or coronary anatomy or pathology. The coronary CTA interpretation by the cardiologist is attached. COMPARISON:  10/30/2015 FINDINGS: Vascular: Heart is normal size. Aorta is normal caliber. Mediastinum/Nodes: No adenopathy Lungs/Pleura: No confluent opacities or effusions. Upper Abdomen: Low-density throughout the visualized liver, likely fatty infiltration. Musculoskeletal: Chest wall soft tissues are unremarkable. No acute bony abnormality. IMPRESSION: Hepatic steatosis. No acute extra cardiac abnormality. Electronically Signed: By: Rolm Baptise M.D. On:  07/23/2020 11:31   CT CORONARY FRACTIONAL FLOW RESERVE DATA PREP  Result Date: 07/23/2020 EXAM: FFRCT ANALYSIS - abnormal CT coronary FINDINGS: FFRct analysis was performed on the original cardiac CT angiogram dataset. Diagrammatic representation of the FFRct analysis is provided in a separate PDF document in PACS. This dictation was created using the PDF document and an interactive 3D model of the results. 3D model is not available in the EMR/PACS. Normal FFR range is >0.80. 1. Left Main: normal 2. LAD: Abnormal, mid lesion 0.96 - 0.81 - 0.76 - 0.71 3. LCX: normal 4. Ramus: normal 5. RCA: normal IMPRESSION: 1. Abnormal FFR of mid LAD suggestive of flow limiting CAD. Recommend cardiac catheterization. Note: These examples are not recommendations of [employer] and only provided as examples of what other customers are doing. Electronically Signed   By: Candee Furbish MD   On: 07/23/2020 14:37     Assessment and Plan:   Unstable Angina - Patient reports intermittent chest pain for the last couple of months that has been getting worse and occurring more frequently. Also notes constant right upper back/shoulder pain. Outpatient coronary CTA on 07/23/2020 showed 75% stenosis of mid LAD with positive FFR. - EKG shows no acute ST/T changes. - High-sensitivity troponin pending. - Echo pending.  - Currently chest pain free but does have the constant right upper back/shoulder pain. - Patient took 4 baby Aspirin this morning. Will start Aspirin $RemoveBefor'81mg'ncrQkysYARLp$  daily tomorrow. - Will start IV Heparin. - Continue home Nebivolol $RemoveBefore'10mg'mvsKjHHakjJMF$  daily. - Will increase Lipitor to $RemoveBe'40mg'fImcdeJql$  daily.  - Plan is for [facility] today. The patient understands that risks include but are not limited to stroke (1 in 1000), death (1 in 3), kidney failure [usually temporary] (1 in 500), bleeding (1 in 200), allergic reaction [possibly serious] (1 in 200), and agrees to proceed.   Hypertension -  BP mildly elevated but he has not taken any of his medications  today. - On Losartan-HCTZ 100-25mg  daily and Nebivolol 10mg  daily at home.  - Will continue home Nebivolol. Will hold Losartan-HCTZ prior to cath but this can likely be restarted afterwards.  Hyperlipidemia - LDL 83 in 02/2020. - On Lipitor 20mg  daily at home. Will increase to 40mg  daily.  - Will repeat fasting lipid panel tomorrow morning.  Type 2 Diabetes Mellitus - Hemoglobin A1c 6.1 in 02/2020. - On Metformin 1000mg  once daily, Trulicity on Sunday, and Jardiance 25mg  daily.  - Will continue Jardiance here. - Will hold Metformin and Trulicity and place on sliding scale insulin.  - Will repeat hemoglobin A1c.  Tobacco Abuse - Patient reports smoking 1-2 cigars per week.  - Will need to educate on important of complete cessation.  Alcohol Abuse - Patient drinks 1 bottle of wine on a daily basis. Last drink was yesterday. He states he quit drinking for 1 month back in December and then started drinking again. No history of withdrawal symptoms or DTs. - Will not place on CIWA protocol for now but will continue to monitor for signs of withdrawal.    Risk Assessment/Risk Scores:  \ TIMI Risk Score for Unstable Angina or Non-ST Elevation MI:   The patient's TIMI risk score is 4, which indicates a 20% risk of all cause mortality, new or recurrent myocardial infarction or need for urgent revascularization in the next 14 days.{    Severity of Illness: The appropriate patient status for this patient is OBSERVATION. Observation status is judged to be reasonable and necessary in order to provide the required intensity of service to ensure the patient's safety. The patient's presenting symptoms, physical exam findings, and initial radiographic and laboratory data in the context of their medical condition is felt to place them at decreased risk for further clinical deterioration. Furthermore, it is anticipated that the patient will be medically stable for discharge from the hospital within 2  midnights of admission. The following factors support the patient status of observation.   " The patient's presenting symptoms include chest pain. " The physical exam findings include as above. " The initial radiographic and laboratory data as above.  For questions or updates, please contact [place] Please consult [email or web address] for contact info under     Signed, Darreld Mclean, PA-C  07/24/2020 9:20 AM    Attending Note:   The patient was seen and examined.  Agree with assessment and plan as noted above.  Changes made to the above note as needed.  Patient seen and independently examined with Sande Rives, PA .   We discussed all aspects of the encounter. I agree with the assessment and plan as stated above.    Unstable angina:   Noah Charon presents with symptoms that are c/w UAP .  Progressive DOE,  bilateral shoulder / infrascapular pain .   This pain occurs with rest and exertion.   Worse with exertion .  Coronary CTA shows a mid LAD stenosis. I received a call this am from his wife ( Ripu Bouvier, [employer]) who expressed concern about his progressive symptoms.  Risk factors include HLD, HTN, DM.  + family hx   We have discussed risks, benefits, options of cath and PCI. He understands and agrees to proceed.    I have spent a total of 40 minutes with patient reviewing hospital  notes , telemetry, EKGs, labs and examining patient as well as establishing an  assessment and plan that was discussed with the patient.  > 50% of time was spent in direct patient care.    Thayer Headings, Brooke Bonito., MD, [facility] 07/24/2020, 10:34 AM [address]  [place] Pager [phone]

## 2020-07-24 NOTE — Plan of Care (Signed)

## 2020-07-24 NOTE — ED Notes (Signed)
Vital signs stable. 

## 2020-07-24 NOTE — ED Triage Notes (Signed)
Pt reports left shoulder/back pain x 2 weeks and intermittent L sided chest pain x 2 days, worsening since last night. Pain occurs even at rest and has increasing shob. Had cardiac CT yesterday and reports it showed partial occlusion of the LAD.

## 2020-07-24 NOTE — Research (Signed)
IDENTIFY Informed Consent                  Subject Name: Peter Keller   Subject met inclusion and exclusion criteria.  The informed consent form, study requirements and expectations were reviewed with the subject and questions and concerns were addressed prior to the signing of the consent form.  The subject verbalized understanding of the trial requirements.  The subject agreed to participate in the IDENTIFY trial and signed the informed consent at 09:54AM on 07/24/20.  The informed consent was obtained prior to performance of any protocol-specific procedures for the subject.  A copy of the signed informed consent was given to the subject and a copy was placed in the subject's medical record.   Meade Maw , Naval architect

## 2020-07-24 NOTE — ED Provider Notes (Signed)
[facility] EMERGENCY DEPARTMENT Provider Note   CSN: [id number] Arrival date & time: 07/24/20  0756     History Chief Complaint  Patient presents with   Chest Pain    Peter Keller is a 45 y.o. male.  HPI  45 year old male with past medical history of HTN, HLD, DM presents the emergency department intermittent chest and back pain.  Patient follows up as an outpatient with cardiology, due to his pain pattern he had a CT AC done that showed moderate disease of the LAD, it was recommended that he comes in for evaluation and possible heart cath.  On my evaluation patient is chest pain-free, states that this discomfort has been going on intermittently for the past couple months.  Denies any shortness of breath, no recent illness.  Wife is at bedside who is a hospitalist here.  Past Medical History:  Diagnosis Date   Alcohol dependence ([place])    Diabetes ([place])    GERD (gastroesophageal reflux disease)    Hepatic steatosis    Hyperlipidemia    Hypertension    Obesity    Sinus tachycardia     Patient Active Problem List   Diagnosis Date Noted   Chest pain of uncertain etiology 63/33/5456   Routine general medical examination at a health care facility 04/07/2015   Essential hypertension, benign 08/29/2014   Type 2 diabetes mellitus with hyperlipidemia ([place]) 08/29/2014   Fatty liver 08/29/2014   Snoring 12/27/2010   Alcohol dependence ([place]) 12/27/2010   Hyperlipidemia associated with type 2 diabetes mellitus ([place]) 12/27/2010   Obesity 07/17/2009   GERD 07/17/2009    Past Surgical History:  Procedure Laterality Date   none         Family History  Problem Relation Age of Onset   Hypertension Mother    Diabetes Mother    Heart disease Paternal Uncle    Heart attack Paternal Grandmother    Colon cancer Neg Hx    Esophageal cancer Neg Hx    Stomach cancer Neg Hx     Social History   Tobacco Use   Smoking status: Never   Smokeless tobacco: Never   Substance Use Topics   Alcohol use: Yes    Comment: 3 alcoholic drinks daily   Drug use: No    Home Medications Prior to Admission medications   Medication Sig Start Date End Date Taking? Authorizing Provider  atorvastatin (LIPITOR) 20 MG tablet Take 1 tablet (20 mg total) by mouth daily. 03/06/20  Yes Hoyt Koch, MD  Cholecalciferol (VITAMIN D) 400 UNITS capsule Take 500 Units by mouth daily.   Yes [provider]  Dulaglutide (TRULICITY) 3 YB/6.3SL SOPN Inject 3 mg into the skin once a week. 03/06/20  Yes Hoyt Koch, MD  esomeprazole (NEXIUM) 20 MG capsule Take 20 mg by mouth daily at 12 noon.   Yes [provider]  fenofibrate (TRICOR) 145 MG tablet Take 1 tablet (145 mg total) by mouth daily. 03/06/20  Yes Hoyt Koch, MD  JARDIANCE 25 MG TABS tablet Take 25 mg by mouth daily. 05/16/20  Yes [provider]  losartan-hydrochlorothiazide (HYZAAR) 100-25 MG tablet Take 1 tablet by mouth daily. 03/06/20  Yes Hoyt Koch, MD  metoprolol tartrate (LOPRESSOR) 100 MG tablet Take one tablet by mouth 2 hours prior to cardiac CT 06/05/20  Yes Evans Lance, MD  nebivolol (BYSTOLIC) 5 MG tablet Take 2 tablets (10 mg total) by mouth daily. 03/06/20  Yes Pricilla Holm  A, MD  Blood Glucose Monitoring Suppl (ONETOUCH VERIO IQ SYSTEM) w/Device KIT Use as directed 12/30/15   Philemon Kingdom, MD  buPROPion (WELLBUTRIN XL) 150 MG 24 hr tablet Take 1 tablet (150 mg total) by mouth daily. Patient not taking: Reported on 07/24/2020 04/21/20   Hoyt Koch, MD  Continuous Blood Gluc Receiver (FREESTYLE LIBRE 14 DAY READER) DEVI USE AS DIRECTED BY YOUR DOCTOR FOR 1 DOSE 02/22/19   Philemon Kingdom, MD  Continuous Blood Gluc Sensor (FREESTYLE LIBRE 14 DAY SENSOR) MISC USE EVERY 14 DAYS AND CHANGE EVERY 2 WEEKS 04/16/20   Philemon Kingdom, MD  glucose blood (ONETOUCH VERIO) test strip USE AS INSTRUCTED TO CHECK BLOOD SUGAR 1x DAILY 01/29/19    Philemon Kingdom, MD  metFORMIN (GLUCOPHAGE) 1000 MG tablet Take 1 tablet (1,000 mg total) by mouth 2 (two) times daily with a meal. 03/06/20   Hoyt Koch, MD  nystatin-triamcinolone ointment [facility]) Apply 1 application topically 2 (two) times daily. 04/23/18   Hoyt Koch, MD  [facility] DELICA LANCETS 00T MISC USE TO OBTAIN A BLOOD SPECIMEN ONCE PER DAY 08/23/16   Philemon Kingdom, MD    Allergies    Patient has no known allergies.  Review of Systems   Review of Systems  Constitutional:  Negative for chills and fever.  HENT:  Negative for congestion.   Eyes:  Negative for visual disturbance.  Respiratory:  Negative for shortness of breath.   Cardiovascular:  Positive for chest pain. Negative for palpitations and leg swelling.  Gastrointestinal:  Negative for abdominal pain, diarrhea and vomiting.  Genitourinary:  Negative for dysuria.  Musculoskeletal:  Positive for back pain.  Skin:  Negative for rash.  Neurological:  Negative for headaches.   Physical Exam Updated Vital Signs BP (!) 157/99 (BP Location: Right Arm)   Pulse 89   Temp 98.7 F (37.1 C) (Oral)   Resp 18   SpO2 97%   Physical Exam Vitals and nursing note reviewed.  Constitutional:      Appearance: Normal appearance.  HENT:     Head: Normocephalic.     Mouth/Throat:     Mouth: Mucous membranes are moist.  Cardiovascular:     Rate and Rhythm: Normal rate.  Pulmonary:     Effort: Pulmonary effort is normal. No respiratory distress.     Breath sounds: No decreased breath sounds.  Abdominal:     Palpations: Abdomen is soft.     Tenderness: There is no abdominal tenderness.  Musculoskeletal:     Right lower leg: No edema.     Left lower leg: No edema.  Skin:    General: Skin is warm.  Neurological:     Mental Status: He is alert and oriented to person, place, and time. Mental status is at baseline.  Psychiatric:        Mood and Affect: Mood normal.    ED Results / Procedures /  Treatments   Labs (all labs ordered are listed, but only abnormal results are displayed) Labs Reviewed  BASIC METABOLIC PANEL - Abnormal; Notable for the following components:      Result Value   Glucose, Bld 171 (*)    All other components within normal limits  CBC - Abnormal; Notable for the following components:   Hemoglobin 17.4 (*)    MCHC 36.3 (*)    All other components within normal limits  SARS CORONAVIRUS 2 (TAT 6-24 HRS)  RESP PANEL BY RT-PCR (FLU A&B, COVID) ARPGX2  TROPONIN I (HIGH  SENSITIVITY)    EKG EKG Interpretation  Date/Time:  Friday July 24 2020 08:05:03 EDT Ventricular Rate:  99 PR Interval:  166 QRS Duration: 90 QT Interval:  360 QTC Calculation: 462 R Axis:   56 Text Interpretation: Normal sinus rhythm Normal ECG NSR, no STEMI Confirmed by Lavenia Atlas [phone]) on 07/24/2020 8:41:53 AM  Radiology DG Chest 2 View  Result Date: 07/24/2020 CLINICAL DATA:  Chest pain and shortness of breath EXAM: CHEST - 2 VIEW COMPARISON:  November 10, 2018 FINDINGS: Lungs are clear. Heart size and pulmonary vascularity are normal. No adenopathy. No pneumothorax. No bone lesions. IMPRESSION: Lungs clear.  Cardiac silhouette normal. Electronically Signed   By: Lowella Grip III M.D.   On: 07/24/2020 08:37   CT CORONARY MORPH W/CTA COR W/SCORE W/CA W/CM &/OR WO/CM  Addendum Date: 07/23/2020   ADDENDUM REPORT: 07/23/2020 13:44 CLINICAL DATA:  45 year old with EXAM: Cardiac/Coronary  CTA TECHNIQUE: The patient was scanned on a [employer]. FINDINGS: A 100 kV prospective scan was triggered in the descending thoracic aorta at 111 HU's. Axial non-contrast 3 mm slices were carried out through the heart. The data set was analyzed on a dedicated work station and scored using the [employer]. Gantry rotation speed was 250 msecs and collimation was .6 mm. beta blockade and 0.8 mg of sl NTG was given. The 3D data set was reconstructed in 5% intervals of the 67-82 % of the  R-R cycle. Diastolic phases were analyzed on a dedicated work station using MPR, MIP and VRT modes. The patient received 80 cc of contrast. Aorta:  Normal size.  No calcifications.  No dissection. Aortic Valve:  Trileaflet.  No calcifications. Coronary Arteries:  Normal coronary origin.  Right dominance. RCA is a large dominant artery that gives rise to PDA and PLA. There is no plaque. Left main is a large artery that gives rise to LAD and LCX arteries. There is distal focal calcified plaque, 0-24% stenosis. LAD is a large vessel that has mid calcified and non calcified plaque. There is stenosis of 75% mid vessel. Sending for FFR. LCX is a non-dominant artery that gives rise to one large OM1 branch. There is no plaque. Ramus is moderate sized with no plaque. Other findings: Normal pulmonary vein drainage into the left atrium. Normal left atrial appendage without a thrombus. Normal size of the pulmonary artery. Please see radiology report for non cardiac findings. IMPRESSION: 1. Coronary calcium score of 60. This was 54 percentile for age (adjusted for 26) and sex matched control. 2. Normal coronary origin with right dominance. 3.  Distal Left main calcified plaque (0-24%). 4.  Mid LAD 75% stenosis.  Sending for FFR. Electronically Signed   By: Candee Furbish MD   On: 07/23/2020 13:44   Result Date: 07/23/2020 EXAM: OVER-READ INTERPRETATION  CT CHEST The following report is an over-read performed by radiologist Dr. Rolm Baptise of [facility], [place] on 07/23/2020. This over-read does not include interpretation of cardiac or coronary anatomy or pathology. The coronary CTA interpretation by the cardiologist is attached. COMPARISON:  10/30/2015 FINDINGS: Vascular: Heart is normal size. Aorta is normal caliber. Mediastinum/Nodes: No adenopathy Lungs/Pleura: No confluent opacities or effusions. Upper Abdomen: Low-density throughout the visualized liver, likely fatty infiltration. Musculoskeletal: Chest wall soft tissues  are unremarkable. No acute bony abnormality. IMPRESSION: Hepatic steatosis. No acute extra cardiac abnormality. Electronically Signed: By: Rolm Baptise M.D. On: 07/23/2020 11:31   CT CORONARY FRACTIONAL FLOW RESERVE DATA PREP  Result  Date: 07/23/2020 EXAM: FFRCT ANALYSIS - abnormal CT coronary FINDINGS: FFRct analysis was performed on the original cardiac CT angiogram dataset. Diagrammatic representation of the FFRct analysis is provided in a separate PDF document in PACS. This dictation was created using the PDF document and an interactive 3D model of the results. 3D model is not available in the EMR/PACS. Normal FFR range is >0.80. 1. Left Main: normal 2. LAD: Abnormal, mid lesion 0.96 - 0.81 - 0.76 - 0.71 3. LCX: normal 4. Ramus: normal 5. RCA: normal IMPRESSION: 1. Abnormal FFR of mid LAD suggestive of flow limiting CAD. Recommend cardiac catheterization. Note: These examples are not recommendations of [employer] and only provided as examples of what other customers are doing. Electronically Signed   By: Candee Furbish MD   On: 07/23/2020 14:37    Procedures Procedures   Medications Ordered in ED Medications  sodium chloride flush (NS) 0.9 % injection 3 mL (has no administration in time range)  sodium chloride flush (NS) 0.9 % injection 3 mL (has no administration in time range)  0.9 %  sodium chloride infusion (has no administration in time range)  0.9% sodium chloride infusion (has no administration in time range)    Followed by  0.9% sodium chloride infusion (has no administration in time range)    ED Course  I have reviewed the triage vital signs and the nursing notes.  Pertinent labs & imaging results that were available during my care of the patient were reviewed by me and considered in my medical decision making (see chart for details).    MDM Rules/Calculators/A&P                          45 year old male presents the emergency department intermittent chest pain and back pain for  the past month.  Vitals are stable on arrival.  EKG shows no acute ischemic changes.  Recent CTA C yesterday shows moderate disease of the LAD, concern for unstable angina.  Cardiology has evaluated the patient and plan to take him to cath.  Chest x-ray is unremarkable.  Blood work is reassuring, first troponin is 7, flu and COVID is pending.  Lower suspicion for PE/dissection given the chronicity and pain pattern.  Plan for cardiology admission and cath.  Patients evaluation and results requires admission for further treatment and care. Patient agrees with admission plan, offers no new complaints and is stable/unchanged at time of admit.  Final Clinical Impression(s) / ED Diagnoses Final diagnoses:  Angina at rest [facility])    Rx / DC Orders ED Discharge Orders     None        Lorelle Gibbs, DO 07/24/20 [id number]

## 2020-07-24 NOTE — Progress Notes (Addendum)
ANTICOAGULATION CONSULT NOTE - Initial Consult  Pharmacy Consult for Heparin Indication: chest pain/ACS  No Known Allergies  Patient Measurements: Height: 5\' 9"  (175.3 cm) Weight: 98 kg (216 lb) IBW/kg (Calculated) : 70.7 Heparin Dosing Weight: 91.3  Vital Signs: Temp: 98.7 F (37.1 C) (06/10 0800) Temp Source: Oral (06/10 0800) BP: 157/99 (06/10 0800) Pulse Rate: 89 (06/10 0800)  Labs: Recent Labs    07/24/20 0810  HGB 17.4*  HCT 47.9  PLT 155  CREATININE 0.81  TROPONINIHS 7    Estimated Creatinine Clearance: 134.3 mL/min (by C-G formula based on SCr of 0.81 mg/dL).   Medical History: Past Medical History:  Diagnosis Date   Alcohol dependence (H[facility])    Diabetes ([facility])    GERD (gastroesophageal reflux disease)    Hepatic steatosis    Hyperlipidemia    Hypertension    Obesity    Sinus tachycardia    Medications:  (Not in a hospital admission)   Assessment: 13 yom with PMH of HTN, HLD, GERD, and DM presenting to ED with chest pain. Not on AC PTA.  H/H is good. Platelets 155.  Goal of Therapy:  Heparin level 0.3-0.7 units/ml Monitor platelets by anticoagulation protocol: Yes   Plan:  Give 4000 units bolus x 1 Start heparin infusion at 1250 units/hr Check anti-Xa level in 6 hours and daily while on heparin Continue to monitor H&H and platelets  59, PharmD, BCPS 07/24/2020 9:47 AM ED Clinical Pharmacist -  [phone]

## 2020-07-24 NOTE — Interval H&P Note (Signed)
History and Physical Interval Note:  07/24/2020 12:47 PM  Peter Keller  has presented today for surgery, with the diagnosis of unstable angina.  The various methods of treatment have been discussed with the patient and family. After consideration of risks, benefits and other options for treatment, the patient has consented to  Procedure(s): LEFT HEART CATH AND CORONARY ANGIOGRAPHY (N/A) as a surgical intervention.  The patient's history has been reviewed, patient examined, no change in status, stable for surgery.  I have reviewed the patient's chart and labs.  Questions were answered to the patient's satisfaction.    Cath Lab Visit (complete for each Cath Lab visit)  Clinical Evaluation Leading to the Procedure:   ACS: No.  Non-ACS:    Anginal Classification: CCS III  Anti-ischemic medical therapy: Minimal Therapy (1 class of medications)  Non-Invasive Test Results: High-risk stress test findings: cardiac mortality >3%/year (Coronary CTA with severe LAD stenosis). NO stress test  Prior CABG: No previous CABG        Verne Carrow

## 2020-07-24 NOTE — Progress Notes (Signed)
  Echocardiogram 2D Echocardiogram has been performed.  Gerda Diss 07/24/2020, 5:22 PM

## 2020-07-25 DIAGNOSIS — K219 Gastro-esophageal reflux disease without esophagitis: Secondary | ICD-10-CM | POA: Diagnosis present

## 2020-07-25 DIAGNOSIS — Z6831 Body mass index (BMI) 31.0-31.9, adult: Secondary | ICD-10-CM | POA: Diagnosis not present

## 2020-07-25 DIAGNOSIS — E7800 Pure hypercholesterolemia, unspecified: Secondary | ICD-10-CM

## 2020-07-25 DIAGNOSIS — I2510 Atherosclerotic heart disease of native coronary artery without angina pectoris: Secondary | ICD-10-CM

## 2020-07-25 DIAGNOSIS — E1169 Type 2 diabetes mellitus with other specified complication: Secondary | ICD-10-CM | POA: Diagnosis present

## 2020-07-25 DIAGNOSIS — I25110 Atherosclerotic heart disease of native coronary artery with unstable angina pectoris: Secondary | ICD-10-CM | POA: Diagnosis present

## 2020-07-25 DIAGNOSIS — Z20822 Contact with and (suspected) exposure to covid-19: Secondary | ICD-10-CM | POA: Diagnosis present

## 2020-07-25 DIAGNOSIS — I2583 Coronary atherosclerosis due to lipid rich plaque: Secondary | ICD-10-CM

## 2020-07-25 DIAGNOSIS — R079 Chest pain, unspecified: Secondary | ICD-10-CM | POA: Diagnosis not present

## 2020-07-25 DIAGNOSIS — Z7984 Long term (current) use of oral hypoglycemic drugs: Secondary | ICD-10-CM | POA: Diagnosis not present

## 2020-07-25 DIAGNOSIS — Z8249 Family history of ischemic heart disease and other diseases of the circulatory system: Secondary | ICD-10-CM | POA: Diagnosis not present

## 2020-07-25 DIAGNOSIS — F1020 Alcohol dependence, uncomplicated: Secondary | ICD-10-CM | POA: Diagnosis present

## 2020-07-25 DIAGNOSIS — F17290 Nicotine dependence, other tobacco product, uncomplicated: Secondary | ICD-10-CM | POA: Diagnosis present

## 2020-07-25 DIAGNOSIS — Z79899 Other long term (current) drug therapy: Secondary | ICD-10-CM | POA: Diagnosis not present

## 2020-07-25 DIAGNOSIS — Z833 Family history of diabetes mellitus: Secondary | ICD-10-CM | POA: Diagnosis not present

## 2020-07-25 DIAGNOSIS — E785 Hyperlipidemia, unspecified: Secondary | ICD-10-CM | POA: Diagnosis present

## 2020-07-25 DIAGNOSIS — K760 Fatty (change of) liver, not elsewhere classified: Secondary | ICD-10-CM | POA: Diagnosis present

## 2020-07-25 DIAGNOSIS — I10 Essential (primary) hypertension: Secondary | ICD-10-CM | POA: Diagnosis present

## 2020-07-25 DIAGNOSIS — I200 Unstable angina: Secondary | ICD-10-CM | POA: Diagnosis present

## 2020-07-25 DIAGNOSIS — E669 Obesity, unspecified: Secondary | ICD-10-CM | POA: Diagnosis present

## 2020-07-25 LAB — HIV ANTIBODY (ROUTINE TESTING W REFLEX): HIV Screen 4th Generation wRfx: NONREACTIVE

## 2020-07-25 LAB — BASIC METABOLIC PANEL
Anion gap: 7 (ref 5–15)
BUN: 13 mg/dL (ref 6–20)
CO2: 23 mmol/L (ref 22–32)
Calcium: 8.6 mg/dL — ABNORMAL LOW (ref 8.9–10.3)
Chloride: 108 mmol/L (ref 98–111)
Creatinine, Ser: 0.84 mg/dL (ref 0.61–1.24)
GFR, Estimated: >60 mL/min (ref >60)
Glucose, Bld: 143 mg/dL — ABNORMAL HIGH (ref 70–99)
Potassium: 4.1 mmol/L (ref 3.5–5.1)
Sodium: 138 mmol/L (ref 135–145)

## 2020-07-25 LAB — CBC
HCT: 43.1 % (ref 39.0–52.0)
Hemoglobin: 15.7 g/dL (ref 13.0–17.0)
MCH: 32.4 pg (ref 26.0–34.0)
MCHC: 36.4 g/dL — ABNORMAL HIGH (ref 30.0–36.0)
MCV: 88.9 fL (ref 80.0–100.0)
Platelets: 126 10*3/uL — ABNORMAL LOW (ref 150–400)
RBC: 4.85 MIL/uL (ref 4.22–5.81)
RDW: 12 % (ref 11.5–15.5)
WBC: 4.7 10*3/uL (ref 4.0–10.5)
nRBC: 0 % (ref 0.0–0.2)

## 2020-07-25 LAB — LIPID PANEL
Cholesterol: 155 mg/dL (ref 0–200)
HDL: 28 mg/dL — ABNORMAL LOW (ref >40)
LDL Cholesterol: UNDETERMINED mg/dL (ref 0–99)
Total CHOL/HDL Ratio: 5.5 RATIO
Triglycerides: 434 mg/dL — ABNORMAL HIGH (ref <150)
VLDL: UNDETERMINED mg/dL (ref 0–40)

## 2020-07-25 LAB — GLUCOSE, CAPILLARY
Glucose-Capillary: 167 mg/dL — ABNORMAL HIGH (ref 70–99)
Glucose-Capillary: 192 mg/dL — ABNORMAL HIGH (ref 70–99)

## 2020-07-25 LAB — LDL CHOLESTEROL, DIRECT: Direct LDL: 64.7 mg/dL (ref 0–99)

## 2020-07-25 MED ORDER — FENOFIBRATE 160 MG PO TABS
160.0000 mg | ORAL_TABLET | Freq: Every day | ORAL | 3 refills | Status: DC
  Filled 2021-03-05: qty 90, 90d supply, fill #0
  Filled 2021-05-31: qty 90, 90d supply, fill #1

## 2020-07-25 MED ORDER — TICAGRELOR 90 MG PO TABS: 90.0000 mg | ORAL_TABLET | Freq: Two times a day (BID) | ORAL | 3 refills | Status: DC

## 2020-07-25 MED ORDER — NEBIVOLOL HCL 10 MG PO TABS
10.0000 mg | ORAL_TABLET | Freq: Every day | ORAL | 3 refills | Status: DC
  Filled 2021-03-02: qty 90, 90d supply, fill #0

## 2020-07-25 MED ORDER — ATORVASTATIN CALCIUM 40 MG PO TABS
40.0000 mg | ORAL_TABLET | Freq: Every day | ORAL | 3 refills | Status: DC
  Filled 2021-03-02: qty 60, 60d supply, fill #0

## 2020-07-25 MED ORDER — NITROGLYCERIN 0.4 MG SL SUBL: 0.4000 mg | SUBLINGUAL_TABLET | SUBLINGUAL | 3 refills | Status: DC | PRN

## 2020-07-25 MED ORDER — ASPIRIN 81 MG PO TBEC
81.0000 mg | DELAYED_RELEASE_TABLET | Freq: Every day | ORAL | 3 refills | Status: DC
  Filled 2021-03-02: qty 90, 90d supply, fill #0

## 2020-07-25 NOTE — Discharge Summary (Signed)
Discharge Summary    Patient ID: Peter Keller MRN: [id number]; DOB: Apr 07, 1975  Admit date: 07/24/2020 Discharge date: 07/25/2020  PCP:  Hoyt Koch, MD   [facility] Providers Cardiologist:  None        Discharge Diagnoses    Principal Problem:   Unstable angina [facility]) Active Problems:   GERD   Alcohol dependence ([place])   Hyperlipidemia associated with type 2 diabetes mellitus ([place])   Essential hypertension, benign   Coronary artery disease    Diagnostic Studies/Procedures    Cardiac cath 07/24/2020 Conclusion     Ramus lesion is 30% stenosed. Prox LAD lesion is 80% stenosed. A drug-eluting stent was successfully placed using a SYNERGY XD 3.0X16. Post intervention, there is a 0% residual stenosis. Dist LAD lesion is 30% stenosed. Ost LAD to Prox LAD lesion is 30% stenosed. The left ventricular ejection fraction is 45-50% by visual estimate. There is no mitral valve regurgitation. LV end diastolic pressure is normal. There is mild left ventricular systolic dysfunction.   1. Severe stenosis mid LAD 2. Successful PTCA/DES x 1 mid LAD 3. Mild non-obstructive disease in the small ramus intermediate branch and in the distal LAD. 4. Overall preserved LV systolic function with subtle hypokinesis of the anterior wall but poor opacification of the LV. Further assessment with echo. Intervention      2D echo 07/24/2020 IMPRESSIONS     1. Left ventricular ejection fraction, by estimation, is 60 to 65%. The  left ventricle has normal function. The left ventricle has no regional  wall motion abnormalities. Left ventricular diastolic parameters were  normal.   2. Right ventricular systolic function is normal. The right ventricular  size is normal. Tricuspid regurgitation signal is inadequate for assessing  PA pressure.   3. The mitral valve is normal in structure. No evidence of mitral valve  regurgitation. No evidence of mitral stenosis.   4. The aortic  valve is normal in structure. Aortic valve regurgitation is  not visualized. No aortic stenosis is present.   5. Aortic dilatation noted. There is mild dilatation of the ascending  aorta, measuring 38 mm. _____________   History of Present Illness     Peter Keller is a 45 y.o. male with a PMH of hypertension, hyperlipidemia, DM type II, GERD, alcohol abuse, and tobacco abuse who presented with complaints of chest pain concerning for unstable angina.  He is followed by Dr. Lovena Le outpatient and recently seen 05/2020 at which time he reported exertional chest pain.  Coronary CTA was ordered, performed 07/23/2020 showing 75% stenosis mid LAD with positive FFR.  Patient had recurrent chest pain following the procedure therefore presented to the ED for further evaluation.  Patient reported right upper back/shoulder pain radiating to his neck sometimes right side of his chest.  He stated this has been constant for the past couple of months.  Also reported intermittent 5 out of 10 left-sided chest pain at rest and with exertion.  This has been occurring more frequently.  Also with new dyspnea on exertion.  Also described possible orthopnea, but no PND or edema.  In the ED, patient hypertensive but otherwise vitals stable.  EKG showed no acute ischemic changes.  Troponins pending at the time of admission.  Patient was chest pain-free at the time of admission but did have complaints of right upper back/shoulder pain.  Patient reported smoking 1 to 2 cigars/week.  Also with daily alcohol use, drinking 1 bottle of wine daily.  States he quit drinking for  1 month and December 2021 however started drinking again.  He denied withdrawal symptoms when he quit.  He denies recreational drug use.  Hospital Course     Consultants: None  Unstable angina in patient with CAD: patient underwent coronary CTA 07/24/20 which showed 75% mLAD stenosis with positive FFR. He had recurrent chest pain prompting him to present to  the ED. HsTrop negative x2. EKG non-ischemic. He underwent LHC which showed 80% pLAD stenosis managed with PCI/DES, as well as 30% ostial and distal LAD stenosis, and 30% ramus stenosis which were medically managed. He underwent an echocardiogram which showed EF 60-65%, no RWMA, normal LV diastolic function, mild dilation of the ascending aorta (27m), and no significant valvular abnormalities. He was initially started on plavix, though transitioned to brilinta in addition to aspirin for DAPT.  - Continue aspirin and Brilinta (instructions to load with 2 tablets with first dose reviewed with patient) - Continue statin - Continue Bblocker - Rx sent for SL nitro prn  HTN: BP generally elevated this admission. Home losartan-HCTZ held in anticipation of cath. Home nebivolol increased to 165mdaily - Continue nebivolol. - Resume home losartan-HCTZ at discharge  HLD: Unable to calculate LDL due to elevated triglycerides 434 this admission; LDL 83 02/2020. Atorvastatin increased to 4011maily and fenofibrate increased to 160m35mily - Continue atorvastatin and fenofibrate  - Would repeat FLP/LFTs in 6-8 weeks; if LDL not at goal of <70, would increase atorvastatin further  DM type 2: A1C 8.2 this admission; goal <7 - Encouraged patient to take metformin as prescribed, 1000mg23m (was taking daily) to start 07/27/20 following cath - Continue jardiance and Trulicity  ETOH abuse: drinking 1 bottle of wine daily. No history of withdrawal or DT's with cessation in the past.  - Continue to encourage cutting back/stopping ETOH  Tobacco abuse: smoking 1-2 cigars per week - Continue to encourage cessation   Did the patient have an acute coronary syndrome (MI, NSTEMI, STEMI, etc) this admission?:  No                               Did the patient have a percutaneous coronary intervention (stent / angioplasty)?:  Yes.     Cath/PCI Registry Performance & Quality Measures: Aspirin prescribed? - Yes ADP  Receptor Inhibitor (Plavix/Clopidogrel, Brilinta/Ticagrelor or Effient/Prasugrel) prescribed (includes medically managed patients)? - Yes High Intensity Statin (Lipitor 40-80mg 42mrestor 20-40mg) 11mcribed? - Yes For EF <40%, was ACEI/ARB prescribed? - Not Applicable (EF >/= 40%) Fo48%F <40%, Aldosterone Antagonist (Spironolactone or Eplerenone) prescribed? - Not Applicable (EF >/= 40%) Ca54%ac Rehab Phase II ordered? - Yes      _____________  Discharge Vitals Blood pressure (!) 144/96, pulse 84, temperature 98.7 F (37.1 C), temperature source Oral, resp. rate 16, height _0  (1.753 m), weight 98 kg, SpO2 99 %.  Filed Weights   07/24/20 0938  Weight: 98 kg    Labs & Radiologic Studies    CBC Recent Labs    07/24/20 0810 07/25/20 0711  WBC 4.5 4.7  HGB 17.4* 15.7  HCT 47.9 43.1  MCV 89.5 88.9  PLT 155 126*   627ic Metabolic Panel Recent Labs    07/24/20 0810 07/25/20 0711  NA 140 138  K 4.0 4.1  CL 102 108  CO2 26 23  GLUCOSE 171* 143*  BUN 13 13  CREATININE 0.81 0.84  CALCIUM 9.2 8.6*   Liver Function Tests No  results for input(s): AST, ALT, ALKPHOS, BILITOT, PROT, ALBUMIN in the last 72 hours. No results for input(s): LIPASE, AMYLASE in the last 72 hours. High Sensitivity Troponin:   Recent Labs  Lab 07/24/20 0810 07/24/20 1028  TROPONINIHS 7 7    BNP Invalid input(s): POCBNP D-Dimer No results for input(s): DDIMER in the last 72 hours. Hemoglobin A1C Recent Labs    07/24/20 1028  HGBA1C 8.2*   Fasting Lipid Panel Recent Labs    07/25/20 0711  CHOL 155  HDL 28*  LDLCALC UNABLE TO CALCULATE IF TRIGLYCERIDE OVER 400 mg/dL  TRIG 434*  CHOLHDL 5.5   Thyroid Function Tests No results for input(s): TSH, T4TOTAL, T3FREE, THYROIDAB in the last 72 hours.  Invalid input(s): FREET3 _____________  DG Chest 2 View  Result Date: 07/24/2020 CLINICAL DATA:  Chest pain and shortness of breath EXAM: CHEST - 2 VIEW COMPARISON:  November 10, 2018  FINDINGS: Lungs are clear. Heart size and pulmonary vascularity are normal. No adenopathy. No pneumothorax. No bone lesions. IMPRESSION: Lungs clear.  Cardiac silhouette normal. Electronically Signed   By: Lowella Grip III M.D.   On: 07/24/2020 08:37   CARDIAC CATHETERIZATION  Result Date: 07/24/2020  Ramus lesion is 30% stenosed.  Prox LAD lesion is 80% stenosed.  A drug-eluting stent was successfully placed using a SYNERGY XD 3.0X16.  Post intervention, there is a 0% residual stenosis.  Dist LAD lesion is 30% stenosed.  Ost LAD to Prox LAD lesion is 30% stenosed.  The left ventricular ejection fraction is 45-50% by visual estimate.  There is no mitral valve regurgitation.  LV end diastolic pressure is normal.  There is mild left ventricular systolic dysfunction.  1. Severe stenosis mid LAD 2. Successful PTCA/DES x 1 mid LAD 3. Mild non-obstructive disease in the small ramus intermediate branch and in the distal LAD. 4. Overall preserved LV systolic function with subtle hypokinesis of the anterior wall but poor opacification of the LV. Further assessment with echo. Recommendations: Will continue DAPT with ASA and Plavix for at least six months. Continue statin and beta blocker.   CT CORONARY MORPH W/CTA COR W/SCORE W/CA W/CM &/OR WO/CM  Addendum Date: 07/23/2020   ADDENDUM REPORT: 07/23/2020 13:44 CLINICAL DATA:  45 year old with EXAM: Cardiac/Coronary  CTA TECHNIQUE: The patient was scanned on a [employer]. FINDINGS: A 100 kV prospective scan was triggered in the descending thoracic aorta at 111 HU's. Axial non-contrast 3 mm slices were carried out through the heart. The data set was analyzed on a dedicated work station and scored using the [employer]. Gantry rotation speed was 250 msecs and collimation was .6 mm. beta blockade and 0.8 mg of sl NTG was given. The 3D data set was reconstructed in 5% intervals of the 67-82 % of the R-R cycle. Diastolic phases were analyzed on a  dedicated work station using MPR, MIP and VRT modes. The patient received 80 cc of contrast. Aorta:  Normal size.  No calcifications.  No dissection. Aortic Valve:  Trileaflet.  No calcifications. Coronary Arteries:  Normal coronary origin.  Right dominance. RCA is a large dominant artery that gives rise to PDA and PLA. There is no plaque. Left main is a large artery that gives rise to LAD and LCX arteries. There is distal focal calcified plaque, 0-24% stenosis. LAD is a large vessel that has mid calcified and non calcified plaque. There is stenosis of 75% mid vessel. Sending for FFR. LCX is a non-dominant artery that  gives rise to one large OM1 branch. There is no plaque. Ramus is moderate sized with no plaque. Other findings: Normal pulmonary vein drainage into the left atrium. Normal left atrial appendage without a thrombus. Normal size of the pulmonary artery. Please see radiology report for non cardiac findings. IMPRESSION: 1. Coronary calcium score of 60. This was 69 percentile for age (adjusted for 15) and sex matched control. 2. Normal coronary origin with right dominance. 3.  Distal Left main calcified plaque (0-24%). 4.  Mid LAD 75% stenosis.  Sending for FFR. Electronically Signed   By: Candee Furbish MD   On: 07/23/2020 13:44   Result Date: 07/23/2020 EXAM: OVER-READ INTERPRETATION  CT CHEST The following report is an over-read performed by radiologist Dr. Rolm Baptise of [facility], [place] on 07/23/2020. This over-read does not include interpretation of cardiac or coronary anatomy or pathology. The coronary CTA interpretation by the cardiologist is attached. COMPARISON:  10/30/2015 FINDINGS: Vascular: Heart is normal size. Aorta is normal caliber. Mediastinum/Nodes: No adenopathy Lungs/Pleura: No confluent opacities or effusions. Upper Abdomen: Low-density throughout the visualized liver, likely fatty infiltration. Musculoskeletal: Chest wall soft tissues are unremarkable. No acute bony abnormality.  IMPRESSION: Hepatic steatosis. No acute extra cardiac abnormality. Electronically Signed: By: Rolm Baptise M.D. On: 07/23/2020 11:31   CT CORONARY FRACTIONAL FLOW RESERVE DATA PREP  Result Date: 07/23/2020 EXAM: FFRCT ANALYSIS - abnormal CT coronary FINDINGS: FFRct analysis was performed on the original cardiac CT angiogram dataset. Diagrammatic representation of the FFRct analysis is provided in a separate PDF document in PACS. This dictation was created using the PDF document and an interactive 3D model of the results. 3D model is not available in the EMR/PACS. Normal FFR range is >0.80. 1. Left Main: normal 2. LAD: Abnormal, mid lesion 0.96 - 0.81 - 0.76 - 0.71 3. LCX: normal 4. Ramus: normal 5. RCA: normal IMPRESSION: 1. Abnormal FFR of mid LAD suggestive of flow limiting CAD. Recommend cardiac catheterization. Note: These examples are not recommendations of [employer] and only provided as examples of what other customers are doing. Electronically Signed   By: Candee Furbish MD   On: 07/23/2020 14:37   ECHOCARDIOGRAM COMPLETE  Result Date: 07/24/2020    ECHOCARDIOGRAM REPORT   Patient Name:   Peter Keller Date of Exam: 07/24/2020 Medical Rec #:  [id number]    Height:       69.0 in Accession #:    [id number]   Weight:       216.0 lb Date of Birth:  August 22, 1975     BSA:          2.135 m Patient Age:    50 years     BP:           143/86 mmHg Patient Gender: M            HR:           83 bpm. Exam Location:  Inpatient Procedure: 2D Echo, Cardiac Doppler, Color Doppler and Intracardiac            Opacification Agent Indications:    Chest pain  History:        Patient has prior history of Echocardiogram examinations, most                 recent 02/03/2017. Risk Factors:Hypertension, Diabetes and                 Dyslipidemia.  Sonographer:    Clayton Lefort RDCS (AE) Referring Phys:  [id number] CALLIE E GOODRICH  Sonographer Comments: No subcostal window. IMPRESSIONS  1. Left ventricular ejection fraction, by estimation, is 60  to 65%. The left ventricle has normal function. The left ventricle has no regional wall motion abnormalities. Left ventricular diastolic parameters were normal.  2. Right ventricular systolic function is normal. The right ventricular size is normal. Tricuspid regurgitation signal is inadequate for assessing PA pressure.  3. The mitral valve is normal in structure. No evidence of mitral valve regurgitation. No evidence of mitral stenosis.  4. The aortic valve is normal in structure. Aortic valve regurgitation is not visualized. No aortic stenosis is present.  5. Aortic dilatation noted. There is mild dilatation of the ascending aorta, measuring 38 mm. FINDINGS  Left Ventricle: Left ventricular ejection fraction, by estimation, is 60 to 65%. The left ventricle has normal function. The left ventricle has no regional wall motion abnormalities. Definity contrast agent was given IV to delineate the left ventricular  endocardial borders. The left ventricular internal cavity size was normal in size. There is no left ventricular hypertrophy. Left ventricular diastolic parameters were normal. Normal left ventricular filling pressure. Right Ventricle: The right ventricular size is normal. No increase in right ventricular wall thickness. Right ventricular systolic function is normal. Tricuspid regurgitation signal is inadequate for assessing PA pressure. Left Atrium: Left atrial size was normal in size. Right Atrium: Right atrial size was normal in size. Pericardium: There is no evidence of pericardial effusion. Mitral Valve: The mitral valve is normal in structure. No evidence of mitral valve regurgitation. No evidence of mitral valve stenosis. MV peak gradient, 2.8 mmHg. The mean mitral valve gradient is 1.0 mmHg. Tricuspid Valve: The tricuspid valve is normal in structure. Tricuspid valve regurgitation is not demonstrated. No evidence of tricuspid stenosis. Aortic Valve: The aortic valve is normal in structure. Aortic valve  regurgitation is not visualized. No aortic stenosis is present. Aortic valve mean gradient measures 2.0 mmHg. Aortic valve peak gradient measures 3.6 mmHg. Aortic valve area, by VTI measures 4.30 cm. Pulmonic Valve: The pulmonic valve was normal in structure. Pulmonic valve regurgitation is not visualized. No evidence of pulmonic stenosis. Aorta: Aortic dilatation noted. There is mild dilatation of the ascending aorta, measuring 38 mm. Venous: The inferior vena cava was not well visualized. IAS/Shunts: No atrial level shunt detected by color flow Doppler.  LEFT VENTRICLE PLAX 2D LVIDd:         4.70 cm  Diastology LVIDs:         3.20 cm  LV e' medial:    8.59 cm/s LV PW:         1.00 cm  LV E/e' medial:  8.5 LV IVS:        1.00 cm  LV e' lateral:   9.14 cm/s LVOT diam:     2.20 cm  LV E/e' lateral: 8.0 LV SV:         73 LV SV Index:   34 LVOT Area:     3.80 cm  RIGHT VENTRICLE RV Basal diam:  2.30 cm RV S prime:     10.90 cm/s TAPSE (M-mode): 2.0 cm LEFT ATRIUM             Index       RIGHT ATRIUM           Index LA diam:        3.20 cm 1.50 cm/m  RA Area:     11.00 cm LA Vol (A2C):   48.8 ml 22.86  ml/m RA Volume:   22.60 ml  10.59 ml/m LA Vol (A4C):   32.5 ml 15.22 ml/m LA Biplane Vol: 41.1 ml 19.25 ml/m  AORTIC VALVE AV Area (Vmax):    3.59 cm AV Area (Vmean):   2.92 cm AV Area (VTI):     4.30 cm AV Vmax:           95.40 cm/s AV Vmean:          70.900 cm/s AV VTI:            0.169 m AV Peak Grad:      3.6 mmHg AV Mean Grad:      2.0 mmHg LVOT Vmax:         90.10 cm/s LVOT Vmean:        54.500 cm/s LVOT VTI:          0.191 m LVOT/AV VTI ratio: 1.13  AORTA Ao Root diam: 3.80 cm Ao Asc diam:  3.20 cm MITRAL VALVE MV Area (PHT): 3.99 cm    SHUNTS MV Area VTI:   3.84 cm    Systemic VTI:  0.19 m MV Peak grad:  2.8 mmHg    Systemic Diam: 2.20 cm MV Mean grad:  1.0 mmHg MV Vmax:       0.84 m/s MV Vmean:      53.8 cm/s MV Decel Time: 190 msec MV E velocity: 72.80 cm/s MV A velocity: 65.10 cm/s MV E/A ratio:   1.12 Fransico Him MD Electronically signed by Fransico Him MD Signature Date/Time: 07/24/2020/5:46:02 PM    Final    Disposition   Pt is being discharged home today in good condition.  Follow-up Plans & Appointments     Follow-up Information     Deberah Pelton, NP Follow up on 08/04/2020.   Specialty: Cardiology Why: Please arrive 15 minutes early for your 11:15 am post-hospital cardiology appointment Contact information: [address] [place] [address] [phone]                Discharge Instructions     Amb Referral to Cardiac Rehabilitation   Complete by: As directed    Diagnosis: Coronary Stents   After initial evaluation and assessments completed: Virtual Based Care may be provided alone or in conjunction with Phase 2 Cardiac Rehab based on patient barriers.: Yes       Discharge Medications   Allergies as of 07/25/2020   No Known Allergies      Medication List     STOP taking these medications    buPROPion 150 MG 24 hr tablet Commonly known as: WELLBUTRIN XL   metoprolol tartrate 100 MG tablet Commonly known as: LOPRESSOR   nystatin-triamcinolone ointment Commonly known as: MYCOLOG       TAKE these medications    aspirin 81 MG EC tablet Take 1 tablet (81 mg total) by mouth daily. Swallow whole.   atorvastatin 40 MG tablet Commonly known as: LIPITOR Take 1 tablet (40 mg total) by mouth daily. What changed:  medication strength how much to take   esomeprazole 20 MG capsule Commonly known as: NEXIUM Take 20 mg by mouth daily at 12 noon.   fenofibrate 160 MG tablet Take 1 tablet (160 mg total) by mouth daily. What changed:  medication strength how much to take   FreeStyle Libre 14 Day Reader Kerrin Mo USE AS DIRECTED BY YOUR DOCTOR FOR 1 DOSE   FreeStyle Libre 14 Day Sensor Misc USE EVERY 14 DAYS AND CHANGE EVERY  2 WEEKS   Jardiance 25 MG Tabs tablet Generic drug: empagliflozin Take 25 mg by mouth daily.    losartan-hydrochlorothiazide 100-25 MG tablet Commonly known as: HYZAAR Take 1 tablet by mouth daily.   metFORMIN 1000 MG tablet Commonly known as: GLUCOPHAGE Take 1 tablet (1,000 mg total) by mouth 2 (two) times daily with a meal. What changed: when to take this Notes to patient: RESTART 07/27/20 as prescribed   nebivolol 10 MG tablet Commonly known as: BYSTOLIC Take 1 tablet (10 mg total) by mouth daily. What changed: medication strength   nitroGLYCERIN 0.4 MG SL tablet Commonly known as: NITROSTAT Place 1 tablet (0.4 mg total) under the tongue every 5 (five) minutes x 3 doses as needed for chest pain.   OneTouch Delica Lancets 39P Misc USE TO OBTAIN A BLOOD SPECIMEN ONCE PER DAY   OneTouch Verio IQ System w/Device Kit Use as directed   OneTouch Verio test strip Generic drug: glucose blood USE AS INSTRUCTED TO CHECK BLOOD SUGAR 1x DAILY   ticagrelor 90 MG Tabs tablet Commonly known as: Brilinta Take 1 tablet (90 mg total) by mouth 2 (two) times daily. Take 2 tablets for your first dose (15m x1), then 1 tablet that evening. Resume 1 tablet 2 times daily going forward. Start taking on: June 12, 259[address]  Trulicity 3 MNO/5.0KHSopn Generic drug: Dulaglutide Inject 3 mg into the skin once a week.   Vitamin D 400 units capsule Take 500 Units by mouth daily.           Outstanding Labs/Studies   None  Duration of Discharge Encounter   Greater than 30 minutes including physician time.  Signed, KAbigail Butts PA-C 07/25/2020, 9:54 AM

## 2020-07-25 NOTE — Plan of Care (Signed)
  Problem: Cardiovascular: Goal: Ability to achieve and maintain adequate cardiovascular perfusion will improve Outcome: Progressing Goal: Vascular access site(s) Level 0-1 will be maintained Outcome: Progressing   

## 2020-07-25 NOTE — Progress Notes (Signed)
CARDIAC REHAB PHASE I   PRE:  Rate/Rhythm: 81 SR  BP:  Sitting: 160/88       MODE:  Ambulation: 470 ft   POST:  Rate/Rhythm:   BP:  Sitting: 163/93   Pt's RN was present at bedside as pt was complaining of upper back pain. He agreed to walk while RN got him heating pads. Pt was independent getting out of bed and ambulated 416ft on RA with no complaints or concerns. Pt had slow but steady gait. No CP or SOB. Returned pt seated in chair. Discussed stent site precautions, stent card, diabetic diet, heart healthy diet, exercise guidelines, ASA and Brilinta, and CRP II. Pt was receptive. Will send referral to [place] CRPII. Pt asked for RN to assist with heating pads. Reported to NT.  [id number]  Norris Cross, MS, CEP 07/25/2020 9:03 AM

## 2020-07-25 NOTE — Progress Notes (Signed)
Progress Note  Patient Name: Peter Keller Date of Encounter: 07/25/2020  [facility] Cardiologist: None   Subjective   Denies any chest pain or SOB.  Cath yesterday showed 80% pLAD s/p PCI of LAD with mild non obstructive dz in small Ramus and distal LAD of dLAD of 30%.    Inpatient Medications    Scheduled Meds:  aspirin EC  81 mg Oral Daily   atorvastatin  40 mg Oral Daily   clopidogrel  75 mg Oral Q breakfast   empagliflozin  25 mg Oral Daily   fenofibrate  160 mg Oral Daily   insulin aspart  0-15 Units Subcutaneous TID WC   insulin aspart  0-5 Units Subcutaneous QHS   nebivolol  10 mg Oral Daily   pantoprazole  40 mg Oral Daily   sodium chloride flush  3 mL Intravenous Q12H   sodium chloride flush  3 mL Intravenous Q12H   Continuous Infusions:  sodium chloride     sodium chloride Stopped (07/25/20 0030)   sodium chloride     sodium chloride Stopped (07/24/20 1900)   PRN Meds: sodium chloride, sodium chloride, acetaminophen, melatonin, nitroGLYCERIN, ondansetron (ZOFRAN) IV, sodium chloride flush, sodium chloride flush   Vital Signs    Vitals:   07/24/20 1930 07/24/20 2006 07/25/20 0028 07/25/20 0414  BP: (!) 153/89 (!) 152/99 (!) 149/99 (!) 144/96  Pulse: 89 93 76 84  Resp:  18 16 16   Temp:  98.2 F (36.8 C) 98.2 F (36.8 C) 98.7 F (37.1 C)  TempSrc:  Oral Oral Oral  SpO2: 96% 96% 99% 99%  Weight:      Height:        Intake/Output Summary (Last 24 hours) at 07/25/2020 0736 Last data filed at 07/25/2020 0500 Gross per 24 hour  Intake 1656.89 ml  Output --  Net 1656.89 ml   Last 3 Weights 07/24/2020 06/05/2020 03/06/2020  Weight (lbs) 216 lb 216 lb 3.2 oz 203 lb 6.4 oz  Weight (kg) 97.977 kg 98.068 kg 92.262 kg      Telemetry    NSR - Personally Reviewed  ECG    NSR with no ST changes - Personally Reviewed  Physical Exam   GEN: No acute distress.   Neck: No JVD Cardiac: RRR, no murmurs, rubs, or gallops.  Respiratory: Clear to  auscultation bilaterally. GI: Soft, nontender, non-distended  MS: No edema; No deformity. Neuro:  Nonfocal  Psych: Normal affect   Labs    High Sensitivity Troponin:   Recent Labs  Lab 07/24/20 0810 07/24/20 1028  TROPONINIHS 7 7      Chemistry Recent Labs  Lab 07/20/20 1513 07/24/20 0810  NA 144 140  K 4.0 4.0  CL 104 102  CO2 19* 26  GLUCOSE 140* 171*  BUN 14 13  CREATININE 0.90 0.81  CALCIUM 9.3 9.2  GFRNONAA  --  >60  ANIONGAP  --  12     Hematology Recent Labs  Lab 07/24/20 0810  WBC 4.5  RBC 5.35  HGB 17.4*  HCT 47.9  MCV 89.5  MCH 32.5  MCHC 36.3*  RDW 12.0  PLT 155    BNPNo results for input(s): BNP, PROBNP in the last 168 hours.   DDimer No results for input(s): DDIMER in the last 168 hours.   Radiology    DG Chest 2 View  Result Date: 07/24/2020 CLINICAL DATA:  Chest pain and shortness of breath EXAM: CHEST - 2 VIEW COMPARISON:  November 10, 2018 FINDINGS:  Lungs are clear. Heart size and pulmonary vascularity are normal. No adenopathy. No pneumothorax. No bone lesions. IMPRESSION: Lungs clear.  Cardiac silhouette normal. Electronically Signed   By: Bretta Bang III M.D.   On: 07/24/2020 08:37   CARDIAC CATHETERIZATION  Result Date: 07/24/2020  Ramus lesion is 30% stenosed.  Prox LAD lesion is 80% stenosed.  A drug-eluting stent was successfully placed using a SYNERGY XD 3.0X16.  Post intervention, there is a 0% residual stenosis.  Dist LAD lesion is 30% stenosed.  Ost LAD to Prox LAD lesion is 30% stenosed.  The left ventricular ejection fraction is 45-50% by visual estimate.  There is no mitral valve regurgitation.  LV end diastolic pressure is normal.  There is mild left ventricular systolic dysfunction.  1. Severe stenosis mid LAD 2. Successful PTCA/DES x 1 mid LAD 3. Mild non-obstructive disease in the small ramus intermediate branch and in the distal LAD. 4. Overall preserved LV systolic function with subtle hypokinesis of  the anterior wall but poor opacification of the LV. Further assessment with echo. Recommendations: Will continue DAPT with ASA and Plavix for at least six months. Continue statin and beta blocker.   CT CORONARY MORPH W/CTA COR W/SCORE W/CA W/CM &/OR WO/CM  Addendum Date: 07/23/2020   ADDENDUM REPORT: 07/23/2020 13:44 CLINICAL DATA:  45 year old with EXAM: Cardiac/Coronary  CTA TECHNIQUE: The patient was scanned on a [employer]. FINDINGS: A 100 kV prospective scan was triggered in the descending thoracic aorta at 111 HU's. Axial non-contrast 3 mm slices were carried out through the heart. The data set was analyzed on a dedicated work station and scored using the Agatson method. Gantry rotation speed was 250 msecs and collimation was .6 mm. beta blockade and 0.8 mg of sl NTG was given. The 3D data set was reconstructed in 5% intervals of the 67-82 % of the R-R cycle. Diastolic phases were analyzed on a dedicated work station using MPR, MIP and VRT modes. The patient received 80 cc of contrast. Aorta:  Normal size.  No calcifications.  No dissection. Aortic Valve:  Trileaflet.  No calcifications. Coronary Arteries:  Normal coronary origin.  Right dominance. RCA is a large dominant artery that gives rise to PDA and PLA. There is no plaque. Left main is a large artery that gives rise to LAD and LCX arteries. There is distal focal calcified plaque, 0-24% stenosis. LAD is a large vessel that has mid calcified and non calcified plaque. There is stenosis of 75% mid vessel. Sending for FFR. LCX is a non-dominant artery that gives rise to one large OM1 branch. There is no plaque. Ramus is moderate sized with no plaque. Other findings: Normal pulmonary vein drainage into the left atrium. Normal left atrial appendage without a thrombus. Normal size of the pulmonary artery. Please see radiology report for non cardiac findings. IMPRESSION: 1. Coronary calcium score of 60. This was 42 percentile for age (adjusted for  78) and sex matched control. 2. Normal coronary origin with right dominance. 3.  Distal Left main calcified plaque (0-24%). 4.  Mid LAD 75% stenosis.  Sending for FFR. Electronically Signed   By: Donato Schultz MD   On: 07/23/2020 13:44   Result Date: 07/23/2020 EXAM: OVER-READ INTERPRETATION  CT CHEST The following report is an over-read performed by radiologist Dr. Charlett Nose of [facility] on 07/23/2020. This over-read does not include interpretation of cardiac or coronary anatomy or pathology. The coronary CTA interpretation by the cardiologist is  attached. COMPARISON:  10/30/2015 FINDINGS: Vascular: Heart is normal size. Aorta is normal caliber. Mediastinum/Nodes: No adenopathy Lungs/Pleura: No confluent opacities or effusions. Upper Abdomen: Low-density throughout the visualized liver, likely fatty infiltration. Musculoskeletal: Chest wall soft tissues are unremarkable. No acute bony abnormality. IMPRESSION: Hepatic steatosis. No acute extra cardiac abnormality. Electronically Signed: By: Charlett Nose M.D. On: 07/23/2020 11:31   CT CORONARY FRACTIONAL FLOW RESERVE DATA PREP  Result Date: 07/23/2020 EXAM: FFRCT ANALYSIS - abnormal CT coronary FINDINGS: FFRct analysis was performed on the original cardiac CT angiogram dataset. Diagrammatic representation of the FFRct analysis is provided in a separate PDF document in PACS. This dictation was created using the PDF document and an interactive 3D model of the results. 3D model is not available in the EMR/PACS. Normal FFR range is >0.80. 1. Left Main: normal 2. LAD: Abnormal, mid lesion 0.96 - 0.81 - 0.76 - 0.71 3. LCX: normal 4. Ramus: normal 5. RCA: normal IMPRESSION: 1. Abnormal FFR of mid LAD suggestive of flow limiting CAD. Recommend cardiac catheterization. Note: These examples are not recommendations of [employer] and only provided as examples of what other customers are doing. Electronically Signed   By: Donato Schultz MD   On: 07/23/2020 14:37    ECHOCARDIOGRAM COMPLETE  Result Date: 07/24/2020    ECHOCARDIOGRAM REPORT   Patient Name:   WEST Gherardi Date of Exam: 07/24/2020 Medical Rec #:  [id number]    Height:       69.0 in Accession #:    [id number]   Weight:       216.0 lb Date of Birth:  11-01-1975     BSA:          2.135 m Patient Age:    44 years     BP:           143/86 mmHg Patient Gender: M            HR:           83 bpm. Exam Location:  Inpatient Procedure: 2D Echo, Cardiac Doppler, Color Doppler and Intracardiac            Opacification Agent Indications:    Chest pain  History:        Patient has prior history of Echocardiogram examinations, most                 recent 02/03/2017. Risk Factors:Hypertension, Diabetes and                 Dyslipidemia.  Sonographer:    Ross Ludwig RDCS (AE) Referring Phys: [id number] Corrin Parker  Sonographer Comments: No subcostal window. IMPRESSIONS  1. Left ventricular ejection fraction, by estimation, is 60 to 65%. The left ventricle has normal function. The left ventricle has no regional wall motion abnormalities. Left ventricular diastolic parameters were normal.  2. Right ventricular systolic function is normal. The right ventricular size is normal. Tricuspid regurgitation signal is inadequate for assessing PA pressure.  3. The mitral valve is normal in structure. No evidence of mitral valve regurgitation. No evidence of mitral stenosis.  4. The aortic valve is normal in structure. Aortic valve regurgitation is not visualized. No aortic stenosis is present.  5. Aortic dilatation noted. There is mild dilatation of the ascending aorta, measuring 38 mm. FINDINGS  Left Ventricle: Left ventricular ejection fraction, by estimation, is 60 to 65%. The left ventricle has normal function. The left ventricle has no regional wall motion abnormalities. Definity contrast agent was given IV  to delineate the left ventricular  endocardial borders. The left ventricular internal cavity size was normal in size. There is no  left ventricular hypertrophy. Left ventricular diastolic parameters were normal. Normal left ventricular filling pressure. Right Ventricle: The right ventricular size is normal. No increase in right ventricular wall thickness. Right ventricular systolic function is normal. Tricuspid regurgitation signal is inadequate for assessing PA pressure. Left Atrium: Left atrial size was normal in size. Right Atrium: Right atrial size was normal in size. Pericardium: There is no evidence of pericardial effusion. Mitral Valve: The mitral valve is normal in structure. No evidence of mitral valve regurgitation. No evidence of mitral valve stenosis. MV peak gradient, 2.8 mmHg. The mean mitral valve gradient is 1.0 mmHg. Tricuspid Valve: The tricuspid valve is normal in structure. Tricuspid valve regurgitation is not demonstrated. No evidence of tricuspid stenosis. Aortic Valve: The aortic valve is normal in structure. Aortic valve regurgitation is not visualized. No aortic stenosis is present. Aortic valve mean gradient measures 2.0 mmHg. Aortic valve peak gradient measures 3.6 mmHg. Aortic valve area, by VTI measures 4.30 cm. Pulmonic Valve: The pulmonic valve was normal in structure. Pulmonic valve regurgitation is not visualized. No evidence of pulmonic stenosis. Aorta: Aortic dilatation noted. There is mild dilatation of the ascending aorta, measuring 38 mm. Venous: The inferior vena cava was not well visualized. IAS/Shunts: No atrial level shunt detected by color flow Doppler.  LEFT VENTRICLE PLAX 2D LVIDd:         4.70 cm  Diastology LVIDs:         3.20 cm  LV e' medial:    8.59 cm/s LV PW:         1.00 cm  LV E/e' medial:  8.5 LV IVS:        1.00 cm  LV e' lateral:   9.14 cm/s LVOT diam:     2.20 cm  LV E/e' lateral: 8.0 LV SV:         73 LV SV Index:   34 LVOT Area:     3.80 cm  RIGHT VENTRICLE RV Basal diam:  2.30 cm RV S prime:     10.90 cm/s TAPSE (M-mode): 2.0 cm LEFT ATRIUM             Index       RIGHT ATRIUM            Index LA diam:        3.20 cm 1.50 cm/m  RA Area:     11.00 cm LA Vol (A2C):   48.8 ml 22.86 ml/m RA Volume:   22.60 ml  10.59 ml/m LA Vol (A4C):   32.5 ml 15.22 ml/m LA Biplane Vol: 41.1 ml 19.25 ml/m  AORTIC VALVE AV Area (Vmax):    3.59 cm AV Area (Vmean):   2.92 cm AV Area (VTI):     4.30 cm AV Vmax:           95.40 cm/s AV Vmean:          70.900 cm/s AV VTI:            0.169 m AV Peak Grad:      3.6 mmHg AV Mean Grad:      2.0 mmHg LVOT Vmax:         90.10 cm/s LVOT Vmean:        54.500 cm/s LVOT VTI:          0.191 m LVOT/AV VTI ratio: 1.13  AORTA Ao  Root diam: 3.80 cm Ao Asc diam:  3.20 cm MITRAL VALVE MV Area (PHT): 3.99 cm    SHUNTS MV Area VTI:   3.84 cm    Systemic VTI:  0.19 m MV Peak grad:  2.8 mmHg    Systemic Diam: 2.20 cm MV Mean grad:  1.0 mmHg MV Vmax:       0.84 m/s MV Vmean:      53.8 cm/s MV Decel Time: 190 msec MV E velocity: 72.80 cm/s MV A velocity: 65.10 cm/s MV E/A ratio:  1.12 Armanda Magicraci Dominga Mcduffie MD Electronically signed by Armanda Magicraci Elsbeth Yearick MD Signature Date/Time: 07/24/2020/5:46:02 PM    Final     Cardiac Studies   Cardiac cath 07/2020 Conclusion    Ramus lesion is 30% stenosed. Prox LAD lesion is 80% stenosed. A drug-eluting stent was successfully placed using a SYNERGY XD 3.0X16. Post intervention, there is a 0% residual stenosis. Dist LAD lesion is 30% stenosed. Ost LAD to Prox LAD lesion is 30% stenosed. The left ventricular ejection fraction is 45-50% by visual estimate. There is no mitral valve regurgitation. LV end diastolic pressure is normal. There is mild left ventricular systolic dysfunction.   1. Severe stenosis mid LAD 2. Successful PTCA/DES x 1 mid LAD 3. Mild non-obstructive disease in the small ramus intermediate branch and in the distal LAD. 4. Overall preserved LV systolic function with subtle hypokinesis of the anterior wall but poor opacification of the LV. Further assessment with echo.  2D echo 07/24/2020 IMPRESSIONS     1. Left  ventricular ejection fraction, by estimation, is 60 to 65%. The  left ventricle has normal function. The left ventricle has no regional  wall motion abnormalities. Left ventricular diastolic parameters were  normal.   2. Right ventricular systolic function is normal. The right ventricular  size is normal. Tricuspid regurgitation signal is inadequate for assessing  PA pressure.   3. The mitral valve is normal in structure. No evidence of mitral valve  regurgitation. No evidence of mitral stenosis.   4. The aortic valve is normal in structure. Aortic valve regurgitation is  not visualized. No aortic stenosis is present.   5. Aortic dilatation noted. There is mild dilatation of the ascending  aorta, measuring 38 mm.   Patient Profile     45 y.o. male with a history of hypertension, hyperlipidemia, diabetes mellitus, GERD, alcohol abuse, and tobacco abuse who is being seen today for evaluation of chest pain concerning for unstable angina.  Assessment & Plan    Unstable Angina - Patient reports intermittent chest pain for the last couple of months that has been getting worse and occurring more frequently. Also notes constant right upper back/shoulder pain. Outpatient coronary CTA on 07/23/2020 showed 75% stenosis of mid LAD with positive FFR. - EKG shows no acute ST/T changes. - High-sensitivity troponin  neg x 2 (7>7). - Echo with normal LVF and no RWMAs - cardiac cath showed 75% prox to mid LAD s/p PCI now on DAPT with ASA and Brilinta - Continue ASA 81mg  daily, Brilinta 90mg  BID, Nebivolol 10mg  daily and Lipitor 40mg  daily   Hypertension - BP remains elevated today and throughout the day yesterday - Losartan-HCT was on hold for cath - continue Nebivolol 10mg  daily and restart Losartan HCT 100-25mg  daily   Hyperlipidemia - LDL 83 in 02/2020. - Atorvastatin increased to 40mg  daily this admission - repeat FLP and ALT in 6 weeks    Type 2 Diabetes Mellitus - Hemoglobin A1c 6.1 in  02/2020. -  On Metformin  once daily, Trulicity on Sunday, and Jardiance  daily. - continue Jardiance  daily  - restart Metformin in 48 hours and ok to restart Trulicity now - Hbg A1C 8.2% this admit - followup with PCP   Tobacco Abuse - Patient reports smoking 1-2 cigars per week. - educate on important of complete cessation.   Alcohol Abuse - Patient drinks 1 bottle of wine on a daily basis. He states he quit drinking for 1 month back in December and then started drinking again. No history of withdrawal symptoms or DTs. - discussed importance of stopping ETOH  Patient is stable from cardiac standpoint for discharge home.  Followup TOC in 7-10 days.      For questions or updates, please contact [facility] Please consult [email or web address] for contact info under        Signed, Armanda Magic, MD  07/25/2020, 7:36 AM

## 2020-07-25 NOTE — Discharge Instructions (Addendum)
PLEASE REMEMBER TO BRING ALL OF YOUR MEDICATIONS TO EACH OF YOUR FOLLOW-UP OFFICE VISITS.  PLEASE ATTEND ALL SCHEDULED FOLLOW-UP APPOINTMENTS.   Activity: Increase activity slowly as tolerated. You may shower, but no soaking baths (or swimming) for 1 week. No driving for 24 hours. No lifting over 5 lbs for 1 week. No sexual activity for 1 week.   You May Return to Work: in 1 week (if applicable)  Wound Care: You may wash cath site gently with soap and water. Keep cath site clean and dry. If you notice pain, swelling, bleeding or pus at your cath site, please call [phone].    Medication changes: - START Brilinta 07/26/20 - starting 07/26/20 in AM, take 2 tablets (180mg  total), then take 1 tablet (90mg ) in the evening. The next day, take 1 tablet 2 times daily going forward.  - START aspirin 81 mg daily - INCREASE atorvastatin to 40 mg daily - INCREASE fenofibrate to 160mg  daily - INCREASE metformin to 1000mg  2 times daily - you can restart this medication 07/27/20 as prescribed - INCREASE nebivolol 10mg  daily  - A prescription for sublingual nitroglycerin was sent - use as needed for chest pain or shortness of breath that does not go away with rest. Take one tab under the tongue if needed, wait 5 minutes and if symptoms are ongoing you can take an additional tab under the tongue. You can take up to 3 tabs in a 15 minute period, though if taking a 3rd tab, please call EMS to get evaluated.    It is very important that you do not miss doses of your aspirin and brilinta. These medications work to keep your stent open and missing doses put you at risk of forming a blockage in your stent and possibly having a heart attack.

## 2020-07-27 ENCOUNTER — Telehealth (HOSPITAL_COMMUNITY): Payer: Self-pay

## 2020-07-27 ENCOUNTER — Telehealth: Payer: Self-pay

## 2020-07-27 ENCOUNTER — Encounter (HOSPITAL_COMMUNITY): Payer: Self-pay | Admitting: Cardiovascular Disease

## 2020-07-27 MED FILL — Ondansetron HCl Inj 4 MG/2ML (2 MG/ML): INTRAMUSCULAR | Qty: 2 | Status: AC

## 2020-07-27 NOTE — Telephone Encounter (Signed)
TOC call to patient no answer.Left message on personal voice mail to call back.  

## 2020-07-27 NOTE — Telephone Encounter (Signed)
Pt insurance is active and benefits verified through [employer] Co-pay 0, DED $1,200/$753.15 met, out of pocket $4,500/$1,766.36 met, co-insurance 30%. no pre-authorization required. Passport, 07/27/2020_0 :49pm, REF# [id number]   Will contact patient to see if he is interested in the Cardiac Rehab Program. If interested, patient will need to complete follow up appt. Once completed, patient will be contacted for scheduling upon review by the RN Navigator.

## 2020-07-27 NOTE — Telephone Encounter (Signed)
Attempted to call patient in regards to Cardiac Rehab - LM on VM 

## 2020-07-28 ENCOUNTER — Telehealth: Payer: Self-pay

## 2020-07-28 NOTE — Telephone Encounter (Signed)
Pt returned phone call and stated that he seems interested in the cardiac rehab and will be waiting for our phone call once he has been cleared.

## 2020-07-28 NOTE — Telephone Encounter (Signed)
Patient contacted regarding discharge from [facility] hospital 07/25/20.  Patient understands to follow up with Edd Fabian 08/04/20 at 11:15 am at [facility] office.  Patient understands discharge instructions  Patient understands medications and regime  Patient understands to bring all medications to this visit   Patent stated he was light headed appox 1&1/2 hours yesterday.Stated pulse walking around 100 to 105.Stated he drank lemon water which seemed to help.Stated right arm at cath site feels sore.No swelling or redness.He noticed a small bruise at site.He did not sleep well last night due to being anxious.Stated he feels better today.He will keep appointment as scheduled.

## 2020-08-03 NOTE — Progress Notes (Signed)
Cardiology Clinic Note   Patient Name: Ahron Hulbert Date of Encounter: 08/04/2020  Primary Care Provider:  Hoyt Koch, MD Primary Cardiologist:  Mertie Moores, MD  Patient Profile    Jakyren Fluegge 45 year old male presents the clinic today for follow-up evaluation of his coronary artery disease status post PCI with DES x1 to his LAD.  Past Medical History    Past Medical History:  Diagnosis Date   Alcohol dependence ([place])    Diabetes ([place])    GERD (gastroesophageal reflux disease)    Hepatic steatosis    Hyperlipidemia    Hypertension    Obesity    Sinus tachycardia    Past Surgical History:  Procedure Laterality Date   CORONARY STENT INTERVENTION N/A 07/24/2020   Procedure: CORONARY STENT INTERVENTION;  Surgeon: Burnell Blanks, MD;  Location: [facility];  Service: Cardiovascular;  Laterality: N/A;   INTRAVASCULAR ULTRASOUND/IVUS N/A 07/24/2020   Procedure: Intravascular Ultrasound/IVUS;  Surgeon: Burnell Blanks, MD;  Location: [place] [facility];  Service: Cardiovascular;  Laterality: N/A;   LEFT HEART CATH AND CORONARY ANGIOGRAPHY N/A 07/24/2020   Procedure: LEFT HEART CATH AND CORONARY ANGIOGRAPHY;  Surgeon: Burnell Blanks, MD;  Location: [place] [facility];  Service: Cardiovascular;  Laterality: N/A;   none      Allergies  No Known Allergies  History of Present Illness    Fransico Nylen has a PMH of HTN, HLD, diabetes mellitus type 2, GERD, alcohol abuse, tobacco abuse, and coronary artery disease.  He presented to the emergency department with chest pain concerning for unstable angina.  He is followed by Dr. Lovena Le and was seen on 4/22.  During that time he reported exertional chest pain.  He underwent coronary CTA 07/23/2020 which showed 75% stenosis was in the mid LAD and was positive for FFR.  He had recurrent chest discomfort following the procedure and presented to the emergency department for further evaluation.  He  described right upper back/shoulder pain that radiated to his neck and sometimes to the right side of his chest.  He reported that the pain has been consistent for the past couple months.  He also reported intermittent 5 out of 10 left-sided chest pain at rest and with exertion.  He was noted to have new dyspnea on exertion.  He also described possible orthopnea but no PND or lower extremity swelling.  In the emergency department his EKG showed no acute changes.  He was chest pain-free at time of admission but did have complaints of right upper back/shoulder pain.  His echocardiogram 07/24/2020 showed LVEF 60-65%, mild ascending aortic dilation measuring 38 mm, and valvular abnormalities.  He underwent cardiac catheterization on 07/24/2020 which showed ramus lesion 30%, proximal LAD lesion 80%, distal LAD 30%, and LVEF 45-50%.  He received PCI with DES x1 to his LAD.  He presents the clinic today for follow-up evaluation states he has not had any further back pain or pain under his right shoulder blade since his stents were placed.  He does report 1 episode of anxiousness after being discharged.  He is concerned about it was bruising post cath.  We discussed that this is normal with his aspirin and Brilinta.  He has not returned to physical activity.  We discussed increasing his aerobic exercise to 150 minutes weekly.  I have encouraged him to increase his physical activity as slowly tolerated.  We reviewed his angiography and his lipid panel.  I will start him on Vascepa and  refer him to Dr. Debara Pickett for further review and management.  We will order a CBC today, give a salty 6 diet sheet, and have him follow-up with Dr. Acie Fredrickson in 3 months.  Today he denies chest pain, shortness of breath, lower extremity edema, fatigue, palpitations, melena, hematuria, hemoptysis, diaphoresis, weakness, presyncope, syncope, orthopnea, and PND.   Home Medications    Prior to Admission medications   Medication Sig Start Date  End Date Taking? Authorizing Provider  aspirin EC 81 MG EC tablet Take 1 tablet (81 mg total) by mouth daily. Swallow whole. 07/25/20   Kroeger, Lorelee Cover., PA-C  atorvastatin (LIPITOR) 40 MG tablet Take 1 tablet (40 mg total) by mouth daily. 07/25/20   Kroeger, Lorelee Cover., PA-C  Blood Glucose Monitoring Suppl (ONETOUCH VERIO IQ SYSTEM) w/Device KIT Use as directed 12/30/15   Philemon Kingdom, MD  Cholecalciferol (VITAMIN D) 400 UNITS capsule Take 500 Units by mouth daily.    [provider]  Continuous Blood Gluc Receiver (FREESTYLE LIBRE 14 DAY READER) DEVI USE AS DIRECTED BY YOUR DOCTOR FOR 1 DOSE 02/22/19   Philemon Kingdom, MD  Continuous Blood Gluc Sensor (FREESTYLE LIBRE 14 DAY SENSOR) MISC USE EVERY 14 DAYS AND CHANGE EVERY 2 WEEKS 04/16/20   Philemon Kingdom, MD  Dulaglutide (TRULICITY) 3 KP/5.3ZS SOPN Inject 3 mg into the skin once a week. 03/06/20   Hoyt Koch, MD  esomeprazole (NEXIUM) 20 MG capsule Take 20 mg by mouth daily at 12 noon.    [provider]  fenofibrate 160 MG tablet Take 1 tablet (160 mg total) by mouth daily. 07/25/20   Kroeger, Lorelee Cover., PA-C  glucose blood (ONETOUCH VERIO) test strip USE AS INSTRUCTED TO CHECK BLOOD SUGAR 1x DAILY 01/29/19   Philemon Kingdom, MD  JARDIANCE 25 MG TABS tablet Take 25 mg by mouth daily. 05/16/20   [provider]  losartan-hydrochlorothiazide (HYZAAR) 100-25 MG tablet Take 1 tablet by mouth daily. 03/06/20   Hoyt Koch, MD  metFORMIN (GLUCOPHAGE) 1000 MG tablet Take 1 tablet (1,000 mg total) by mouth 2 (two) times daily with a meal. 03/06/20   Hoyt Koch, MD  nebivolol (BYSTOLIC) 10 MG tablet Take 1 tablet (10 mg total) by mouth daily. 07/25/20   Kroeger, Lorelee Cover., PA-C  nitroGLYCERIN (NITROSTAT) 0.4 MG SL tablet Place 1 tablet (0.4 mg total) under the tongue every 5 (five) minutes x 3 doses as needed for chest pain. 07/25/20   Kroeger, Daleen Snook M., PA-C  ONETOUCH DELICA LANCETS 82L MISC USE  TO OBTAIN A BLOOD SPECIMEN ONCE PER DAY 08/23/16   Philemon Kingdom, MD  ticagrelor (BRILINTA) 90 MG TABS tablet Take 1 tablet (90 mg total) by mouth 2 (two) times daily. Take 2 tablets for your first dose ($RemoveBef'180mg'awVxLnUNVm$  x1), then 1 tablet that evening. Resume 1 tablet 2 times daily going forward. 07/26/20   Kroeger, Lorelee Cover., PA-C    Family History    Family History  Problem Relation Age of Onset   Hypertension Mother    Diabetes Mother    Heart disease Paternal Uncle    Heart attack Paternal Grandmother    Colon cancer Neg Hx    Esophageal cancer Neg Hx    Stomach cancer Neg Hx    He indicated that the status of his mother is unknown. He indicated that the status of his paternal grandmother is unknown. He indicated that the status of his paternal uncle is unknown. He indicated that the status of his neg hx  is unknown.  Social History    Social History   Socioeconomic History   Marital status: Married    Spouse name: Not on file   Number of children: Not on file   Years of education: Not on file   Highest education level: Not on file  Occupational History   Not on file  Tobacco Use   Smoking status: Never   Smokeless tobacco: Never  Substance and Sexual Activity   Alcohol use: Yes    Comment: 3 alcoholic drinks daily   Drug use: No   Sexual activity: Not on file  Other Topics Concern   Not on file  Social History Narrative   Not on file   Social Determinants of Health   Financial Resource Strain: Not on file  Food Insecurity: Not on file  Transportation Needs: Not on file  Physical Activity: Not on file  Stress: Not on file  Social Connections: Not on file  Intimate Partner Violence: Not on file     Review of Systems    General:  No chills, fever, night sweats or weight changes.  Cardiovascular:  No chest pain, dyspnea on exertion, edema, orthopnea, palpitations, paroxysmal nocturnal dyspnea. Dermatological: No rash, lesions/masses Respiratory: No cough,  dyspnea Urologic: No hematuria, dysuria Abdominal:   No nausea, vomiting, diarrhea, bright red blood per rectum, melena, or hematemesis Neurologic:  No visual changes, wkns, changes in mental status. All other systems reviewed and are otherwise negative except as noted above.  Physical Exam    VS:  BP 134/88 (BP Location: Left Arm, Patient Position: Sitting, Cuff Size: Normal)   Pulse 98   Ht $R'5\' 9"'BV$  (1.753 m)   Wt 212 lb 9.6 oz (96.4 kg)   BMI 31.40 kg/m  , BMI Body mass index is 31.4 kg/m. GEN: Well nourished, well developed, in no acute distress. HEENT: normal. Neck: Supple, no JVD, carotid bruits, or masses. Cardiac: RRR, no murmurs, rubs, or gallops. No clubbing, cyanosis, edema.  Radials/DP/PT 2+ and equal bilaterally.  Respiratory:  Respirations regular and unlabored, clear to auscultation bilaterally. GI: Soft, nontender, nondistended, BS + x 4. MS: no deformity or atrophy. Skin: warm and dry, no rash. Neuro:  Strength and sensation are intact. Psych: Normal affect.  Accessory Clinical Findings    Recent Labs: 03/06/2020: ALT 35; TSH 1.49 07/25/2020: BUN 13; Creatinine, Ser 0.84; Hemoglobin 15.7; Platelets 126; Potassium 4.1; Sodium 138   Recent Lipid Panel    Component Value Date/Time   CHOL 155 07/25/2020 0711   TRIG 434 (H) 07/25/2020 0711   HDL 28 (L) 07/25/2020 0711   CHOLHDL 5.5 07/25/2020 0711   VLDL UNABLE TO CALCULATE IF TRIGLYCERIDE OVER 400 mg/dL 07/25/2020 0711   LDLCALC UNABLE TO CALCULATE IF TRIGLYCERIDE OVER 400 mg/dL 07/25/2020 0711   LDLDIRECT 64.7 07/25/2020 0711    ECG personally reviewed by me today-normal sinus rhythm nonspecific ST abnormality 98 bpm- No acute changes  Echocardiogram 07/24/2020 IMPRESSIONS     1. Left ventricular ejection fraction, by estimation, is 60 to 65%. The  left ventricle has normal function. The left ventricle has no regional  wall motion abnormalities. Left ventricular diastolic parameters were  normal.   2.  Right ventricular systolic function is normal. The right ventricular  size is normal. Tricuspid regurgitation signal is inadequate for assessing  PA pressure.   3. The mitral valve is normal in structure. No evidence of mitral valve  regurgitation. No evidence of mitral stenosis.   4. The aortic valve is  normal in structure. Aortic valve regurgitation is  not visualized. No aortic stenosis is present.   5. Aortic dilatation noted. There is mild dilatation of the ascending  aorta, measuring 38 mm.  Cardiac catheterization 07/24/2020 Conclusion     Ramus lesion is 30% stenosed. Prox LAD lesion is 80% stenosed. A drug-eluting stent was successfully placed using a SYNERGY XD 3.0X16. Post intervention, there is a 0% residual stenosis. Dist LAD lesion is 30% stenosed. Ost LAD to Prox LAD lesion is 30% stenosed. The left ventricular ejection fraction is 45-50% by visual estimate. There is no mitral valve regurgitation. LV end diastolic pressure is normal. There is mild left ventricular systolic dysfunction.   1. Severe stenosis mid LAD 2. Successful PTCA/DES x 1 mid LAD 3. Mild non-obstructive disease in the small ramus intermediate branch and in the distal LAD. 4. Overall preserved LV systolic function with subtle hypokinesis of the anterior wall but poor opacification of the LV. Further assessment with echo. Intervention  Diagnostic Dominance: Co-dominant    Intervention      Assessment & Plan   1.  Coronary artery disease-no chest pain today.  Denies recent episodes of recurrent back or shoulder discomfort.  Underwent cardiac catheterization with PCI and DES to his mid LAD on 07/24/2020. Continue aspirin, atorvastatin, fenofibrate, nebivolol, Brilinta, losartan, HCTZ Heart healthy low-sodium diet-salty 6 given Increase physical activity as tolerated Order CBC  Hyperlipidemia-07/25/2020: Cholesterol 155; HDL 28; LDL Cholesterol UNABLE TO CALCULATE IF TRIGLYCERIDE OVER 400  mg/dL; Triglycerides 434; VLDL UNABLE TO CALCULATE IF TRIGLYCERIDE OVER 400 mg/dL Continue fenofibrate, atorvastatin Heart healthy low-sodium diet-salty 6 given Increase physical activity as tolerated Repeat fasting lipids and LFTs in 6 weeks. Start Lovaza Refer to lipid clinic  GERD-on Nexium Follows with PCP GERD diet  Type 2 diabetes-glucose 143 on 07/25/2020 Continue Jardiance, Trulicity, metformin Heart healthy low-sodium carb modified diet.   Increase physical activity as tolerated Follows with PCP  Obesity-weight today 212.6 pounds Continue weight loss Increase physical activity as tolerated  Follow-up with Dr. Acie Fredrickson in 3 months.  Jossie Ng. Tashanda Fuhrer NP-C    08/04/2020, 11:49 AM [address] Office [phone] Fax [phone]  Notice: This dictation was prepared with Dragon dictation along with smaller phrase technology. Any transcriptional errors that result from this process are unintentional and may not be corrected upon review.  I spent 14 minutes examining this patient, reviewing medications, and using patient centered shared decision making involving her cardiac care.  Prior to her visit I spent greater than 20 minutes reviewing her past medical history,  medications, and prior cardiac tests.

## 2020-08-04 ENCOUNTER — Encounter: Payer: Self-pay | Admitting: General Practice

## 2020-08-04 ENCOUNTER — Ambulatory Visit (INDEPENDENT_AMBULATORY_CARE_PROVIDER_SITE_OTHER): Payer: 59 | Admitting: General Practice

## 2020-08-04 ENCOUNTER — Other Ambulatory Visit: Payer: Self-pay

## 2020-08-04 VITALS — BP 134/88 | HR 98 | Ht 69.0 in | Wt 212.6 lb

## 2020-08-04 DIAGNOSIS — E6609 Other obesity due to excess calories: Secondary | ICD-10-CM | POA: Diagnosis not present

## 2020-08-04 DIAGNOSIS — I2510 Atherosclerotic heart disease of native coronary artery without angina pectoris: Secondary | ICD-10-CM

## 2020-08-04 DIAGNOSIS — E1169 Type 2 diabetes mellitus with other specified complication: Secondary | ICD-10-CM | POA: Diagnosis not present

## 2020-08-04 DIAGNOSIS — K2100 Gastro-esophageal reflux disease with esophagitis, without bleeding: Secondary | ICD-10-CM

## 2020-08-04 DIAGNOSIS — Z79899 Other long term (current) drug therapy: Secondary | ICD-10-CM

## 2020-08-04 DIAGNOSIS — E785 Hyperlipidemia, unspecified: Secondary | ICD-10-CM

## 2020-08-04 MED ORDER — ICOSAPENT ETHYL 1 G PO CAPS: 2.0000 g | ORAL_CAPSULE | Freq: Two times a day (BID) | ORAL | 6 refills | Status: DC

## 2020-08-04 MED ORDER — NITROGLYCERIN 0.4 MG SL SUBL
0.4000 mg | SUBLINGUAL_TABLET | SUBLINGUAL | 3 refills | Status: DC | PRN
  Filled 2021-03-02: qty 25, 1d supply, fill #0
  Filled 2021-04-15: qty 25, 10d supply, fill #0

## 2020-08-04 NOTE — Patient Instructions (Signed)
Medication Instructions:  START VASCEPA 2G TWICE DAILY *If you need a refill on your cardiac medications before your next appointment, please call your pharmacy*  Lab Work: CBC TODAY AND FASTING LIPID AND LFT IN 6 WEEKS (09-15-2020) If you have labs (blood work) drawn today and your tests are completely normal, you will receive your results only by:  MyChart Message (if you have MyChart) OR A paper copy in the mail.  If you have any lab test that is abnormal or we need to change your treatment, we will call you to review the results. You may go to any [facility] that is convenient for you however, we do have a lab in our office that is able to assist you. You DO NOT need an appointment for our lab. The lab is open 8:00am and closes at 4:00pm. Lunch 12:45 - 1:45pm.  Special Instructions REFER TO DR HILTY FOR LIPID MANAGEMENT  PLEASE READ AND FOLLOW SALTY 6-ATTACHED-1,800 mg daily  PLEASE INCREASE PHYSICAL ACTIVITY SLOWLY AS TOLERATED-GOAL 150 MINUTES/WEEKLY  PLEASE READ AND FOLLOW HEART HEALTHY DIET-ATTACHED  Follow-Up: Your next appointment:  3-4 month(s) In Person with Kristeen Miss, MD   At [facility], you and your health needs are our priority.  As part of our continuing mission to provide you with exceptional heart care, we have created designated Provider Care Teams.  These Care Teams include your primary Cardiologist (physician) and Advanced Practice Providers (APPs -  Physician Assistants and Nurse Practitioners) who all work together to provide you with the care you need, when you need it.         Heart-Healthy Eating Plan Heart-healthy meal planning includes: Eating less unhealthy fats. Eating more healthy fats. Making other changes in your diet. Talk with your doctor or a diet specialist (dietitian) to create an eating plan that is right for you. What is my plan? What are tips for following this plan? Cooking Avoid frying your food. Try to bake, boil, grill, or broil it  instead. You can also reduce fat by: Removing the skin from poultry. Removing all visible fats from meats. Steaming vegetables in water or broth. Meal planning  At meals, divide your plate into four equal parts: Fill one-half of your plate with vegetables and green salads. Fill one-fourth of your plate with whole grains. Fill one-fourth of your plate with lean protein foods. Eat 4-5 servings of vegetables per day. A serving of vegetables is: 1 cup of raw or cooked vegetables. 2 cups of raw leafy greens. Eat 4-5 servings of fruit per day. A serving of fruit is: 1 medium whole fruit.  cup of dried fruit.  cup of fresh, frozen, or canned fruit.  cup of 100% fruit juice. Eat more foods that have soluble fiber. These are apples, broccoli, carrots, beans, peas, and barley. Try to get 20-30 g of fiber per day. Eat 4-5 servings of nuts, legumes, and seeds per week: 1 serving of dried beans or legumes equals  cup after being cooked. 1 serving of nuts is  cup. 1 serving of seeds equals 1 tablespoon.  General information Eat more home-cooked food. Eat less restaurant, buffet, and fast food. Limit or avoid alcohol. Limit foods that are high in starch and sugar. Avoid fried foods. Lose weight if you are overweight. Keep track of how much salt (sodium) you eat. This is important if you have high blood pressure. Ask your doctor to tell you more about this. Try to add vegetarian meals each week. Fats Choose healthy  fats. These include olive oil and canola oil, flaxseeds, walnuts, almonds, and seeds. Eat more omega-3 fats. These include salmon, mackerel, sardines, tuna, flaxseed oil, and ground flaxseeds. Try to eat fish at least 2 times each week. Check food labels. Avoid foods with trans fats or high amounts of saturated fat. Limit saturated fats. These are often found in animal products, such as meats, butter, and cream. These are also found in plant foods, such as palm oil, palm kernel  oil, and coconut oil. Avoid foods with partially hydrogenated oils in them. These have trans fats. Examples are stick margarine, some tub margarines, cookies, crackers, and other baked goods. What foods can I eat? Fruits All fresh, canned (in natural juice), or frozen fruits. Vegetables Fresh or frozen vegetables (raw, steamed, roasted, or grilled). Green salads. Grains Most grains. Choose whole wheat and whole grains most of the time. Rice andpasta, including brown rice and pastas made with whole wheat. Meats and other proteins Lean, well-trimmed beef, veal, pork, and lamb. Chicken and [place] without skin. All fish and shellfish. Wild duck, rabbit, pheasant, and venison. Egg whites or low-cholesterol egg substitutes. Dried beans, peas, lentils, and tofu. Seedsand most nuts. Dairy Low-fat or nonfat cheeses, including ricotta and mozzarella. Skim or 1% milk that is liquid, powdered, or evaporated. Buttermilk that is made with low-fatmilk. Nonfat or low-fat yogurt. Fats and oils Non-hydrogenated (trans-free) margarines. Vegetable oils, including soybean, sesame, sunflower, olive, peanut, safflower, corn, canola, and cottonseed. Salad dressings or mayonnaisemade with a vegetable oil. Beverages Mineral water. Coffee and tea. Diet carbonated beverages. Sweets and desserts Sherbet, gelatin, and fruit ice. Small amounts of dark chocolate. Limit all sweets and desserts. Seasonings and condiments All seasonings and condiments. The items listed above may not be a complete list of foods and drinks you can eat. Contact a dietitian for more options. What foods should I avoid? Fruits Canned fruit in heavy syrup. Fruit in cream or butter sauce. Fried fruit. Limitcoconut. Vegetables Vegetables cooked in cheese, cream, or butter sauce. Fried vegetables. Grains Breads that are made with saturated or trans fats, oils, or whole milk. Croissants. Sweet rolls. Donuts. High-fat crackers,such as cheese  crackers. Meats and other proteins Fatty meats, such as hot dogs, ribs, sausage, bacon, rib-eye roast or steak. High-fat deli meats, such as salami and bologna. Caviar. Domestic duck andgoose. Organ meats, such as liver. Dairy Cream, sour cream, cream cheese, and creamed cottage cheese. Whole-milk cheeses. Whole or 2% milk that is liquid, evaporated, or condensed. Whole buttermilk. Cream sauce or high-fat cheese sauce. Yogurt that is made fromwhole milk. Fats and oils Meat fat, or shortening. Cocoa butter, hydrogenated oils, palm oil, coconut oil, palm kernel oil. Solid fats and shortenings, including bacon fat, salt pork, lard, and butter. Nondairy cream substitutes. Salad dressings with cheeseor sour cream. Beverages Regular sodas and juice drinks with added sugar. Sweets and desserts Frosting. Pudding. Cookies. Cakes. Pies. Milk chocolate or white chocolate.Buttered syrups. Full-fat ice cream or ice cream drinks. The items listed above may not be a complete list of foods and drinks to avoid. Contact a dietitian for more information. Summary Heart-healthy meal planning includes eating less unhealthy fats, eating more healthy fats, and making other changes in your diet. Eat a balanced diet. This includes fruits and vegetables, low-fat or nonfat dairy, lean protein, nuts and legumes, whole grains, and heart-healthy oils and fats. This information is not intended to replace advice given to you by your health care provider. Make sure you discuss any questions you  have with your healthcare provider. Document Revised: 04/06/2017 Document Reviewed: 03/10/2017 Elsevier Patient Education  2022 [employer].

## 2020-08-18 ENCOUNTER — Other Ambulatory Visit: Payer: Self-pay

## 2020-08-18 ENCOUNTER — Telehealth: Payer: Self-pay

## 2020-08-18 ENCOUNTER — Encounter: Payer: Self-pay | Admitting: Orthopaedic Surgery

## 2020-08-18 ENCOUNTER — Ambulatory Visit (INDEPENDENT_AMBULATORY_CARE_PROVIDER_SITE_OTHER): Payer: 59

## 2020-08-18 ENCOUNTER — Ambulatory Visit (INDEPENDENT_AMBULATORY_CARE_PROVIDER_SITE_OTHER): Payer: 59 | Admitting: Orthopaedic Surgery

## 2020-08-18 DIAGNOSIS — M542 Cervicalgia: Secondary | ICD-10-CM

## 2020-08-18 MED ORDER — METHOCARBAMOL 750 MG PO TABS
750.0000 mg | ORAL_TABLET | Freq: Three times a day (TID) | ORAL | 1 refills | Status: DC | PRN
Start: 2020-08-18 — End: 2021-05-16
  Filled 2021-03-05: qty 40, 14d supply, fill #0

## 2020-08-18 NOTE — Addendum Note (Signed)
Addended by: Rogers Seeds on: 08/18/2020 09:11 AM   Modules accepted: Orders

## 2020-08-18 NOTE — Telephone Encounter (Signed)
Lvm for pt to cb to work him in this morning-per dr. Magnus Ivan

## 2020-08-18 NOTE — Telephone Encounter (Signed)
Talked to pt

## 2020-08-18 NOTE — Progress Notes (Signed)
Office Visit Note   Patient: Peter Keller           Date of Birth: [birth year]           MRN: [id number] Visit Date: 08/18/2020              Requested by: Myrlene Broker, MD [address] [place],  [place] [address] PCP: Myrlene Broker, MD   Assessment & Plan: Visit Diagnoses:  1. Neck pain     Plan: I did provide a trigger point injection with 1 cc of lidocaine and 1 cc of a steroid around the maximum point of tenderness at the parascapular area on the right side.  Given his loss of cervical lordosis combined with his radicular symptoms and significant weakness of the right upper extremity, a MRI of the cervical spine is warranted to rule out nerve compression.  I will try Robaxin as a muscle relaxer in the interim and we will see him back after this MRI.  Given the failed conservative treatment combined with his x-ray findings and clinical exam findings, his is absolutely warranted at this standpoint.  Follow-Up Instructions: No follow-ups on file.  Follow-up will be after his MRI.  Orders:  Orders Placed This Encounter  Procedures   XR Cervical Spine 2 or 3 views   Meds ordered this encounter  Medications   methocarbamol (ROBAXIN) 750 MG tablet    Sig: Take 1 tablet (750 mg total) by mouth every 8 (eight) hours as needed for muscle spasms.    Dispense:  40 tablet    Refill:  1      Procedures: No procedures performed   Clinical Data: No additional findings.   Subjective: Chief Complaint  Patient presents with   Neck - Pain  The patient is a 45 year old gentleman who was actually seen before.  He is the husband of one of the hospitalist that I know well.  He has been dealing with 2 to 3 months of parascapular pain on the right side it is now radiating to the triceps area down his arm.  He is left-hand dominant.  Just over 2 weeks ago he did have stents placed in his coronary arteries due to blockage.  It was felt at the time that maybe the shoulder pain  was due to to a cardiac issue.  However that has not been the case.  He is on Brilinta as a blood thinner so he cannot take anti-inflammatories.  He has had therapy and massage therapy.  The pain is daily and he is experiencing radicular symptoms going down his right nondominant arm.  This is been slowly getting worse.  It is worse in the morning as well.  HPI  Review of Systems Today there is no listed headache, chest pain, shortness of breath, fever, chills, nausea, vomiting  Objective: Vital Signs: There were no vitals taken for this visit.  Physical Exam He is alert and orient x3 and in no acute distress Ortho Exam Examination of his neck shows a positive Spurling sign to the right side.  He has significant periscapular pain on the right side.  There is triceps weakness on the right side and weak grip and pinch strength.  His intrinsic muscles in the hand are also weak.  His left upper extremity exam is entirely normal from a muscle and sensory standpoint of things. Specialty Comments:  No specialty comments available.  Imaging: XR Cervical Spine 2 or 3 views  Result Date: 08/18/2020 2  views of the cervical spine shows a significant loss of cervical lordosis and even a slight kyphosis in the mid cervical spine.  The disc heights are well-maintained.    PMFS History: Patient Active Problem List   Diagnosis Date Noted   Chest pain 07/25/2020   Coronary artery disease 07/25/2020   Unstable angina (HCC) 07/24/2020   Chest pain of uncertain etiology 06/05/2020   Routine general medical examination at a health care facility 04/07/2015   Essential hypertension, benign 08/29/2014   Type 2 diabetes mellitus with hyperlipidemia (HCC) 08/29/2014   Fatty liver 08/29/2014   Snoring 12/27/2010   Alcohol dependence (HCC) 12/27/2010   Hyperlipidemia associated with type 2 diabetes mellitus (HCC) 12/27/2010   Obesity 07/17/2009   GERD 07/17/2009   Past Medical History:  Diagnosis Date    Alcohol dependence (HCC)    Diabetes (HCC)    GERD (gastroesophageal reflux disease)    Hepatic steatosis    Hyperlipidemia    Hypertension    Obesity    Sinus tachycardia     Family History  Problem Relation Age of Onset   Hypertension Mother    Diabetes Mother    Heart disease Paternal Uncle    Heart attack Paternal Grandmother    Colon cancer Neg Hx    Esophageal cancer Neg Hx    Stomach cancer Neg Hx     Past Surgical History:  Procedure Laterality Date   CORONARY STENT INTERVENTION N/A 07/24/2020   Procedure: CORONARY STENT INTERVENTION;  Surgeon: Kathleene Hazel, MD;  Location: [facility];  Service: Cardiovascular;  Laterality: N/A;   INTRAVASCULAR ULTRASOUND/IVUS N/A 07/24/2020   Procedure: Intravascular Ultrasound/IVUS;  Surgeon: Kathleene Hazel, MD;  Location: [facility];  Service: Cardiovascular;  Laterality: N/A;   LEFT HEART CATH AND CORONARY ANGIOGRAPHY N/A 07/24/2020   Procedure: LEFT HEART CATH AND CORONARY ANGIOGRAPHY;  Surgeon: Kathleene Hazel, MD;  Location: [facility];  Service: Cardiovascular;  Laterality: N/A;   none     Social History   Occupational History   Not on file  Tobacco Use   Smoking status: Never   Smokeless tobacco: Never  Substance and Sexual Activity   Alcohol use: Yes    Comment: 3 alcoholic drinks daily   Drug use: No   Sexual activity: Not on file

## 2020-08-20 ENCOUNTER — Encounter: Payer: Self-pay | Admitting: Internal Medicine

## 2020-09-04 ENCOUNTER — Ambulatory Visit: Payer: 59 | Admitting: Internal Medicine

## 2020-09-07 ENCOUNTER — Other Ambulatory Visit: Payer: Self-pay

## 2020-09-07 ENCOUNTER — Ambulatory Visit
Admission: RE | Admit: 2020-09-07 | Discharge: 2020-09-07 | Disposition: A | Discharge status: Home / Self Care | Payer: 59 | Source: Ambulatory Visit | Attending: Orthopaedic Surgery | Admitting: Orthopaedic Surgery

## 2020-09-07 ENCOUNTER — Other Ambulatory Visit: Payer: 59 | Admitting: *Deleted

## 2020-09-07 ENCOUNTER — Telehealth: Payer: Self-pay

## 2020-09-07 DIAGNOSIS — M542 Cervicalgia: Secondary | ICD-10-CM

## 2020-09-07 NOTE — Telephone Encounter (Signed)
Phil, I am glad to have you follow [name].

## 2020-09-07 NOTE — Telephone Encounter (Signed)
Call placed to Pt to advise he does not need to keep appt with Dr. Ladona Ridgel scheduled for 09/08/2020.  Pt with recent stent and has close follow up with general cardiology.  Pt is asking about Brilinta.  He states since starting Brilinta he has had nose bleeds and bruising.  He thinks the Brilinta "is too strong for me"  and is requesting to switch to Plavix.  Advised would forward to Dr. Elease Hashimoto to see if ok to switch.

## 2020-09-08 ENCOUNTER — Ambulatory Visit: Payer: 59 | Admitting: Internal Medicine

## 2020-09-08 LAB — CBC
Hematocrit: 49.7 % (ref 37.5–51.0)
Hemoglobin: 17.4 g/dL (ref 13.0–17.7)
MCH: 32.7 pg (ref 26.6–33.0)
MCHC: 35 g/dL (ref 31.5–35.7)
MCV: 93 fL (ref 79–97)
Platelets: 158 10*3/uL (ref 150–450)
RBC: 5.32 x10E6/uL (ref 4.14–5.80)
RDW: 12.5 % (ref 11.6–15.4)
WBC: 6.2 10*3/uL (ref 3.4–10.8)

## 2020-09-08 NOTE — Telephone Encounter (Signed)
Left detailed message for patient to call back regarding instructions to stop Brilinta and start Plavix. See Dr. Harvie Bridge instruction regarding Plavix dosing for first day of 150 mg then 75 mg daily.

## 2020-09-08 NOTE — Telephone Encounter (Signed)
Late entry (clarification).  Pt aware he does not need to follow with Dr. Ladona Ridgel since it has been found that he has CAD with stent.

## 2020-09-09 ENCOUNTER — Other Ambulatory Visit: Payer: Self-pay | Admitting: Orthopaedic Surgery

## 2020-09-09 MED ORDER — TIZANIDINE HCL 4 MG PO TABS
4.0000 mg | ORAL_TABLET | Freq: Three times a day (TID) | ORAL | 1 refills | Status: DC | PRN
  Filled 2021-03-05: qty 30, 10d supply, fill #0

## 2020-09-09 MED ORDER — CLOPIDOGREL BISULFATE 75 MG PO TABS
75.0000 mg | ORAL_TABLET | Freq: Every day | ORAL | 3 refills | Status: DC
  Filled 2021-03-02: qty 90, 90d supply, fill #0

## 2020-09-09 MED ORDER — METHYLPREDNISOLONE 4 MG PO TABS: ORAL_TABLET | ORAL | 0 refills | Status: DC

## 2020-09-09 MED ORDER — TRAMADOL HCL 50 MG PO TABS: 50.0000 mg | ORAL_TABLET | Freq: Four times a day (QID) | ORAL | 0 refills | Status: DC | PRN

## 2020-09-09 NOTE — Telephone Encounter (Signed)
Spoke with pt and reviewed information about stopping Brilinta and starting Plavix.  Pt in agreement with plan.  Prescription sent to pharmacy.    When placing order for Plavix, noted pt is taking Esomeprazole (Nexium).  Will route to Dr. Elease Hashimoto to see if he would like to pt to switch to Protonix.

## 2020-09-09 NOTE — Telephone Encounter (Signed)
Patient was returning call 

## 2020-09-11 ENCOUNTER — Telehealth (HOSPITAL_COMMUNITY): Payer: Self-pay

## 2020-09-11 NOTE — Telephone Encounter (Signed)
Pt is not interested in the cardiac rehab program. Closed referral 

## 2020-09-14 MED ORDER — PANTOPRAZOLE SODIUM 40 MG PO TBEC: 40.0000 mg | DELAYED_RELEASE_TABLET | Freq: Every day | ORAL | 11 refills | Status: DC

## 2020-09-14 NOTE — Telephone Encounter (Signed)
I ALWAYS will change a ppi to protonix  when we change from any antiplatelet agent to plavix.  Please DC nexium and start protonix 40 mg a day prn.      ----- Message -----  From: Julio Sicks, RN  Sent: 09/09/2020  12:01 PM EDT  To: Vesta Mixer, MD, [facility] Triage

## 2020-09-14 NOTE — Telephone Encounter (Signed)
Pt aware of med change and verbalizes understanding ./cy 

## 2020-12-06 ENCOUNTER — Encounter: Payer: Self-pay | Admitting: Cardiovascular Disease

## 2020-12-06 NOTE — Progress Notes (Signed)
Cardiology Office Note:    Date:  12/07/2020   ID:  Peter Keller, DOB 06-Jul-1975, MRN [id number]  PCP:  Peter Broker, MD   [facility] Providers Cardiologist:  Kristeen Miss, MD     Referring MD: Peter Keller, *   Chief Complaint  Patient presents with   Coronary Artery Disease        Congestive Heart Failure         Oct. 24, 2022   Peter Keller is a 45 y.o. male with a hx of CAD , s/p stenting EF is normal - 60-65%.  Peter Keller is seen today for follow up of his coronary artery disease and coronary stenting  VS look good  Is exercising 3-4 times a month .  Does elliptical for 20 min, then on the bike for 10 min. Had some chest pain on Oct. 1. Slight ,  Persistent CP ,  took a SL NTG  He thinks it may have been GERD .   Was on nexium previously  Wants to double his protonix .   Trigs were 434 - Vascepa was started at that time  Occasionally forgets his nighttime Vascepa.    We discussed increasing exercise  Diet has improved , less pizza ,  still eats bread  EF form June, 2022 shows EF 60-65%   Having some muscle aches,  we discussed having him hold his Atorva and see if his muscle aches improve We will send him to the lipid clinic if the lipids are not improved or if he is not tolerating the atorvastatin.  Past Medical History:  Diagnosis Date   Alcohol dependence (HCC)    Diabetes (HCC)    GERD (gastroesophageal reflux disease)    Hepatic steatosis    Hyperlipidemia    Hypertension    Obesity    Sinus tachycardia     Past Surgical History:  Procedure Laterality Date   CORONARY STENT INTERVENTION N/A 07/24/2020   Procedure: CORONARY STENT INTERVENTION;  Surgeon: Kathleene Hazel, MD;  Location: [facility];  Service: Cardiovascular;  Laterality: N/A;   INTRAVASCULAR ULTRASOUND/IVUS N/A 07/24/2020   Procedure: Intravascular Ultrasound/IVUS;  Surgeon: Kathleene Hazel, MD;  Location: [facility];  Service:  Cardiovascular;  Laterality: N/A;   LEFT HEART CATH AND CORONARY ANGIOGRAPHY N/A 07/24/2020   Procedure: LEFT HEART CATH AND CORONARY ANGIOGRAPHY;  Surgeon: Kathleene Hazel, MD;  Location: [facility];  Service: Cardiovascular;  Laterality: N/A;   none      Current Medications: Current Meds  Medication Sig   aspirin EC 81 MG EC tablet Take 1 tablet (81 mg total) by mouth daily. Swallow whole.   atorvastatin (LIPITOR) 40 MG tablet Take 1 tablet (40 mg total) by mouth daily.   Cholecalciferol (VITAMIN D) 400 UNITS capsule Take 500 Units by mouth daily.   clopidogrel (PLAVIX) 75 MG tablet Take 1 tablet (75 mg total) by mouth daily.   Continuous Blood Gluc Receiver (FREESTYLE LIBRE 14 DAY READER) DEVI USE AS DIRECTED BY YOUR DOCTOR FOR 1 DOSE   Continuous Blood Gluc Sensor (FREESTYLE LIBRE 14 DAY SENSOR) MISC USE EVERY 14 DAYS AND CHANGE EVERY 2 WEEKS   Dulaglutide (TRULICITY) 3 MG/0.5ML SOPN Inject 3 mg into the skin once a week.   icosapent Ethyl (VASCEPA) 1 g capsule Take 2 capsules (2 g total) by mouth 2 (two) times daily.   JARDIANCE 25 MG TABS tablet Take 25 mg by mouth daily.   losartan-hydrochlorothiazide (  HYZAAR) 100-25 MG tablet Take 1 tablet by mouth daily.   metFORMIN (GLUCOPHAGE) 1000 MG tablet Take 1 tablet (1,000 mg total) by mouth 2 (two) times daily with a meal.   methocarbamol (ROBAXIN) 750 MG tablet Take 1 tablet (750 mg total) by mouth every 8 (eight) hours as needed for muscle spasms.   methylPREDNISolone (MEDROL) 4 MG tablet Medrol dose pack. Take as instructed   nebivolol (BYSTOLIC) 10 MG tablet Take 1 tablet (10 mg total) by mouth daily.   nitroGLYCERIN (NITROSTAT) 0.4 MG SL tablet Place 1 tablet (0.4 mg total) under the tongue every 5 (five) minutes x 3 doses as needed for chest pain.   pantoprazole (PROTONIX) 40 MG tablet Take 1 tablet (40 mg total) by mouth 2 (two) times daily.   tiZANidine (ZANAFLEX) 4 MG tablet Take 1 tablet (4 mg total) by mouth every  8 (eight) hours as needed for muscle spasms.   traMADol (ULTRAM) 50 MG tablet Take 1-2 tablets (50-100 mg total) by mouth every 6 (six) hours as needed.   [DISCONTINUED] pantoprazole (PROTONIX) 40 MG tablet Take 1 tablet (40 mg total) by mouth daily.     Allergies:   Patient has no known allergies.   Social History   Socioeconomic History   Marital status: Married    Spouse name: Not on Keller   Number of children: Not on Keller   Years of education: Not on Keller   Highest education level: Not on Keller  Occupational History   Not on Keller  Tobacco Use   Smoking status: Never   Smokeless tobacco: Never  Substance and Sexual Activity   Alcohol use: Yes    Comment: 3 alcoholic drinks daily   Drug use: No   Sexual activity: Not on Keller  Other Topics Concern   Not on Keller  Social History Narrative   Not on Keller   Social Determinants of Health   Financial Resource Strain: Not on Keller  Food Insecurity: Not on Keller  Transportation Needs: Not on Keller  Physical Activity: Not on Keller  Stress: Not on Keller  Social Connections: Not on Keller     Family History: The patient's family history includes Diabetes in his mother; Heart attack in his paternal grandmother; Heart disease in his paternal uncle; Hypertension in his mother. There is no history of Colon cancer, Esophageal cancer, or Stomach cancer.  ROS:   Please see the history of present illness.     All other systems reviewed and are negative.  EKGs/Labs/Other Studies Reviewed:    The following studies were reviewed today:   EKG:     Recent Labs: 03/06/2020: ALT 35; TSH 1.49 07/25/2020: BUN 13; Creatinine, Ser 0.84; Potassium 4.1; Sodium 138 09/07/2020: Hemoglobin 17.4; Platelets 158  Recent Lipid Panel    Component Value Date/Time   CHOL 155 07/25/2020 0711   TRIG 434 (H) 07/25/2020 0711   HDL 28 (L) 07/25/2020 0711   CHOLHDL 5.5 07/25/2020 0711   VLDL UNABLE TO CALCULATE IF TRIGLYCERIDE OVER 400 mg/dL 09/32/6712 4580    LDLCALC UNABLE TO CALCULATE IF TRIGLYCERIDE OVER 400 mg/dL 99/83/3825 0539   LDLDIRECT 64.7 07/25/2020 0711     Risk Assessment/Calculations:           Physical Exam:    VS:  BP 122/88   Pulse 88   Ht 5\' 9"  (1.753 m)   Wt 210 lb 6.4 oz (95.4 kg)   SpO2 98%   BMI 31.07 kg/m     Wt  Readings from Last 3 Encounters:  12/07/20 210 lb 6.4 oz (95.4 kg)  08/04/20 212 lb 9.6 oz (96.4 kg)  07/24/20 216 lb (98 kg)     GEN:  Well nourished, well developed in no acute distress HEENT: Normal NECK: No JVD; No carotid bruits LYMPHATICS: No lymphadenopathy CARDIAC: RRR, no murmurs, rubs, gallops RESPIRATORY:  Clear to auscultation without rales, wheezing or rhonchi  ABDOMEN: Soft, non-tender, non-distended MUSCULOSKELETAL:  No edema; No deformity  SKIN: Warm and dry NEUROLOGIC:  Alert and oriented x 3 PSYCHIATRIC:  Normal affect   ASSESSMENT:    1. Coronary artery disease involving native coronary artery of native heart without angina pectoris   2. Hyperlipidemia associated with type 2 diabetes mellitus (HCC)   3. Medication management    PLAN:      1.  Coronary artery disease: He is not having any episodes of angina.  He does have occasional gastroesophageal reflux but he states that this is not associated with exercise and seems to be more related to eating.  We will increase his Protonix to 40 mg twice a day and see if that helps. Will continue with risk factor modification.  He is only exercising 3-4 times per month.  I have encouraged him to try to work out at least 3-4 times per week with a goal of working out an hour each day that he works out.  2.  Hyperlipidemia: His triglyceride levels were very elevated in June.  He was started on Vascepa at that time.  He is on atorvastatin.  He has noticed some occasional muscle aches.  He will hold the atorvastatin for 2 or 3 weeks to see if this helps with the muscle aches.  If this does improve the muscle aches, will refer him to  our lipid clinic for consideration of a PCSK9 inhibitor.  We could also  consider changing to rosuvastatin but I suspect eventually he will need to be on a PCSK9 inhibitor.        Medication Adjustments/Labs and Tests Ordered: Current medicines are reviewed at length with the patient today.  Concerns regarding medicines are outlined above.  Orders Placed This Encounter  Procedures   Lipid Profile   CBC   Basic Metabolic Panel (BMET)   Hepatic function panel   Meds ordered this encounter  Medications   pantoprazole (PROTONIX) 40 MG tablet    Sig: Take 1 tablet (40 mg total) by mouth 2 (two) times daily.    Dispense:  180 tablet    Refill:  3     Patient Instructions  Medication Instructions:   Increase your Protonix to 40 mg twice a day  Your physician recommends that you continue on your current medications as directed. Please refer to the Current Medication list given to you today.  *If you need a refill on your cardiac medications before your next appointment, please call your pharmacy*   Lab Work:  Fasting Lipids, CBC, LFT, BMET  If you have labs (blood work) drawn today and your tests are completely normal, you will receive your results only by: MyChart Message (if you have MyChart) OR A paper copy in the mail If you have any lab test that is abnormal or we need to change your treatment, we will call you to review the results.   Testing/Procedures:     Follow-Up: At [facility], you and your health needs are our priority.  As part of our continuing mission to provide you with exceptional heart care,  we have created designated Provider Care Teams.  These Care Teams include your primary Cardiologist (physician) and Advanced Practice Providers (APPs -  Physician Assistants and Nurse Practitioners) who all work together to provide you with the care you need, when you need it.  We recommend signing up for the patient portal called "MyChart".  Sign up information  is provided on this After Visit Summary.  MyChart is used to connect with patients for Virtual Visits (Telemedicine).  Patients are able to view lab/test results, encounter notes, upcoming appointments, etc.  Non-urgent messages can be sent to your provider as well.   To learn more about what you can do with MyChart, go to [email or web address].    Your next appointment:   6 month(s)  The format for your next appointment:   In Person  Provider:   Kristeen Miss, MD   Other Instructions     Signed, Kristeen Miss, MD  12/07/2020 9:20 AM    [facility]

## 2020-12-07 ENCOUNTER — Encounter: Payer: Self-pay | Admitting: Cardiovascular Disease

## 2020-12-07 ENCOUNTER — Ambulatory Visit (INDEPENDENT_AMBULATORY_CARE_PROVIDER_SITE_OTHER): Payer: 59 | Admitting: Cardiovascular Disease

## 2020-12-07 ENCOUNTER — Other Ambulatory Visit: Payer: Self-pay

## 2020-12-07 VITALS — BP 122/88 | HR 88 | Ht 69.0 in | Wt 210.4 lb

## 2020-12-07 DIAGNOSIS — I2510 Atherosclerotic heart disease of native coronary artery without angina pectoris: Secondary | ICD-10-CM | POA: Diagnosis not present

## 2020-12-07 DIAGNOSIS — Z79899 Other long term (current) drug therapy: Secondary | ICD-10-CM | POA: Diagnosis not present

## 2020-12-07 DIAGNOSIS — E785 Hyperlipidemia, unspecified: Secondary | ICD-10-CM

## 2020-12-07 DIAGNOSIS — E1169 Type 2 diabetes mellitus with other specified complication: Secondary | ICD-10-CM | POA: Diagnosis not present

## 2020-12-07 MED ORDER — PANTOPRAZOLE SODIUM 40 MG PO TBEC
40.0000 mg | DELAYED_RELEASE_TABLET | Freq: Two times a day (BID) | ORAL | 3 refills | Status: DC
  Filled 2021-03-02: qty 180, 90d supply, fill #0
  Filled 2021-05-31: qty 180, 90d supply, fill #1

## 2020-12-07 NOTE — Patient Instructions (Signed)
Medication Instructions:   Increase your Protonix to 40 mg twice a day  Your physician recommends that you continue on your current medications as directed. Please refer to the Current Medication list given to you today.  *If you need a refill on your cardiac medications before your next appointment, please call your pharmacy*   Lab Work:  Fasting Lipids, CBC, LFT, BMET  If you have labs (blood work) drawn today and your tests are completely normal, you will receive your results only by: MyChart Message (if you have MyChart) OR A paper copy in the mail If you have any lab test that is abnormal or we need to change your treatment, we will call you to review the results.   Testing/Procedures:     Follow-Up: At [facility], you and your health needs are our priority.  As part of our continuing mission to provide you with exceptional heart care, we have created designated Provider Care Teams.  These Care Teams include your primary Cardiologist (physician) and Advanced Practice Providers (APPs -  Physician Assistants and Nurse Practitioners) who all work together to provide you with the care you need, when you need it.  We recommend signing up for the patient portal called "MyChart".  Sign up information is provided on this After Visit Summary.  MyChart is used to connect with patients for Virtual Visits (Telemedicine).  Patients are able to view lab/test results, encounter notes, upcoming appointments, etc.  Non-urgent messages can be sent to your provider as well.   To learn more about what you can do with MyChart, go to [email or web address].    Your next appointment:   6 month(s)  The format for your next appointment:   In Person  Provider:   Kristeen Miss, MD   Other Instructions

## 2020-12-08 ENCOUNTER — Other Ambulatory Visit: Payer: 59

## 2021-01-12 ENCOUNTER — Other Ambulatory Visit: Payer: Self-pay

## 2021-01-12 ENCOUNTER — Other Ambulatory Visit: Payer: 59 | Admitting: *Deleted

## 2021-01-12 DIAGNOSIS — E1169 Type 2 diabetes mellitus with other specified complication: Secondary | ICD-10-CM

## 2021-01-12 DIAGNOSIS — E785 Hyperlipidemia, unspecified: Secondary | ICD-10-CM

## 2021-01-12 DIAGNOSIS — I2510 Atherosclerotic heart disease of native coronary artery without angina pectoris: Secondary | ICD-10-CM

## 2021-01-12 DIAGNOSIS — Z79899 Other long term (current) drug therapy: Secondary | ICD-10-CM

## 2021-01-12 LAB — HEPATIC FUNCTION PANEL
ALT: 59 IU/L — ABNORMAL HIGH (ref 0–44)
AST: 140 IU/L — ABNORMAL HIGH (ref 0–40)
Albumin: 4.6 g/dL (ref 4.0–5.0)
Alkaline Phosphatase: 93 IU/L (ref 44–121)
Bilirubin Total: 0.8 mg/dL (ref 0.0–1.2)
Bilirubin, Direct: 0.44 mg/dL — ABNORMAL HIGH (ref 0.00–0.40)
Total Protein: 7.6 g/dL (ref 6.0–8.5)

## 2021-01-12 LAB — BASIC METABOLIC PANEL
BUN/Creatinine Ratio: 13 (ref 9–20)
BUN: 10 mg/dL (ref 6–24)
CO2: 24 mmol/L (ref 20–29)
Calcium: 9.4 mg/dL (ref 8.7–10.2)
Chloride: 95 mmol/L — ABNORMAL LOW (ref 96–106)
Creatinine, Ser: 0.76 mg/dL (ref 0.76–1.27)
Glucose: 175 mg/dL — ABNORMAL HIGH (ref 70–99)
Potassium: 3.9 mmol/L (ref 3.5–5.2)
Sodium: 138 mmol/L (ref 134–144)
eGFR: 113 mL/min/{1.73_m2} (ref >59)

## 2021-01-12 LAB — CBC
Hematocrit: 46.9 % (ref 37.5–51.0)
Hemoglobin: 16.6 g/dL (ref 13.0–17.7)
MCH: 33.3 pg — ABNORMAL HIGH (ref 26.6–33.0)
MCHC: 35.4 g/dL (ref 31.5–35.7)
MCV: 94 fL (ref 79–97)
Platelets: 163 10*3/uL (ref 150–450)
RBC: 4.99 x10E6/uL (ref 4.14–5.80)
RDW: 13.3 % (ref 11.6–15.4)
WBC: 5.1 10*3/uL (ref 3.4–10.8)

## 2021-01-12 LAB — LIPID PANEL
Chol/HDL Ratio: 7.1 ratio — ABNORMAL HIGH (ref 0.0–5.0)
Cholesterol, Total: 177 mg/dL (ref 100–199)
HDL: 25 mg/dL — ABNORMAL LOW (ref >39)
Triglycerides: 1233 mg/dL (ref 0–149)

## 2021-01-14 ENCOUNTER — Other Ambulatory Visit: Payer: Self-pay

## 2021-01-14 ENCOUNTER — Encounter: Payer: Self-pay | Admitting: Internal Medicine

## 2021-01-14 ENCOUNTER — Ambulatory Visit (INDEPENDENT_AMBULATORY_CARE_PROVIDER_SITE_OTHER): Payer: 59 | Admitting: Internal Medicine

## 2021-01-14 ENCOUNTER — Telehealth: Payer: Self-pay

## 2021-01-14 VITALS — BP 128/82 | HR 99 | Ht 69.0 in | Wt 206.2 lb

## 2021-01-14 DIAGNOSIS — E1169 Type 2 diabetes mellitus with other specified complication: Secondary | ICD-10-CM

## 2021-01-14 DIAGNOSIS — E785 Hyperlipidemia, unspecified: Secondary | ICD-10-CM | POA: Diagnosis not present

## 2021-01-14 DIAGNOSIS — E6609 Other obesity due to excess calories: Secondary | ICD-10-CM

## 2021-01-14 DIAGNOSIS — Z6832 Body mass index (BMI) 32.0-32.9, adult: Secondary | ICD-10-CM

## 2021-01-14 DIAGNOSIS — Z79899 Other long term (current) drug therapy: Secondary | ICD-10-CM

## 2021-01-14 LAB — POCT GLYCOSYLATED HEMOGLOBIN (HGB A1C): Hemoglobin A1C: 9.3 % — AB (ref 4.0–5.6)

## 2021-01-14 MED ORDER — METFORMIN HCL ER 500 MG PO TB24: 2000.0000 mg | ORAL_TABLET | Freq: Every day | ORAL | 3 refills | Status: DC

## 2021-01-14 MED ORDER — TIRZEPATIDE 5 MG/0.5ML ~~LOC~~ SOAJ: 5.0000 mg | SUBCUTANEOUS | 1 refills | Status: DC

## 2021-01-14 MED ORDER — FREESTYLE LIBRE 3 SENSOR MISC: 1.0000 | 3 refills | Status: DC

## 2021-01-14 NOTE — Telephone Encounter (Signed)
-----   Message from Vesta Mixer, MD sent at 01/14/2021 12:28 PM EST ----- He has seen his endocrinologist Is on a new diabetic med Vascepa has been stopped  Fenofibrate 160 mg a day has been started  He will work on reducing his alcohol intake , will increase his exercise  Lets recheck lipids, LFTs in 6 months ( endocrinologist will be checking in 3 months

## 2021-01-14 NOTE — Patient Instructions (Addendum)
Please continue: - Atorvastatin 40 mg daily - Jardiance 25 mg daily in am - Glipizide 5 mg 15-30 min before a larger meal  Start back: - Fenofibrate 160 mg daily  Stop Vascepa for now.  Stop the regular metformin and start: - Metformin ER 2000 mg in am  Stop Trulicity and start: - Mounjaro 5 mg weekly Let me know how you do with it in 3 weeks.  Please return in 1.5-2 months.

## 2021-01-14 NOTE — Progress Notes (Signed)
Patient ID: Spero Gunnels, male   DOB: 1975/06/04, 45 y.o.   MRN: [id number]  This visit occurred during the SARS-CoV-2 public health emergency.  Safety protocols were in place, including screening questions prior to the visit, additional usage of staff PPE, and extensive cleaning of exam room while observing appropriate contact time as indicated for disinfecting solutions.   HPI: Burl Tauzin is a 45 y.o.-year-old male, returning for follow-up for DM2, dx in 04/2013 (A1c was 11%), non-insulin-dependent, uncontrolled, with long term complications(CAD - s/p drug-eluting stent). Last visit 2 years ago (virtual), previous visit 2 years prior.    Interim history: No increased urination, blurry vision, nausea, and no recent chest pain.  He does have diarrhea after morning medications. Since last visit, he was diagnosed with coronary artery disease after she presented to the ED for chest pain/unstable angina.  Troponins x2 were negative.  He had a coronary artery calcification score of  60 on 07/23/2020.  Since this showed a possible coronary obstruction, he had coronary catheterization later that month that showed severe obstruction of LAD (80%) and mild nonobstructive disease (30% obstruction) in other coronaries.  He had a drug-eluting stent placed.  On Plavix.Marland Kitchen  LVEF was 45 to 50%.  He had a 2D echo that showed an EF of 60 to 65%. He continues to struggle to stop alcohol.  He is looking for AA programs.  DM2: Reviewed HbA1c levels: Lab Results  Component Value Date   HGBA1C 8.2 (H) 07/24/2020   HGBA1C 6.1 03/06/2020   HGBA1C 7.4 (H) 05/28/2019   He is on: - Metformin 1000 mg 2x a day with meals - may forget the dose at night - Trulicity 1.5 mg >> 3 weekly - Jardiance 25 mg daily - Glipizide 5 mg before a larger dinner >> rarely takes it, and only if sugars are already high. Patient was initially on Janumet extended-release, which was subsequently stopped.  Amaryl was added 07/2013, but then  switched to Invokana 100 mg daily in 09/2013 because of weight gain. Trulicity 0.75 mg weekly was added in 11/2013 for help with weight loss >> increased to 1.5 mg weekly in 01/2014 >> increased to 3 mg weekly and 01/2019. Previously on metformin extended-release - started  in 03/2014 Previously on [place].  Now on Jardiance per insurance preference  At last visit, I recommended the freestyle libre CGM and send the prescription to his pharmacy.  He is not using this.  Pt is not checking sugars fequently. From before: - am: 119, 143-167, 181 >> 118, 132, 176 >> 140-142, 189  - 2h after b'fast: 149 >> n/c - before lunch: n/c >> 134, 154 >> 147, 195 - 2h after lunch: n/c >> 266 - before dinner: n/c >> 142 - 2h after dinner: 120-160 >> 121 on diet >> n/c >> 137 - bedtime: n/c >> 209 while in vacation >> n/c - nighttime: n/c Lowest sugar was 118 >> 137; it is unclear at which CBG level he has hypoglycemia awareness Highest sugar was 181 >> 266.  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   He was exercising regularly at the gym, not recently.  -No CKD, last BUN/creatinine:  Lab Results  Component Value Date   BUN 10 01/12/2021   CREATININE 0.76 01/12/2021   -+ HL;  last set of lipids: Lab Results  Component Value Date   CHOL 177 01/12/2021   HDL 25 (L) 01/12/2021   LDLCALC Comment (A) 01/12/2021   LDLDIRECT 64.7 07/25/2020  TRIG 1,233 (HH) 01/12/2021   CHOLHDL 7.1 (H) 01/12/2021  On Lipitor 20 >> 40.  Also previously on fenofibrate, but has not been taking it recently.  He was started on Vascepa 2 g twice a day, but this is causing diarrhea.  - last eye exam was in 2019: No DR reportedly  -No numbness and tingling in his feet.  She also has a history of HTN. When I saw him in 01/2017, he just went skiing and forgot BP meds: drank 2 cups of coffee >> HR 125-145 and BP when he returned 213/105.  He was started on blood pressure medication then. He also has a history of  GERD.  ROS: + see HPI  I reviewed pt's medications, allergies, PMH, social hx, family hx, and changes were documented in the history of present illness. Otherwise, unchanged from my initial visit note.  Past Medical History:  Diagnosis Date   Alcohol dependence (HCC)    Diabetes (HCC)    GERD (gastroesophageal reflux disease)    Hepatic steatosis    Hyperlipidemia    Hypertension    Obesity    Sinus tachycardia    Past Surgical History:  Procedure Laterality Date   CORONARY STENT INTERVENTION N/A 07/24/2020   Procedure: CORONARY STENT INTERVENTION;  Surgeon: Kathleene Hazel, MD;  Location: [facility];  Service: Cardiovascular;  Laterality: N/A;   INTRAVASCULAR ULTRASOUND/IVUS N/A 07/24/2020   Procedure: Intravascular Ultrasound/IVUS;  Surgeon: Kathleene Hazel, MD;  Location: [facility];  Service: Cardiovascular;  Laterality: N/A;   LEFT HEART CATH AND CORONARY ANGIOGRAPHY N/A 07/24/2020   Procedure: LEFT HEART CATH AND CORONARY ANGIOGRAPHY;  Surgeon: Kathleene Hazel, MD;  Location: [facility];  Service: Cardiovascular;  Laterality: N/A;   none     History   Social History   Marital Status: Married    Spouse Name: N/A   Number of Children: 2   Occupational History    Nurse, adult    Social History Main Topics   Smoking status: Never Smoker    Smokeless tobacco: Never Used   Alcohol Use: Yes     Comment: 3 alcoholic drinks daily   Drug Use: No   Current Outpatient Medications on File Prior to Visit  Medication Sig Dispense Refill   aspirin EC 81 MG EC tablet Take 1 tablet (81 mg total) by mouth daily. Swallow whole. 90 tablet 3   atorvastatin (LIPITOR) 40 MG tablet Take 1 tablet (40 mg total) by mouth daily. 90 tablet 3   Cholecalciferol (VITAMIN D) 400 UNITS capsule Take 500 Units by mouth daily.     clopidogrel (PLAVIX) 75 MG tablet Take 1 tablet (75 mg total) by mouth daily. 90 tablet 3   Continuous Blood Gluc  Receiver (FREESTYLE LIBRE 14 DAY READER) DEVI USE AS DIRECTED BY YOUR DOCTOR FOR 1 DOSE 1 each 0   Continuous Blood Gluc Sensor (FREESTYLE LIBRE 14 DAY SENSOR) MISC USE EVERY 14 DAYS AND CHANGE EVERY 2 WEEKS 2 each 0   Dulaglutide (TRULICITY) 3 MG/0.5ML SOPN Inject 3 mg into the skin once a week. 12 mL 3   fenofibrate 160 MG tablet Take 1 tablet (160 mg total) by mouth daily. (Patient not taking: Reported on 12/07/2020) 90 tablet 3   icosapent Ethyl (VASCEPA) 1 g capsule Take 2 capsules (2 g total) by mouth 2 (two) times daily. 120 capsule 6   JARDIANCE 25 MG TABS tablet Take 25 mg by mouth daily.  losartan-hydrochlorothiazide (HYZAAR) 100-25 MG tablet Take 1 tablet by mouth daily. 90 tablet 3   metFORMIN (GLUCOPHAGE) 1000 MG tablet Take 1 tablet (1,000 mg total) by mouth 2 (two) times daily with a meal. 180 tablet 3   methocarbamol (ROBAXIN) 750 MG tablet Take 1 tablet (750 mg total) by mouth every 8 (eight) hours as needed for muscle spasms. 40 tablet 1   methylPREDNISolone (MEDROL) 4 MG tablet Medrol dose pack. Take as instructed 21 tablet 0   nebivolol (BYSTOLIC) 10 MG tablet Take 1 tablet (10 mg total) by mouth daily. 90 tablet 3   nitroGLYCERIN (NITROSTAT) 0.4 MG SL tablet Place 1 tablet (0.4 mg total) under the tongue every 5 (five) minutes x 3 doses as needed for chest pain. 25 tablet 3   pantoprazole (PROTONIX) 40 MG tablet Take 1 tablet (40 mg total) by mouth 2 (two) times daily. 180 tablet 3   tiZANidine (ZANAFLEX) 4 MG tablet Take 1 tablet (4 mg total) by mouth every 8 (eight) hours as needed for muscle spasms. 30 tablet 1   traMADol (ULTRAM) 50 MG tablet Take 1-2 tablets (50-100 mg total) by mouth every 6 (six) hours as needed. 30 tablet 0   No current facility-administered medications on file prior to visit.   No Known Allergies Family History  Problem Relation Age of Onset   Hypertension Mother    Diabetes Mother    Heart disease Paternal Uncle    Heart attack Paternal  Grandmother    Colon cancer Neg Hx    Esophageal cancer Neg Hx    Stomach cancer Neg Hx    PE: BP 128/82 (BP Location: Right Arm, Patient Position: Sitting, Cuff Size: Normal)   Pulse 99   Ht 5\' 9"  (1.753 m)   Wt 206 lb 3.2 oz (93.5 kg)   SpO2 95%   BMI 30.45 kg/m  Wt Readings from Last 3 Encounters:  01/14/21 206 lb 3.2 oz (93.5 kg)  12/07/20 210 lb 6.4 oz (95.4 kg)  08/04/20 212 lb 9.6 oz (96.4 kg)   Constitutional: overweight, in NAD Eyes: PERRLA, EOMI, no exophthalmos ENT: moist mucous membranes, no thyromegaly, no cervical lymphadenopathy Cardiovascular: Tachycardia, RR, No MRG Respiratory: CTA B Musculoskeletal: no deformities, strength intact in all 4 Skin: moist, warm, no rashes Neurological: no tremor with outstretched hands, DTR normal in all 4   ASSESSMENT: 1. DM2, non-insulin-dependent, uncontrolled, with complications: - CAD, s/p drug-eluting stent 07/2020  2. Hypertriglyceridemia  3.  Obesity class I  - Right upper quadrant ultrasound from 08/07/2013 shows probable fatty infiltration, confirmed on the abdominal MRI 08/13/2013 - AST> ALT, consistent with excessive alcohol use.  He stopped drinking alcohol several times in the past, but restarted afterwards.  PLAN:  1. Patient with history of uncontrolled type 2 diabetes, returning after another long absence of 2 years.  Our last visit was virtual, 3 months after he had been admitted with COVID-19 infection.  HbA1c at that time, was 8.7%, much higher than before.  We increased his Trulicity dose and restarted glipizide.  I sent a prescription for the freestyle libre CGM to his pharmacy.  However, he was lost for follow-up afterwards.  More recent HbA1c was 8.2% 6 months ago. -As of now, he continues on an oral medication regimen with metformin, Jardiance, and also Trulicity once a week.  He is not taking glipizide before larger meals, but tells me that he is taking it rarely, when sugars are already high. -We  discussed that in the  light of his recent diagnosis of obstructive coronary artery disease, we need excellent diabetes control.  After his coronary stent placement this summer, he started to improve his diet and his also starting to go back to the gym.  He is also trying to stop alcohol. -At this visit, I advised him to continue Jardiance at the current dose.  I also advised him to take glipizide before a larger meal and not wait until sugars are already high.  Since he describes diarrhea after morning meds, I advised him to switch to metformin ER and to take it with breakfast.  As he is forgetting his evening metformin, I advised him that he can take the full metformin dose in the morning I advised him to reduce the dose from 4 tablets to 2 tablets only if he still had diarrhea. -We also discussed about stopping Trulicity and starting Mounjaro, for stronger effect.  Since he does have some diarrhea I advised him to start at a lower dose, 5 mg weekly for the next 4 weeks and then to let me know to send a higher dose to the pharmacy if he tolerates this dose well.  I gave him the [facility] coupon and explained how to use it. -He absolutely needs to start checking blood sugars.  As of now, he is not checking at all.  He did have a freestyle libre CGM in the past.  As of now I suggested to start a freestyle libre 3.  Sent prescription to the pharmacy and explained how this differs from his previous sensor. -If the sugars do not improve after the above measures, we will probably need to start long-acting insulin - I suggested to:  Patient Instructions  Please continue: - Atorvastatin 40 mg daily - Jardiance 25 mg daily in am - Glipizide 5 mg 15-30 min before a larger meal  Start back: - Fenofibrate 160 mg daily  Stop Vascepa for now.  Stop the regular metformin and start: - Metformin ER 2000 mg in am  Stop Trulicity and start: - Mounjaro 5 mg weekly Let me know how you do with it in 3  weeks.  Please return in 1.5-2 months.  - we checked his HbA1c: 9.3% (higher) - advised to check sugars at different times of the day - 4x a day, rotating check times - advised for yearly eye exams >> he is not UTD - return to clinic in 1.5-2 months   2. Hypertriglyceridemia -he has a history of triglycerides in the 900s -started on atorvastatin and Tricor.  He was off and on fibrate in the past. -Reviewed latest lipid panel from 01/12/2021-very high triglycerides, in the pancreatitis range, LDL at goal, HDL low: Lab Results  Component Value Date   CHOL 177 01/12/2021   HDL 25 (L) 01/12/2021   LDLCALC Comment (A) 01/12/2021   LDLDIRECT 64.7 07/25/2020   TRIG 1,233 (HH) 01/12/2021   CHOLHDL 7.1 (H) 01/12/2021  -Continues Lipitor 40 mg daily.  He was previously on Tricor 145 mg daily then 160 mg daily, but now off. Tried Vascepa >> diarrhea.  Also, he did not feel that this was strong enough.  I explained that indeed, this is only used if triglycerides are mildly elevated but for such a significant increase, will need a fibrate.  I advised him to start back on Tricor 160 mg daily. -He is also working on reducing alcohol and improving diet.  I explained that both are crucial to improve his triglyceride control.  3.  Obesity class I -continue SGLT 2 inhibitor and GLP-1 receptor agonist which should also help with weight loss.  We will switch to [facility], which is a stronger weight loss effect -he is net -4 lbs since our last in person visit  - Total time spent for the visit: 40 minutes, in obtaining medical information from the chart and also from the patient, reviewing his  previous labs, imaging evaluations, cardiac calcium score, cardiac catheterization, and 2D echo results, and also previous treatments, reviewing his symptoms, counseling him about his conditions (please see the discussed topics above), and developing a plan to further investigate and treat them; he had a number of  questions which I addressed.  Carlus Pavlov, MD PhD [facility] Endocrinology

## 2021-01-14 NOTE — Telephone Encounter (Signed)
Labs scheduled in 6 months. Results released via MyChart.

## 2021-02-02 ENCOUNTER — Other Ambulatory Visit: Payer: Self-pay | Admitting: Internal Medicine

## 2021-02-02 ENCOUNTER — Encounter: Payer: Self-pay | Admitting: Internal Medicine

## 2021-02-02 DIAGNOSIS — E785 Hyperlipidemia, unspecified: Secondary | ICD-10-CM

## 2021-02-02 MED ORDER — JARDIANCE 25 MG PO TABS: 25.0000 mg | ORAL_TABLET | Freq: Every day | ORAL | 3 refills | Status: DC

## 2021-02-03 ENCOUNTER — Other Ambulatory Visit (HOSPITAL_COMMUNITY): Payer: Self-pay

## 2021-02-03 ENCOUNTER — Other Ambulatory Visit: Payer: Self-pay | Admitting: Internal Medicine

## 2021-02-03 ENCOUNTER — Telehealth: Payer: Self-pay

## 2021-02-03 MED ORDER — TIRZEPATIDE 7.5 MG/0.5ML ~~LOC~~ SOAJ: 7.5000 mg | SUBCUTANEOUS | 11 refills | Status: DC

## 2021-02-03 NOTE — Telephone Encounter (Signed)
No, i believe he gets it with the coupon. C

## 2021-02-22 ENCOUNTER — Other Ambulatory Visit (HOSPITAL_COMMUNITY): Payer: Self-pay

## 2021-02-23 ENCOUNTER — Other Ambulatory Visit (HOSPITAL_COMMUNITY): Payer: Self-pay

## 2021-02-23 ENCOUNTER — Telehealth: Payer: Self-pay | Admitting: Pharmacy Technician

## 2021-02-23 DIAGNOSIS — E1169 Type 2 diabetes mellitus with other specified complication: Secondary | ICD-10-CM

## 2021-02-23 NOTE — Telephone Encounter (Signed)
Patient Advocate Encounter   Received notification from CoverMyMeds that prior authorization for Freestyle Libre 3 sensors is required by his/her insurance [employer]/ [place].  Per Test Claim: pa required. Can get for 74.99 (3 month supply) with evoucher.   PA submitted on 02/23/21 Key [id number] Status is pending     Clinic will continue to follow:  Patient Advocate Fax:  [phone]

## 2021-02-26 ENCOUNTER — Other Ambulatory Visit (HOSPITAL_COMMUNITY): Payer: Self-pay

## 2021-02-26 MED ORDER — METFORMIN HCL ER 500 MG PO TB24
2000.0000 mg | ORAL_TABLET | Freq: Every day | ORAL | 3 refills | Status: DC
  Filled 2021-02-26: qty 360, 90d supply, fill #0
  Filled 2021-05-31: qty 360, 90d supply, fill #1
  Filled 2021-08-25: qty 360, 90d supply, fill #2
  Filled 2021-11-23: qty 360, 90d supply, fill #3

## 2021-02-26 MED ORDER — JARDIANCE 25 MG PO TABS
25.0000 mg | ORAL_TABLET | Freq: Every day | ORAL | 3 refills | Status: DC
  Filled 2021-02-26: qty 90, 90d supply, fill #0
  Filled 2021-05-31: qty 90, 90d supply, fill #1
  Filled 2021-08-25: qty 90, 90d supply, fill #2
  Filled 2021-11-23: qty 90, 90d supply, fill #3

## 2021-02-26 MED ORDER — DEXCOM G6 SENSOR MISC
3 refills | Status: DC
  Filled 2021-02-26: qty 9, fill #0
  Filled 2021-03-05: qty 9, 90d supply, fill #0
  Filled 2021-05-31: qty 9, 90d supply, fill #1

## 2021-02-26 MED ORDER — TIRZEPATIDE 7.5 MG/0.5ML ~~LOC~~ SOAJ
7.5000 mg | SUBCUTANEOUS | 11 refills | Status: DC
  Filled 2021-02-26 – 2021-03-15 (×5): qty 2, 28d supply, fill #0

## 2021-02-26 MED ORDER — DEXCOM G6 RECEIVER DEVI
0 refills | Status: DC
  Filled 2021-02-26: qty 1, fill #0

## 2021-02-26 MED ORDER — DEXCOM G6 TRANSMITTER MISC
3 refills | Status: DC
  Filled 2021-02-26: qty 1, fill #0
  Filled 2021-03-05: qty 1, 90d supply, fill #0
  Filled 2021-05-31: qty 1, 90d supply, fill #1
  Filled 2021-07-14 (×2): qty 1, 90d supply, fill #2

## 2021-02-26 NOTE — Telephone Encounter (Signed)
Yes, if he agrees  - he may want to pay for the [place] 3 out of pocket.

## 2021-02-26 NOTE — Telephone Encounter (Signed)
Pt has new insurance and request new pharmacy. Pt will provide new insurance information to confirm PA on file is valid.

## 2021-02-26 NOTE — Addendum Note (Signed)
Addended by: Kenyon Ana on: 02/26/2021 04:22 PM   Modules accepted: Orders

## 2021-02-26 NOTE — Telephone Encounter (Signed)
Patient Advocate Encounter  Received notification from [employer] that the request for prior authorization for [facility] 3 sensors has been denied due to the patient not trying Dexcom.     This encounter will continue to be updated until final determination.    Specialty Pharmacy Patient Advocate Fax: [phone]

## 2021-03-01 ENCOUNTER — Other Ambulatory Visit (HOSPITAL_COMMUNITY): Payer: Self-pay

## 2021-03-01 MED ORDER — ASPIRIN 81 MG PO TBEC
81.0000 mg | DELAYED_RELEASE_TABLET | Freq: Every day | ORAL | 3 refills | Status: DC
  Filled 2021-03-01: qty 30, 30d supply, fill #0

## 2021-03-01 NOTE — Telephone Encounter (Addendum)
RTC for Dexcom G6 Transmitter, no PA needed.  The copay is $90.  Dexcom G6 sensors for 30 day supply is $66.18.

## 2021-03-02 ENCOUNTER — Other Ambulatory Visit (HOSPITAL_COMMUNITY): Payer: Self-pay

## 2021-03-02 MED ORDER — FENOFIBRATE 145 MG PO TABS: 145.0000 mg | ORAL_TABLET | Freq: Every day | ORAL | 3 refills | Status: DC

## 2021-03-02 MED ORDER — TRULICITY 3 MG/0.5ML ~~LOC~~ SOAJ: 3.0000 mg | SUBCUTANEOUS | 3 refills | Status: DC

## 2021-03-02 MED ORDER — NITROGLYCERIN 0.4 MG SL SUBL
SUBLINGUAL_TABLET | SUBLINGUAL | 3 refills | Status: DC
  Filled 2021-05-31: qty 25, 5d supply, fill #0

## 2021-03-02 MED ORDER — NEBIVOLOL HCL 5 MG PO TABS: 10.0000 mg | ORAL_TABLET | Freq: Every day | ORAL | 3 refills | Status: DC

## 2021-03-02 MED ORDER — JARDIANCE 25 MG PO TABS: 25.0000 mg | ORAL_TABLET | Freq: Every day | ORAL | 3 refills | Status: DC

## 2021-03-02 MED ORDER — METFORMIN HCL 1000 MG PO TABS: 1000.0000 mg | ORAL_TABLET | Freq: Two times a day (BID) | ORAL | 3 refills | Status: DC

## 2021-03-02 MED ORDER — ATORVASTATIN CALCIUM 20 MG PO TABS: 20.0000 mg | ORAL_TABLET | Freq: Every day | ORAL | 3 refills | Status: DC

## 2021-03-02 MED ORDER — FREESTYLE LIBRE 3 SENSOR MISC: 3 refills | Status: DC

## 2021-03-02 MED ORDER — PANTOPRAZOLE SODIUM 40 MG PO TBEC
40.0000 mg | DELAYED_RELEASE_TABLET | Freq: Every day | ORAL | 11 refills | Status: DC
  Filled 2021-03-02: qty 30, 30d supply, fill #0

## 2021-03-02 MED ORDER — FREESTYLE LIBRE 14 DAY SENSOR MISC: 0 refills | Status: DC

## 2021-03-03 ENCOUNTER — Other Ambulatory Visit (HOSPITAL_COMMUNITY): Payer: Self-pay

## 2021-03-04 ENCOUNTER — Other Ambulatory Visit (HOSPITAL_COMMUNITY): Payer: Self-pay

## 2021-03-05 ENCOUNTER — Other Ambulatory Visit (HOSPITAL_COMMUNITY): Payer: Self-pay

## 2021-03-09 ENCOUNTER — Other Ambulatory Visit (HOSPITAL_COMMUNITY): Payer: Self-pay

## 2021-03-10 LAB — HM DIABETES EYE EXAM

## 2021-03-15 ENCOUNTER — Other Ambulatory Visit (HOSPITAL_COMMUNITY): Payer: Self-pay

## 2021-03-17 ENCOUNTER — Encounter: Payer: Self-pay | Admitting: Internal Medicine

## 2021-03-17 ENCOUNTER — Telehealth: Payer: Self-pay | Admitting: Pharmacy Technician

## 2021-03-17 ENCOUNTER — Other Ambulatory Visit (HOSPITAL_COMMUNITY): Payer: Self-pay

## 2021-03-17 ENCOUNTER — Ambulatory Visit (INDEPENDENT_AMBULATORY_CARE_PROVIDER_SITE_OTHER): Payer: 59 | Admitting: Internal Medicine

## 2021-03-17 ENCOUNTER — Other Ambulatory Visit: Payer: Self-pay

## 2021-03-17 VITALS — BP 128/88 | HR 97 | Ht 69.0 in | Wt 201.6 lb

## 2021-03-17 DIAGNOSIS — E1169 Type 2 diabetes mellitus with other specified complication: Secondary | ICD-10-CM

## 2021-03-17 DIAGNOSIS — E6609 Other obesity due to excess calories: Secondary | ICD-10-CM

## 2021-03-17 DIAGNOSIS — Z6832 Body mass index (BMI) 32.0-32.9, adult: Secondary | ICD-10-CM

## 2021-03-17 DIAGNOSIS — E785 Hyperlipidemia, unspecified: Secondary | ICD-10-CM

## 2021-03-17 LAB — LDL CHOLESTEROL, DIRECT: Direct LDL: 91 mg/dL

## 2021-03-17 LAB — LIPID PANEL
Cholesterol: 145 mg/dL (ref 0–200)
HDL: 31.8 mg/dL — ABNORMAL LOW (ref >39.00)
NonHDL: 112.96
Total CHOL/HDL Ratio: 5
Triglycerides: 262 mg/dL — ABNORMAL HIGH (ref 0.0–149.0)
VLDL: 52.4 mg/dL — ABNORMAL HIGH (ref 0.0–40.0)

## 2021-03-17 LAB — COMPLETE METABOLIC PANEL WITHOUT GFR
AG Ratio: 1.5 (calc) (ref 1.0–2.5)
ALT: 61 U/L — ABNORMAL HIGH (ref 9–46)
AST: 96 U/L — ABNORMAL HIGH (ref 10–40)
Albumin: 4.8 g/dL (ref 3.6–5.1)
Alkaline phosphatase (APISO): 47 U/L (ref 36–130)
BUN: 17 mg/dL (ref 7–25)
CO2: 25 mmol/L (ref 20–32)
Calcium: 9.9 mg/dL (ref 8.6–10.3)
Chloride: 101 mmol/L (ref 98–110)
Creat: 0.78 mg/dL (ref 0.60–1.29)
Globulin: 3.2 g/dL (ref 1.9–3.7)
Glucose, Bld: 141 mg/dL — ABNORMAL HIGH (ref 65–99)
Potassium: 3.8 mmol/L (ref 3.5–5.3)
Sodium: 138 mmol/L (ref 135–146)
Total Bilirubin: 1.2 mg/dL (ref 0.2–1.2)
Total Protein: 8 g/dL (ref 6.1–8.1)
eGFR: 112 mL/min/1.73m2

## 2021-03-17 LAB — POCT GLYCOSYLATED HEMOGLOBIN (HGB A1C): Hemoglobin A1C: 6.7 % — AB (ref 4.0–5.6)

## 2021-03-17 LAB — VITAMIN D 25 HYDROXY (VIT D DEFICIENCY, FRACTURES): VITD: 53.98 ng/mL (ref 30.00–100.00)

## 2021-03-17 LAB — MICROALBUMIN / CREATININE URINE RATIO
Creatinine,U: 141.3 mg/dL
Microalb Creat Ratio: 1.1 mg/g (ref 0.0–30.0)
Microalb, Ur: 1.6 mg/dL (ref 0.0–1.9)

## 2021-03-17 MED ORDER — TIRZEPATIDE 7.5 MG/0.5ML ~~LOC~~ SOAJ: 7.5000 mg | SUBCUTANEOUS | 11 refills | Status: DC

## 2021-03-17 MED ORDER — OZEMPIC (1 MG/DOSE) 4 MG/3ML ~~LOC~~ SOPN
1.0000 mg | PEN_INJECTOR | SUBCUTANEOUS | 11 refills | Status: DC
  Filled 2021-03-17: qty 3, 28d supply, fill #0

## 2021-03-17 NOTE — Telephone Encounter (Signed)
Patient Advocate Encounter  Received notification from [employer] [facility]) that prior authorization for [facility] 7.5MG  is required.   PA submitted on 2.1.23 Key [id number] Status is pending   [facility] will continue to follow  Ricke Hey, CPhT Patient Advocate [facility] Phone: [phone] Fax:  [phone]

## 2021-03-17 NOTE — Patient Instructions (Signed)
Please continue: - Metformin ER 2000 mg in a.m. - Atorvastatin 40 mg daily - Jardiance 25 mg daily in am - Glipizide 5 mg 15-30 min before a larger meal  Change from Mounjaro to Ozempic 1 mg weekly.  Please return in 3 months.

## 2021-03-17 NOTE — Telephone Encounter (Addendum)
Patient Advocate Encounter  Prior Authorization for [employer] 7.5mg /0.75ml pen injectors has been approved.    PA# [id number]  Effective dates: 03/17/21 through 03/16/22  Per Test Claim Patients co-pay is $25   Spoke with Pharmacy to Process.  Patient Advocate Fax: [phone]

## 2021-03-17 NOTE — Telephone Encounter (Signed)
PA has been submitted.

## 2021-03-17 NOTE — Progress Notes (Signed)
Patient ID: Peter Keller, male   DOB: 1975/11/04, 46 y.o.   MRN: [id number]  This visit occurred during the SARS-CoV-2 public health emergency.  Safety protocols were in place, including screening questions prior to the visit, additional usage of staff PPE, and extensive cleaning of exam room while observing appropriate contact time as indicated for disinfecting solutions.   HPI: Peter Keller is a 46 y.o.-year-old male, returning for follow-up for DM2, dx in 04/2013 (A1c was 11%), non-insulin-dependent, uncontrolled, with long term complications(CAD - s/p drug-eluting stent).  Last visit 2 months ago.  Previous visits were spaced 2 years apart.  Previous visit 2 years ago (virtual), previous visit 2 years prior.    Interim history: No increased urination, blurry vision, nausea, chest pain. He continues to try to stop alcohol. Since last visit, sugars improved significantly and he started to lose weight after we changed his diabetic medications.  He did not have any more diarrhea after switching to the metformin extended release formulation. He has red eyes - reaction to the pupillary dilation solution - improving.   DM2: Reviewed HbA1c levels: Lab Results  Component Value Date   HGBA1C 9.3 (A) 01/14/2021   HGBA1C 8.2 (H) 07/24/2020   HGBA1C 6.1 03/06/2020   He is on: - Metformin 1000 mg 2x a day with meals - may forget the dose at night >> metformin ER 2000 mg in the a.m.  - Trulicity 1.5 mg >> 3 weekly >> Mounjaro 5 mg weekly - Jardiance 25 mg daily - Glipizide 5 mg before a larger dinner  -he seldom has to take this Patient was initially on Janumet extended-release, which was subsequently stopped.  Amaryl was added 07/2013, but then switched to Invokana 100 mg daily in 09/2013 because of weight gain. Trulicity 2.58 mg weekly was added in 11/2013 for help with weight loss >> increased to 1.5 mg weekly in 01/2014 >> increased to 3 mg weekly and 01/2019. Previously on metformin  extended-release - started  in 03/2014 Previously on [place].  Now on Jardiance per insurance preference  He is checking his blood sugars more than 4 times a day with his Dexcom CGM (recently changed from the freestyle libre 3 CGM due to insurance coverage):   Lowest sugar was 118 >> 137 >> 66; it is unclear at which CBG level he has hypoglycemia awareness Highest sugar was 181 >> 266 >> 250.  Glucometer: One Probation officer IQ.   He was exercising regularly at the gym, not recently.  -No CKD, last BUN/creatinine:  Lab Results  Component Value Date   BUN 10 01/12/2021   CREATININE 0.76 01/12/2021   -+ HL;  last set of lipids: Lab Results  Component Value Date   CHOL 177 01/12/2021   HDL 25 (L) 01/12/2021   LDLCALC Comment (A) 01/12/2021   LDLDIRECT 64.7 07/25/2020   TRIG 1,233 (HH) 01/12/2021   CHOLHDL 7.1 (H) 01/12/2021  On Lipitor 20 >> 40.  We restarted fenofibrate at 160 mg daily at last visit.  He was started on Vascepa 2 g twice a day, but this was causing diarrhea >> we stopped this at last OV.  - last eye exam was in 02/2021: No DR reportedly. Dr. Wyatt Portela.  -No numbness and tingling in his feet.  She also has a history of HTN. When I saw him in 01/2017, he just went skiing and forgot BP meds: drank 2 cups of coffee >> HR 125-145 and BP when he returned 213/105.  He  was started on blood pressure medication then. He also has a history of GERD.  On vitamin D 2000 or 5000 units daily - will look at home and let me know. Previous vitamin D levels were normal: Lab Results  Component Value Date   VD25OH 33.55 12/10/2018   VD25OH 52 12/06/2013   ROS: + see HPI  I reviewed pt's medications, allergies, PMH, social hx, family hx, and changes were documented in the history of present illness. Otherwise, unchanged from my initial visit note.  Past Medical History:  Diagnosis Date   Alcohol dependence ([place])    Diabetes ([place])    GERD (gastroesophageal reflux  disease)    Hepatic steatosis    Hyperlipidemia    Hypertension    Obesity    Sinus tachycardia    Past Surgical History:  Procedure Laterality Date   CORONARY STENT INTERVENTION N/A 07/24/2020   Procedure: CORONARY STENT INTERVENTION;  Surgeon: Burnell Blanks, MD;  Location: [facility];  Service: Cardiovascular;  Laterality: N/A;   INTRAVASCULAR ULTRASOUND/IVUS N/A 07/24/2020   Procedure: Intravascular Ultrasound/IVUS;  Surgeon: Burnell Blanks, MD;  Location: [facility];  Service: Cardiovascular;  Laterality: N/A;   LEFT HEART CATH AND CORONARY ANGIOGRAPHY N/A 07/24/2020   Procedure: LEFT HEART CATH AND CORONARY ANGIOGRAPHY;  Surgeon: Burnell Blanks, MD;  Location: [facility];  Service: Cardiovascular;  Laterality: N/A;   none     History   Social History   Marital Status: Married    Spouse Name: N/A   Number of Children: 2   Occupational History    Advertising account executive    Social History Main Topics   Smoking status: Never Smoker    Smokeless tobacco: Never Used   Alcohol Use: Yes     Comment: 3 alcoholic drinks daily   Drug Use: No   Current Outpatient Medications on File Prior to Visit  Medication Sig Dispense Refill   aspirin 81 MG EC tablet Take 1 tablet (81 mg total) by mouth daily. Swallow whole. 90 tablet 3   atorvastatin (LIPITOR) 40 MG tablet Take 1 tablet (40 mg total) by mouth daily. 90 tablet 3   Cholecalciferol (VITAMIN D) 400 UNITS capsule Take 500 Units by mouth daily.     clopidogrel (PLAVIX) 75 MG tablet Take 1 tablet (75 mg total) by mouth daily. 90 tablet 3   Continuous Blood Gluc Receiver (DEXCOM G6 RECEIVER) DEVI Use as instructed to check blood sugar 1 each 0   Continuous Blood Gluc Sensor (DEXCOM G6 SENSOR) MISC Use as instructed to check blood sugar change every 10 days 9 each 3   Continuous Blood Gluc Sensor (FREESTYLE LIBRE 14 DAY SENSOR) MISC Change every 14 days as directed 2 each 0   Continuous  Blood Gluc Sensor (FREESTYLE LIBRE 3 SENSOR) MISC Use one sensor every 14 days as directed 6 each 3   Continuous Blood Gluc Transmit (DEXCOM G6 TRANSMITTER) MISC Use as instructed to check blood sugar. Change every 90 days. 1 each 3   Dulaglutide (TRULICITY) 3 TK/3.5WS SOPN Inject 3 mg into the skin once a week. 12 mL 3   fenofibrate 160 MG tablet Take 1 tablet (160 mg total) by mouth daily. 90 tablet 3   JARDIANCE 25 MG TABS tablet Take 1 tablet (25 mg total) by mouth daily. 90 tablet 3   losartan-hydrochlorothiazide (HYZAAR) 100-25 MG tablet Take 1 tablet by mouth daily. 90 tablet 3   metFORMIN (GLUCOPHAGE-XR) 500 MG 24  hr tablet Take 4 tablets (2,000 mg total) by mouth daily with breakfast. 360 tablet 3   methocarbamol (ROBAXIN) 750 MG tablet Take 1 tablet (750 mg total) by mouth every 8 (eight) hours as needed for muscle spasms. 40 tablet 1   methylPREDNISolone (MEDROL) 4 MG tablet Medrol dose pack. Take as instructed 21 tablet 0   nebivolol (BYSTOLIC) 10 MG tablet Take 1 tablet (10 mg total) by mouth daily. 90 tablet 3   nitroGLYCERIN (NITROSTAT) 0.4 MG SL tablet Place 1 tablet (0.4 mg total) under the tongue every 5 (five) minutes x 3 doses as needed for chest pain. 25 tablet 3   nitroGLYCERIN (NITROSTAT) 0.4 MG SL tablet Place 1 tablet under the tongue every 5 minutes for 3 doses as needed for chest pain 25 tablet 3   pantoprazole (PROTONIX) 40 MG tablet Take 1 tablet (40 mg total) by mouth 2 (two) times daily. 180 tablet 3   tirzepatide (MOUNJARO) 5 MG/0.5ML Pen Inject 5 mg into the skin once a week. 2 mL 1   tirzepatide (MOUNJARO) 7.5 MG/0.5ML Pen Inject 7.5 mg into the skin once a week. 2 mL 11   tiZANidine (ZANAFLEX) 4 MG tablet Take 1 tablet (4 mg total) by mouth every 8 (eight) hours as needed for muscle spasms. 30 tablet 1   traMADol (ULTRAM) 50 MG tablet Take 1-2 tablets (50-100 mg total) by mouth every 6 (six) hours as needed. 30 tablet 0   No current facility-administered  medications on file prior to visit.   No Known Allergies Family History  Problem Relation Age of Onset   Hypertension Mother    Diabetes Mother    Heart disease Paternal Uncle    Heart attack Paternal Grandmother    Colon cancer Neg Hx    Esophageal cancer Neg Hx    Stomach cancer Neg Hx    PE: BP 128/88 (BP Location: Left Arm, Patient Position: Sitting, Cuff Size: Normal)    Pulse 97    Ht $R'5\' 9"'Fe$  (1.753 m)    Wt 201 lb 9.6 oz (91.4 kg)    SpO2 97%    BMI 29.77 kg/m  Wt Readings from Last 3 Encounters:  03/17/21 201 lb 9.6 oz (91.4 kg)  01/14/21 206 lb 3.2 oz (93.5 kg)  12/07/20 210 lb 6.4 oz (95.4 kg)   Constitutional: overweight, in NAD Eyes: PERRLA, EOMI, no exophthalmos ENT: moist mucous membranes, no thyromegaly, no cervical lymphadenopathy Cardiovascular: Tachycardia, RR, No MRG Respiratory: CTA B Musculoskeletal: no deformities, strength intact in all 4 Skin: moist, warm, no rashes Neurological: no tremor with outstretched hands, DTR normal in all 4  ASSESSMENT: 1. DM2, non-insulin-dependent, uncontrolled, with complications: - CAD, s/p drug-eluting stent 07/2020  2. Hypertriglyceridemia  3.  Obesity class I  - Right upper quadrant ultrasound from 08/07/2013 shows probable fatty infiltration, confirmed on the abdominal MRI 08/13/2013 - AST> ALT, consistent with excessive alcohol use.  He stopped drinking alcohol several times in the past, but restarted afterwards.  PLAN:  1. Patient with history of uncontrolled type 2 diabetes, returning at last visit after another long absence of 2 years.  At that time, sugars were uncontrolled and HbA1c was higher, at 9.3%.  He was found to have obstructive coronary artery disease and had a stent placed. -At last visit, he was on metformin, Jardiance, and Trulicity.  He was not taking glipizide consistently, and only whenever the blood sugars are already high.  I advised him to continue Jardiance, take glipizide before  a larger meal  and, since he described diarrhea after morning medications, I advised him to switch to metformin ER and to take the entire daily dose with breakfast as he was forgetting the evening dose.  I also recommended to switch from Trulicity to [facility] for a stronger effect.  We also started the freestyle libre 3 CGM. -Since then, he contacted me that his blood sugars improved significantly -However, more recently, his insurance denied covering the freestyle libre 3 CGM (since then he started the Dexcom CGM) and he could not use Mounjaro last week due to lack of insurance coverage.  He previously was on [facility] for 2 months. CGM interpretation: -At today's visit, we reviewed his CGM downloads: It appears that 86.6% of values are in target range (goal >70%), while 13.2% are higher than 180 (goal <25%), and 0.2% are lower than 70 (goal <4%).  The calculated average blood sugar is 154.  The projected HbA1c for the next 3 months (GMI) is 7.0%. -Reviewing the CGM trends, it appears that his sugars are improved, lower during the night and then increasing around 4 AM and staying within the upper half of the normal range interval.  The sugars are not significantly increasing after meals.  However, it does appear that they are higher than expected from the HbA1c, consistent with the fact that his higher blood sugars after switching to Trulicity last week. -Therefore, for now, we will appeal the insurance decision to not cover Mounjaro, since he had such a spectacular improvement on it in the 2 months that he used it.  I did send a prescription for Ozempic 1 mg weekly to his pharmacy just in case the appeal is rejected.  For now, we will continue the rest of the regimen.  I am hoping that he can stay off glipizide.  Also, we discussed that he may reduce the metformin if absolutely needed [sugars start to drop below). - I suggested to:  Patient Instructions  Please continue: - Metformin ER 2000 mg in a.m. - Jardiance 25 mg  daily in am - Glipizide 5 mg 15-30 min before a larger meal  Change from Mounjaro to Ozempic 1 mg weekly.  Please return in 3 months.  - we checked his HbA1c: 6.7% (MUCH better) - advised to check sugars at different times of the day - 4x a day, rotating check times - advised for yearly eye exams >> he is UTD - We will check annual labs today per his request - return to clinic in 3 months   2. Hypertriglyceridemia -Reviewed latest lipid panel from 12/2020: Triglycerides very high, HDL low: Lab Results  Component Value Date   CHOL 177 01/12/2021   HDL 25 (L) 01/12/2021   LDLCALC Comment (A) 01/12/2021   LDLDIRECT 64.7 07/25/2020   TRIG 1,233 (HH) 01/12/2021   CHOLHDL 7.1 (H) 01/12/2021  -He continues Lipitor 40 mg daily and at last visit we added back Tricor 160 mg daily.  He was previously on Tricor 145 mg daily then 160 mg daily, but now off. Tried Vascepa >> diarrhea.   -He continues to work on reducing alcohol and improving diet -We will check a lipid panel today-fasting  3.  Obesity class I -continue SGLT 2 inhibitor and GLP-1 receptor agonist which should also help with weight loss -He lost 4 pounds before last visit and lost 5 pounds since then  Component     Latest Ref Rng & Units 03/17/2021  Glucose     65 -  99 mg/dL 141 (H)  BUN     7 - 25 mg/dL 17  Creatinine     0.60 - 1.29 mg/dL 0.78  eGFR     > OR = 60 mL/min/1.20m2 112  BUN/Creatinine Ratio     6 - 22 (calc) NOT APPLICABLE  Sodium     563 - 146 mmol/L 138  Potassium     3.5 - 5.3 mmol/L 3.8  Chloride     98 - 110 mmol/L 101  CO2     20 - 32 mmol/L 25  Calcium     8.6 - 10.3 mg/dL 9.9  Total Protein     6.1 - 8.1 g/dL 8.0  Albumin MSPROF     3.6 - 5.1 g/dL 4.8  Globulin     1.9 - 3.7 g/dL (calc) 3.2  AG Ratio     1.0 - 2.5 (calc) 1.5  Total Bilirubin     0.2 - 1.2 mg/dL 1.2  Alkaline phosphatase (APISO)     36 - 130 U/L 47  AST     10 - 40 U/L 96 (H)  ALT     9 - 46 U/L 61 (H)   Cholesterol     0 - 200 mg/dL 145  Triglycerides     0.0 - 149.0 mg/dL 262.0 (H)  HDL Cholesterol     >39.00 mg/dL 31.80 (L)  VLDL     0.0 - 40.0 mg/dL 52.4 (H)  Total CHOL/HDL Ratio      5  NonHDL      112.96  Microalb, Ur     0.0 - 1.9 mg/dL 1.6  Creatinine,U     mg/dL 141.3  MICROALB/CREAT RATIO     0.0 - 30.0 mg/g 1.1  Direct LDL     mg/dL 91.0  VITD     30.00 - 100.00 ng/mL 53.98   His lipids improved significantly.  LDL is still above target, but triglycerides are now in the 200s.  HDL slightly low.  For now, I would suggest to continue the current regimen.  Plan to repeat another lipid panel at next visit.  If not improved further, he may benefit from pushing the dose of Lipitor up. ACR normal. Kidney function normal. ALT and AST elevated  -AST significantly improved but ALT stable. Vitamin D level is normal.  Philemon Kingdom, MD PhD [facility] Endocrinology

## 2021-03-18 ENCOUNTER — Other Ambulatory Visit (HOSPITAL_COMMUNITY): Payer: Self-pay

## 2021-03-18 MED ORDER — TIRZEPATIDE 7.5 MG/0.5ML ~~LOC~~ SOAJ
7.5000 mg | SUBCUTANEOUS | 11 refills | Status: DC
  Filled 2021-03-18: qty 2, 28d supply, fill #0
  Filled 2021-04-15: qty 2, 28d supply, fill #1
  Filled 2021-05-05 – 2021-05-07 (×2): qty 2, 28d supply, fill #2

## 2021-03-24 ENCOUNTER — Encounter: Payer: Self-pay | Admitting: Internal Medicine

## 2021-03-31 ENCOUNTER — Other Ambulatory Visit (HOSPITAL_COMMUNITY): Payer: Self-pay

## 2021-03-31 ENCOUNTER — Other Ambulatory Visit: Payer: Self-pay | Admitting: Orthopaedic Surgery

## 2021-03-31 MED ORDER — HYDROCODONE-ACETAMINOPHEN 5-325 MG PO TABS
1.0000 | ORAL_TABLET | Freq: Four times a day (QID) | ORAL | 0 refills | Status: DC | PRN
Start: 2021-03-31 — End: 2021-04-19
  Filled 2021-03-31: qty 30, 4d supply, fill #0

## 2021-04-01 ENCOUNTER — Other Ambulatory Visit: Payer: Self-pay

## 2021-04-01 ENCOUNTER — Encounter: Payer: Self-pay | Admitting: Orthopaedic Surgery

## 2021-04-01 ENCOUNTER — Other Ambulatory Visit (HOSPITAL_COMMUNITY): Payer: Self-pay

## 2021-04-01 ENCOUNTER — Ambulatory Visit (INDEPENDENT_AMBULATORY_CARE_PROVIDER_SITE_OTHER): Payer: 59 | Admitting: Orthopaedic Surgery

## 2021-04-01 DIAGNOSIS — M5442 Lumbago with sciatica, left side: Secondary | ICD-10-CM

## 2021-04-01 DIAGNOSIS — M5441 Lumbago with sciatica, right side: Secondary | ICD-10-CM | POA: Diagnosis not present

## 2021-04-01 MED ORDER — TIZANIDINE HCL 4 MG PO TABS
4.0000 mg | ORAL_TABLET | Freq: Three times a day (TID) | ORAL | 1 refills | Status: DC | PRN
  Filled 2021-04-01: qty 30, 10d supply, fill #0
  Filled 2021-04-15: qty 30, 10d supply, fill #1

## 2021-04-01 MED ORDER — PREDNISONE 50 MG PO TABS
50.0000 mg | ORAL_TABLET | Freq: Every day | ORAL | 0 refills | Status: DC
  Filled 2021-04-01: qty 5, 5d supply, fill #0

## 2021-04-01 NOTE — Progress Notes (Signed)
Peter Keller is well-known to me.  He comes in today with debilitating low back pain and bilateral sciatica that is significantly worsened over a few days now.  He denies any change in bowel or bladder function but is getting numbness and tingling going down both legs which is new.  He appears uncomfortable overall is walking hunched over.  He had been working out on Sunday and did not have any specific injury but then as the day went on he developed these worsening symptoms and is gotten significantly worse now.  He has tried activity modification as well as anti-inflammatories and rest.  He is alternated ice and heat and tried some back extension exercises.  He currently denies any headache, chest pain, shortness of breath, fever, chills, nausea, vomiting  On exam he is very uncomfortable.  He has significant limitations in flexion extension of his lumbar spine due to pain.  He has a grossly positive straight leg raise bilaterally with severe pain and discomfort when I perform this exam on him bilaterally.  He has subjective numbness in L4 and L5 bilaterally.  He has weak foot dorsiflexion on the left side.  Given his clinical exam findings and presentation, a MRI of his lumbar spine is warranted to rule out herniated disc.  I am quite concerned based on how he looks today and his clinical exam/clinical exam findings.  I am going to put him on 5 days of prednisone at 50 mg and will try Zanaflex as a muscle relaxant.  He will alternate ice and heat and rest his back still.  We will work on ordering the MRI and then as soon as I have the results let him know what her neck step will be.  In 2019 he did have an epidural steroid in his lumbar spine by Dr. Alvester Morin and that helped greatly and he had no issues until this past Sunday.

## 2021-04-09 ENCOUNTER — Telehealth: Payer: Self-pay | Admitting: Orthopaedic Surgery

## 2021-04-09 NOTE — Telephone Encounter (Signed)
Called pt 1X and left a vm for pt to call and set an appt with Dr. Ninfa Linden or PA Carlis Abbott for MRI results after 2/25

## 2021-04-10 ENCOUNTER — Other Ambulatory Visit: Payer: Self-pay

## 2021-04-10 ENCOUNTER — Ambulatory Visit
Admission: RE | Admit: 2021-04-10 | Discharge: 2021-04-10 | Disposition: A | Discharge status: Home / Self Care | Payer: 59 | Source: Ambulatory Visit | Attending: Orthopaedic Surgery | Admitting: Orthopaedic Surgery

## 2021-04-10 DIAGNOSIS — M5441 Lumbago with sciatica, right side: Secondary | ICD-10-CM

## 2021-04-12 ENCOUNTER — Other Ambulatory Visit: Payer: Self-pay

## 2021-04-12 DIAGNOSIS — M5441 Lumbago with sciatica, right side: Secondary | ICD-10-CM

## 2021-04-14 ENCOUNTER — Other Ambulatory Visit (HOSPITAL_COMMUNITY): Payer: Self-pay

## 2021-04-15 ENCOUNTER — Other Ambulatory Visit (HOSPITAL_COMMUNITY): Payer: Self-pay

## 2021-04-19 ENCOUNTER — Ambulatory Visit (INDEPENDENT_AMBULATORY_CARE_PROVIDER_SITE_OTHER): Payer: 59 | Admitting: Orthopaedic Surgery

## 2021-04-19 ENCOUNTER — Other Ambulatory Visit (HOSPITAL_COMMUNITY): Payer: Self-pay

## 2021-04-19 ENCOUNTER — Encounter: Payer: Self-pay | Admitting: Orthopaedic Surgery

## 2021-04-19 DIAGNOSIS — M5442 Lumbago with sciatica, left side: Secondary | ICD-10-CM | POA: Diagnosis not present

## 2021-04-19 DIAGNOSIS — M5441 Lumbago with sciatica, right side: Secondary | ICD-10-CM

## 2021-04-19 MED ORDER — HYDROCODONE-ACETAMINOPHEN 5-325 MG PO TABS
1.0000 | ORAL_TABLET | Freq: Four times a day (QID) | ORAL | 0 refills | Status: DC | PRN
  Filled 2021-04-19: qty 30, 8d supply, fill #0

## 2021-04-19 MED ORDER — TIZANIDINE HCL 4 MG PO TABS
4.0000 mg | ORAL_TABLET | Freq: Three times a day (TID) | ORAL | 1 refills | Status: DC | PRN
  Filled 2021-04-19 – 2021-04-23 (×2): qty 30, 10d supply, fill #0

## 2021-04-19 NOTE — Progress Notes (Signed)
Peter Keller is here today to go over the MRI of the lumbar spine.  He is still having left-sided low back pain and radicular symptoms going down his left leg.  This has not changed much since I saw him last.  A steroid taper did help somewhat as well as back exercises.  He has had an epidural steroid injection by Dr. Alvester Morin in 2019.  At this point with failure conservative treatment for over 6 weeks including extension exercises, rest, and oral steroids, we are recommending once again an epidural steroid injection to the left side at the lower lumbar spine level. ? ?The MRI does correlate with the symptoms he is having in terms of disc desiccation to the left side at the lower lumbar spine.  Of note he cannot take oral anti-inflammatories having been on Plavix.  He is on muscle relaxants and pain medications and he is using these sparingly.  He still appears uncomfortable. ? ?This point we have ordered an epidural steroid injection through Dr. Alvester Morin section similar to what he had in 2019 which helped the patient greatly.  Given the failure of conservative treatment this is medically warranted at this standpoint. ?

## 2021-04-20 ENCOUNTER — Telehealth: Payer: Self-pay | Admitting: Physical Medicine and Rehabilitation

## 2021-04-20 NOTE — Telephone Encounter (Signed)
Patient returned call asked for a call back. # [phone] ?

## 2021-04-23 ENCOUNTER — Other Ambulatory Visit (HOSPITAL_COMMUNITY): Payer: Self-pay

## 2021-04-29 ENCOUNTER — Encounter: Payer: Self-pay | Admitting: Cardiovascular Disease

## 2021-04-29 ENCOUNTER — Other Ambulatory Visit: Payer: Self-pay | Admitting: Cardiovascular Disease

## 2021-04-29 ENCOUNTER — Other Ambulatory Visit (HOSPITAL_COMMUNITY): Payer: Self-pay

## 2021-04-29 ENCOUNTER — Other Ambulatory Visit: Payer: Self-pay

## 2021-04-29 ENCOUNTER — Ambulatory Visit: Payer: 59 | Admitting: Cardiovascular Disease

## 2021-04-29 VITALS — BP 118/80 | HR 100 | Ht 69.0 in | Wt 197.8 lb

## 2021-04-29 DIAGNOSIS — E1169 Type 2 diabetes mellitus with other specified complication: Secondary | ICD-10-CM | POA: Diagnosis not present

## 2021-04-29 DIAGNOSIS — I2510 Atherosclerotic heart disease of native coronary artery without angina pectoris: Secondary | ICD-10-CM

## 2021-04-29 DIAGNOSIS — R040 Epistaxis: Secondary | ICD-10-CM

## 2021-04-29 DIAGNOSIS — E785 Hyperlipidemia, unspecified: Secondary | ICD-10-CM | POA: Diagnosis not present

## 2021-04-29 MED ORDER — LOSARTAN POTASSIUM-HCTZ 100-25 MG PO TABS
1.0000 | ORAL_TABLET | Freq: Every day | ORAL | 3 refills | Status: DC
  Filled 2021-04-29: qty 90, 90d supply, fill #0
  Filled 2021-07-14: qty 90, 90d supply, fill #1
  Filled 2021-10-20: qty 90, 90d supply, fill #2
  Filled 2022-01-12: qty 90, 90d supply, fill #3

## 2021-04-29 MED ORDER — NEBIVOLOL HCL 10 MG PO TABS
10.0000 mg | ORAL_TABLET | Freq: Two times a day (BID) | ORAL | 3 refills | Status: DC
  Filled 2021-04-29: qty 180, 90d supply, fill #0
  Filled 2021-05-05: qty 75, 38d supply, fill #0
  Filled 2021-05-06: qty 105, 52d supply, fill #0
  Filled 2021-05-31: qty 180, 90d supply, fill #0

## 2021-04-29 NOTE — Progress Notes (Signed)
?Cardiology Office Note:   ? ?Date:  04/29/2021  ? ?ID:  Kenn File, DOB Jul 01, 1975, MRN [id number] ? ?PCP:  Myrlene Broker, MD ?  ?[facility] Providers ?Cardiologist:  Kristeen Miss, MD    ? ?Referring MD: Myrlene Broker, *  ? ?Chief Complaint  ?Patient presents with  ? Coronary Artery Disease  ?   ?  ? ? ?Oct. 24, 2022  ? ?Peter Keller is a 46 y.o. male with a hx of CAD , s/p stenting ?EF is normal - 60-65%. ? ?Peter Keller is seen today for follow up of his coronary artery disease and coronary stenting  ?VS look good  ?Is exercising 3-4 times a month .  ?Does elliptical for 20 min, then on the bike for 10 min. ?Had some chest pain on Oct. 1. ?Slight ,  ?Persistent CP ,  took a SL NTG  ?He thinks it may have been GERD .   Was on nexium previously  ?Wants to double his protonix .  ? ?Trigs were 434 - Vascepa was started at that time  ?Occasionally forgets his nighttime Vascepa.   ? ?We discussed increasing exercise  ?Diet has improved , less pizza ,  still eats bread  ?EF form June, 2022 shows EF 60-65% ? ? ?Having some muscle aches,  we discussed having him hold his Atorva and see if his muscle aches improve ?We will send him to the lipid clinic if the lipids are not improved or if he is not tolerating the atorvastatin. ? ?April 29, 2021 ? ?Hx of prox LAD stenting  ?Has moderate CAD elsewhere  ?Doing well ?Has only tried the NTG once - and he thinks its likely GERD ?The ASA is causing lots of GERD  ?We changed from Brilinta to Plavix - brilinta caused lots of bleeding with scratches and such  ?Is having lots of bloody nose issues  ? ?Rec. Appt with ENT ?Will DC ASA and cont plavix  ? ?Still having lots of neck pain - due to a partially ruptured disc.  ? ?Is on atorvastatin and fenofibrate 160 mg a day  ? ?We discussed the benefits of weight loss  ?Wt today is 197 lbs  ? ?HR is 100 today - on Bystolic 10 QD ?Will increase bystolic to 10 BID ?Admits to drinking too much - which may be the cause of his  resting tachycardia  ? ?Past Medical History:  ?Diagnosis Date  ? Alcohol dependence (HCC)   ? Diabetes (HCC)   ? GERD (gastroesophageal reflux disease)   ? Hepatic steatosis   ? Hyperlipidemia   ? Hypertension   ? Obesity   ? Sinus tachycardia   ? ? ?Past Surgical History:  ?Procedure Laterality Date  ? CORONARY STENT INTERVENTION N/A 07/24/2020  ? Procedure: CORONARY STENT INTERVENTION;  Surgeon: Kathleene Hazel, MD;  [facility];  Service: Cardiovascular;  Laterality: N/A;  ? INTRAVASCULAR ULTRASOUND/IVUS N/A 07/24/2020  ? Procedure: Intravascular Ultrasound/IVUS;  Surgeon: Kathleene Hazel, MD;  [facility]: [facility];  Service: Cardiovascular;  Laterality: N/A;  ? LEFT HEART CATH AND CORONARY ANGIOGRAPHY N/A 07/24/2020  ? Procedure: LEFT HEART CATH AND CORONARY ANGIOGRAPHY;  Surgeon: Kathleene Hazel, MD;  [facility]: [facility];  Service: Cardiovascular;  Laterality: N/A;  ? none    ? ? ?Current Medications: ?Current Meds  ?Medication Sig  ? aspirin 81 MG EC tablet Take 1 tablet (81 mg total) by mouth daily. Swallow whole.  ?  atorvastatin (LIPITOR) 40 MG tablet Take 1 tablet (40 mg total) by mouth daily.  ? Cholecalciferol (VITAMIN D) 400 UNITS capsule Take 500 Units by mouth daily.  ? clopidogrel (PLAVIX) 75 MG tablet Take 1 tablet (75 mg total) by mouth daily.  ? Continuous Blood Gluc Receiver (DEXCOM G6 RECEIVER) DEVI Use as instructed to check blood sugar  ? Continuous Blood Gluc Sensor (DEXCOM G6 SENSOR) MISC Use as instructed to check blood sugar change every 10 days  ? Continuous Blood Gluc Transmit (DEXCOM G6 TRANSMITTER) MISC Use as instructed to check blood sugar. Change every 90 days.  ? fenofibrate 160 MG tablet Take 1 tablet (160 mg total) by mouth daily.  ? HYDROcodone-acetaminophen (NORCO/VICODIN) 5-325 MG tablet Take 1 tablet by mouth every 6 (six) hours as needed for moderate pain.  ? JARDIANCE 25 MG TABS tablet Take 1 tablet (25 mg total) by  mouth daily.  ? metFORMIN (GLUCOPHAGE-XR) 500 MG 24 hr tablet Take 4 tablets (2,000 mg total) by mouth daily with breakfast.  ? methocarbamol (ROBAXIN) 750 MG tablet Take 1 tablet (750 mg total) by mouth every 8 (eight) hours as needed for muscle spasms.  ? nebivolol (BYSTOLIC) 10 MG tablet Take 1 tablet (10 mg total) by mouth 2 (two) times daily.  ? nitroGLYCERIN (NITROSTAT) 0.4 MG SL tablet Place 1 tablet under the tongue every 5 minutes for 3 doses as needed for chest pain  ? pantoprazole (PROTONIX) 40 MG tablet Take 1 tablet (40 mg total) by mouth 2 (two) times daily.  ? tirzepatide (MOUNJARO) 7.5 MG/0.5ML Pen Inject 7.5 mg into the skin once a week.  ? [DISCONTINUED] losartan-hydrochlorothiazide (HYZAAR) 100-25 MG tablet Take 1 tablet by mouth daily.  ? [DISCONTINUED] nebivolol (BYSTOLIC) 10 MG tablet Take 1 tablet (10 mg total) by mouth daily.  ?  ? ?Allergies:   Patient has no known allergies.  ? ?Social History  ? ?Socioeconomic History  ? Marital status: Married  ?  Spouse name: Not on file  ? Number of children: Not on file  ? Years of education: Not on file  ? Highest education level: Not on file  ?Occupational History  ? Not on file  ?Tobacco Use  ? Smoking status: Never  ? Smokeless tobacco: Never  ?Substance and Sexual Activity  ? Alcohol use: Yes  ?  Comment: 3 alcoholic drinks daily  ? Drug use: No  ? Sexual activity: Not on file  ?Other Topics Concern  ? Not on file  ?Social History Narrative  ? Not on file  ? ?Social Determinants of Health  ? ?Financial Resource Strain: Not on file  ?Food Insecurity: Not on file  ?Transportation Needs: Not on file  ?Physical Activity: Not on file  ?Stress: Not on file  ?Social Connections: Not on file  ?  ? ?Family History: ?The patient's family history includes Diabetes in his mother; Heart attack in his paternal grandmother; Heart disease in his paternal uncle; Hypertension in his mother. There is no history of Colon cancer, Esophageal cancer, or Stomach  cancer. ? ?ROS:   ?Please see the history of present illness.    ? All other systems reviewed and are negative. ? ?EKGs/Labs/Other Studies Reviewed:   ? ?The following studies were reviewed today: ? ? ?EKG:     April 29, 2021 ?Sinus tach at 100 ,   Inf. Q waves - ? Previous Inf. Mi ( his RCA was normal at cath, may be from the terminal LAD which wraps around the  apex and supplies the inferior apical wall )  ? ?Recent Labs: ?01/12/2021: Hemoglobin 16.6; Platelets 163 ?03/17/2021: ALT 61; BUN 17; Creat 0.78; Potassium 3.8; Sodium 138  ?Recent Lipid Panel ?   ?Component Value Date/Time  ? CHOL 145 03/17/2021 1017  ? CHOL 177 01/12/2021 1036  ? TRIG 262.0 (H) 03/17/2021 1017  ? HDL 31.80 (L) 03/17/2021 1017  ? HDL 25 (L) 01/12/2021 1036  ? CHOLHDL 5 03/17/2021 1017  ? VLDL 52.4 (H) 03/17/2021 1017  ? LDLCALC Comment (A) 01/12/2021 1036  ? LDLDIRECT 91.0 03/17/2021 1017  ? ? ? ?Risk Assessment/Calculations:   ?  ? ?    ? ?Physical Exam:   ? ? ?Physical Exam: ?Blood pressure 118/80, pulse 100, height 5\' 9"  (1.753 m), weight 197 lb 12.8 oz (89.7 kg), SpO2 94 %. ? ?GEN:  Well nourished, well developed in no acute distress ?HEENT: Normal ?NECK: No JVD; No carotid bruits ?LYMPHATICS: No lymphadenopathy ?CARDIAC: RRR , no murmurs, rubs, gallops ?RESPIRATORY:  Clear to auscultation without rales, wheezing or rhonchi  ?ABDOMEN: Soft, non-tender, non-distended ?MUSCULOSKELETAL:  No edema; No deformity  ?SKIN: Warm and dry ?NEUROLOGIC:  Alert and oriented x 3 ? ? ?ASSESSMENT:   ? ?1. Hyperlipidemia associated with type 2 diabetes mellitus (HCC)   ?2. Coronary artery disease involving native coronary artery of native heart without angina pectoris   ?3. Bleeding from the nose   ? ? ?PLAN:   ? ? ? ?1.  Coronary artery disease:    no angana.  He is having lots of problems with nosebleeds.  He also is having lots of GERD from his aspirin.  I think that at this point we can discontinue aspirin and continue Plavix.  We will refer him to  ENT to see if there is a small capillary leak that can be cauterized to resolve this nosebleed. ? ?We will have him follow-up in 3 months to see me or an APP.  At that time we can determine if he is cleared to hold hi

## 2021-04-29 NOTE — Progress Notes (Signed)
Per Dr. Acie Fredrickson, referral to ENT from today's office visit. Provider in previous order no longer practicing. Updated referral order to [facility]. ?

## 2021-04-29 NOTE — Patient Instructions (Addendum)
Medication Instructions:  ?Your physician has recommended you make the following change in your medication: ? ?1) STOP Aspirin ?2) CHANGE Bystolic 10mg  twice daily ? ?*If you need a refill on your cardiac medications before your next appointment, please call your pharmacy* ? ?Lab Work: ?NONE ?If you have labs (blood work) drawn today and your tests are completely normal, you will receive your results only by: ?MyChart Message (if you have MyChart) OR ?A paper copy in the mail ?If you have any lab test that is abnormal or we need to change your treatment, we will call you to review the results. ? ?Testing/Procedures: ?NONE ? ?Follow-Up: ?At [facility], you and your health needs are our priority.  As part of our continuing mission to provide you with exceptional heart care, we have created designated Provider Care Teams.  These Care Teams include your primary Cardiologist (physician) and Advanced Practice Providers (APPs -  Physician Assistants and Nurse Practitioners) who all work together to provide you with the care you need, when you need it. ? ?Your next appointment:   ?3 month(s) ? ?The format for your next appointment:   ?In Person ? ?Provider:   ?Mertie Moores, MD  or Robbie Lis, PA-C or Richardson Dopp, [place] ? ?Other Instructions ?Your physician has referred you to a ear/nose/throat MD. They will call to schedule an appointment. ?

## 2021-05-05 ENCOUNTER — Other Ambulatory Visit (HOSPITAL_COMMUNITY): Payer: Self-pay

## 2021-05-05 ENCOUNTER — Other Ambulatory Visit: Payer: Self-pay | Admitting: Medical

## 2021-05-05 MED ORDER — ATORVASTATIN CALCIUM 40 MG PO TABS
40.0000 mg | ORAL_TABLET | Freq: Every day | ORAL | 3 refills | Status: DC
Start: 2021-05-05 — End: 2021-06-22
  Filled 2021-05-05: qty 90, 90d supply, fill #0

## 2021-05-06 ENCOUNTER — Other Ambulatory Visit (HOSPITAL_COMMUNITY): Payer: Self-pay

## 2021-05-07 ENCOUNTER — Other Ambulatory Visit (HOSPITAL_COMMUNITY): Payer: Self-pay

## 2021-05-10 ENCOUNTER — Ambulatory Visit: Payer: Self-pay

## 2021-05-10 ENCOUNTER — Encounter: Payer: Self-pay | Admitting: Physical Medicine and Rehabilitation

## 2021-05-10 ENCOUNTER — Ambulatory Visit (INDEPENDENT_AMBULATORY_CARE_PROVIDER_SITE_OTHER): Payer: 59 | Admitting: Physical Medicine and Rehabilitation

## 2021-05-10 ENCOUNTER — Other Ambulatory Visit: Payer: Self-pay | Admitting: Internal Medicine

## 2021-05-10 ENCOUNTER — Other Ambulatory Visit: Payer: Self-pay

## 2021-05-10 ENCOUNTER — Other Ambulatory Visit (HOSPITAL_COMMUNITY): Payer: Self-pay

## 2021-05-10 ENCOUNTER — Other Ambulatory Visit (HOSPITAL_BASED_OUTPATIENT_CLINIC_OR_DEPARTMENT_OTHER): Payer: Self-pay

## 2021-05-10 VITALS — BP 133/90 | HR 92

## 2021-05-10 DIAGNOSIS — M5412 Radiculopathy, cervical region: Secondary | ICD-10-CM | POA: Diagnosis not present

## 2021-05-10 DIAGNOSIS — M545 Low back pain, unspecified: Secondary | ICD-10-CM

## 2021-05-10 DIAGNOSIS — M542 Cervicalgia: Secondary | ICD-10-CM | POA: Diagnosis not present

## 2021-05-10 DIAGNOSIS — M47816 Spondylosis without myelopathy or radiculopathy, lumbar region: Secondary | ICD-10-CM | POA: Diagnosis not present

## 2021-05-10 DIAGNOSIS — M5416 Radiculopathy, lumbar region: Secondary | ICD-10-CM | POA: Diagnosis not present

## 2021-05-10 DIAGNOSIS — E1169 Type 2 diabetes mellitus with other specified complication: Secondary | ICD-10-CM

## 2021-05-10 DIAGNOSIS — M5010 Cervical disc disorder with radiculopathy, unspecified cervical region: Secondary | ICD-10-CM

## 2021-05-10 DIAGNOSIS — G8929 Other chronic pain: Secondary | ICD-10-CM

## 2021-05-10 MED ORDER — METHYLPREDNISOLONE ACETATE 80 MG/ML IJ SUSP
80.0000 mg | Freq: Once | INTRAMUSCULAR | Status: AC
  Administered 2021-05-10: 80 mg

## 2021-05-10 MED ORDER — TIRZEPATIDE 10 MG/0.5ML ~~LOC~~ SOAJ
10.0000 mg | SUBCUTANEOUS | 3 refills | Status: DC
  Filled 2021-05-10: qty 6, 84d supply, fill #0
  Filled 2021-05-10: qty 2, 28d supply, fill #0
  Filled 2021-07-14: qty 6, 84d supply, fill #1
  Filled 2021-10-20: qty 6, 84d supply, fill #2
  Filled 2022-01-12: qty 6, 84d supply, fill #3

## 2021-05-10 NOTE — Patient Instructions (Signed)

## 2021-05-10 NOTE — Progress Notes (Signed)
Pt state lower back pain that travels down both legs. Pt state getting out of bed makes the pain worse. Pt state he uses icy hot, pain meds and back brace to help ease his pain. ? ?Numeric Pain Rating Scale and Functional Assessment ?Average Pain 3 ? ? ?In the last MONTH (on 0-10 scale) has pain interfered with the following? ? ?1. General activity like being  able to carry out your everyday physical activities such as walking, climbing stairs, carrying groceries, or moving a chair?  ?Rating(9) ? ? ?+Driver, +BT, -Dye Allergies. ? ?

## 2021-05-14 ENCOUNTER — Ambulatory Visit (HOSPITAL_COMMUNITY)
Admission: EM | Admit: 2021-05-14 | Discharge: 2021-05-14 | Disposition: A | Discharge status: Home / Self Care | Payer: 59 | Attending: Nurse Practitioner | Admitting: Nurse Practitioner

## 2021-05-14 ENCOUNTER — Other Ambulatory Visit (HOSPITAL_COMMUNITY): Payer: Self-pay

## 2021-05-14 ENCOUNTER — Encounter (HOSPITAL_COMMUNITY): Payer: Self-pay | Admitting: Emergency Medicine

## 2021-05-14 DIAGNOSIS — M25511 Pain in right shoulder: Secondary | ICD-10-CM | POA: Diagnosis not present

## 2021-05-14 DIAGNOSIS — M542 Cervicalgia: Secondary | ICD-10-CM | POA: Diagnosis not present

## 2021-05-14 DIAGNOSIS — M25512 Pain in left shoulder: Secondary | ICD-10-CM

## 2021-05-14 MED ORDER — PREDNISONE 50 MG PO TABS
50.0000 mg | ORAL_TABLET | Freq: Every day | ORAL | 0 refills | Status: DC
  Filled 2021-05-14: qty 5, 5d supply, fill #0

## 2021-05-14 MED ORDER — TRAMADOL HCL 50 MG PO TABS
50.0000 mg | ORAL_TABLET | Freq: Two times a day (BID) | ORAL | 0 refills | Status: AC | PRN
  Filled 2021-05-14: qty 10, 5d supply, fill #0

## 2021-05-14 NOTE — Discharge Instructions (Addendum)
Take medication as prescribed. ?Apply ice or heat as needed.  Use heat for spasm and stiffness, use ice for pain and swelling.  Apply for 20 minutes, remove for 1 hour, then repeat. ?Gentle range of motion exercises to the neck and shoulder to keep the joints mobile. ?Follow-up with orthopedics and cardiology within the next 5 to 7 days. ?

## 2021-05-14 NOTE — ED Provider Notes (Signed)
?[facility] ? ? ? ?CSN: [id number] ?Arrival date & time: 05/14/21  1313 ? ? ?  ? ?History   ?Chief Complaint ?Chief Complaint  ?Patient presents with  ? Shoulder Pain  ? Neck Pain  ? ? ?HPI ?Aadin Chaffin is a 46 y.o. male.  ? ?The patient is a 46 year old male who presents with his wife for complaints of neck pain.  Patient states symptoms started 2 days ago after waking from a nap..  Over the past 24 hours, he has noticed pain that has radiated down into his bilateral shoulders.  He states the pain is worse in the left shoulder.  He is concerned because he does have a cardiac history, he has been off of his Plavix for several days, was told by his cardiologist to come in for an EKG.  He complains of pain with movement in the left neck.  He also has some pain when moving his bilateral shoulders.  He denies radiation of pain into the right upper extremity.  He also denies injury, trauma, shortness of breath, difficulty breathing, nausea, vomiting, or diaphoresis.  He also states that his EKG on his watch reported a heart rate of 100 earlier today.  The patient has taken Excedrin, tramadol, tizanidine, nitro with minimal relief.  He does have a history of disc protrusion at C6-C7 based on an MRI that was performed last year in July.  He states that he has not decided with his orthopedic surgeon when to have surgery on his neck because he is on blood thinners. ? ?The history is provided by the patient and the spouse.  ?Neck Pain ? ?Past Medical History:  ?Diagnosis Date  ? Alcohol dependence (HCC)   ? Diabetes (HCC)   ? GERD (gastroesophageal reflux disease)   ? Hepatic steatosis   ? Hyperlipidemia   ? Hypertension   ? Obesity   ? Sinus tachycardia   ? ? ?Patient Active Problem List  ? Diagnosis Date Noted  ? Chest pain 07/25/2020  ? Coronary artery disease 07/25/2020  ? Unstable angina (HCC) 07/24/2020  ? Chest pain of uncertain etiology 06/05/2020  ? Routine general medical examination at a health care  facility 04/07/2015  ? Essential hypertension, benign 08/29/2014  ? Type 2 diabetes mellitus with hyperlipidemia (HCC) 08/29/2014  ? Fatty liver 08/29/2014  ? Snoring 12/27/2010  ? Alcohol dependence (HCC) 12/27/2010  ? Hyperlipidemia associated with type 2 diabetes mellitus (HCC) 12/27/2010  ? Obesity 07/17/2009  ? GERD 07/17/2009  ? ? ?Past Surgical History:  ?Procedure Laterality Date  ? CORONARY STENT INTERVENTION N/A 07/24/2020  ? Procedure: CORONARY STENT INTERVENTION;  Surgeon: Kathleene HazelMcAlhany, Christopher D, MD;  [facility];  Service: Cardiovascular;  Laterality: N/A;  ? INTRAVASCULAR ULTRASOUND/IVUS N/A 07/24/2020  ? Procedure: Intravascular Ultrasound/IVUS;  Surgeon: Kathleene HazelMcAlhany, Christopher D, MD;  [facility]: [facility];  Service: Cardiovascular;  Laterality: N/A;  ? LEFT HEART CATH AND CORONARY ANGIOGRAPHY N/A 07/24/2020  ? Procedure: LEFT HEART CATH AND CORONARY ANGIOGRAPHY;  Surgeon: Kathleene HazelMcAlhany, Christopher D, MD;  [facility]: [facility];  Service: Cardiovascular;  Laterality: N/A;  ? none    ? ? ? ? ? ?Home Medications   ? ?Prior to Admission medications   ?Medication Sig Start Date End Date Taking? Authorizing Provider  ?predniSONE (DELTASONE) 50 MG tablet Take 1 tablet (50 mg total) by mouth daily with breakfast. 05/14/21  Yes Ole Lafon-Warren, Sadie Haberhristie J, NP  ?traMADol (ULTRAM) 50 MG tablet Take 1 tablet (50  mg total) by mouth every 12 (twelve) hours as needed for up to 5 days. 05/14/21 05/19/21 Yes Daniqua Campoy-Warren, Sadie Haber, NP  ?aspirin 81 MG EC tablet Take 1 tablet (81 mg total) by mouth daily. Swallow whole. 07/25/20   Kroeger, Ovidio Kin., PA-C  ?atorvastatin (LIPITOR) 40 MG tablet Take 1 tablet (40 mg total) by mouth daily. 05/05/21   Nahser, Deloris Ping, MD  ?Cholecalciferol (VITAMIN D) 400 UNITS capsule Take 500 Units by mouth daily.    [provider]  ?clopidogrel (PLAVIX) 75 MG tablet Take 1 tablet (75 mg total) by mouth daily. 09/09/20   Nahser, Deloris Ping, MD  ?Continuous Blood  Gluc Receiver (DEXCOM G6 RECEIVER) DEVI Use as instructed to check blood sugar 02/26/21   Carlus Pavlov, MD  ?Continuous Blood Gluc Sensor (DEXCOM G6 SENSOR) MISC Use as instructed to check blood sugar change every 10 days 02/26/21   Carlus Pavlov, MD  ?Continuous Blood Gluc Transmit (DEXCOM G6 TRANSMITTER) MISC Use as instructed to check blood sugar. Change every 90 days. 02/26/21   Carlus Pavlov, MD  ?fenofibrate 160 MG tablet Take 1 tablet (160 mg total) by mouth daily. 07/25/20   Kroeger, Ovidio Kin., PA-C  ?HYDROcodone-acetaminophen (NORCO/VICODIN) 5-325 MG tablet Take 1 tablet by mouth every 6 (six) hours as needed for moderate pain. 04/19/21   Kathryne Hitch, MD  ?JARDIANCE 25 MG TABS tablet Take 1 tablet (25 mg total) by mouth daily. 02/26/21   Carlus Pavlov, MD  ?losartan-hydrochlorothiazide (HYZAAR) 100-25 MG tablet Take 1 tablet by mouth daily. 04/29/21   Nahser, Deloris Ping, MD  ?metFORMIN (GLUCOPHAGE-XR) 500 MG 24 hr tablet Take 4 tablets (2,000 mg total) by mouth daily with breakfast. 02/26/21   Carlus Pavlov, MD  ?methocarbamol (ROBAXIN) 750 MG tablet Take 1 tablet (750 mg total) by mouth every 8 (eight) hours as needed for muscle spasms. 08/18/20   Kathryne Hitch, MD  ?methylPREDNISolone (MEDROL) 4 MG tablet Medrol dose pack. Take as instructed ?Patient not taking: Reported on 04/29/2021 09/09/20   Kathryne Hitch, MD  ?nebivolol (BYSTOLIC) 10 MG tablet Take 1 tablet (10 mg total) by mouth 2 (two) times daily. 04/29/21   Nahser, Deloris Ping, MD  ?nitroGLYCERIN (NITROSTAT) 0.4 MG SL tablet Place 1 tablet (0.4 mg total) under the tongue every 5 (five) minutes x 3 doses as needed for chest pain. ?Patient not taking: Reported on 04/29/2021 08/04/20   Ronney Asters, NP  ?nitroGLYCERIN (NITROSTAT) 0.4 MG SL tablet Place 1 tablet under the tongue every 5 minutes for 3 doses as needed for chest pain 07/25/20     ?pantoprazole (PROTONIX) 40 MG tablet Take 1 tablet (40 mg total) by  mouth 2 (two) times daily. 12/07/20   Nahser, Deloris Ping, MD  ?tirzepatide [facility]) 10 MG/0.5ML Pen Inject 10 mg into the skin once a week. 05/10/21   Carlus Pavlov, MD  ?tiZANidine (ZANAFLEX) 4 MG tablet Take 1 tablet (4 mg total) by mouth every 8 (eight) hours as needed for muscle spasms. ?Patient not taking: Reported on 04/29/2021 04/19/21   Kathryne Hitch, MD  ? ? ?Family History ?Family History  ?Problem Relation Age of Onset  ? Hypertension Mother   ? Diabetes Mother   ? Heart disease Paternal Uncle   ? Heart attack Paternal Grandmother   ? Colon cancer Neg Hx   ? Esophageal cancer Neg Hx   ? Stomach cancer Neg Hx   ? ? ?Social History ?Social History  ? ?Tobacco Use  ?  Smoking status: Never  ? Smokeless tobacco: Never  ?Substance Use Topics  ? Alcohol use: Yes  ?  Comment: 3 alcoholic drinks daily  ? Drug use: No  ? ? ? ?Allergies   ?Patient has no known allergies. ? ? ?Review of Systems ?Review of Systems  ?Constitutional: Negative.   ?Respiratory: Negative.    ?Cardiovascular: Negative.   ?Musculoskeletal:  Positive for neck pain and neck stiffness.  ?     Bilateral shoulder pain  ?Skin: Negative.   ?Psychiatric/Behavioral: Negative.    ? ? ?Physical Exam ?Triage Vital Signs ?ED Triage Vitals  ?Enc Vitals Group  ?   BP 05/14/21 1357 122/71  ?   Pulse Rate 05/14/21 1357 99  ?   Resp 05/14/21 1357 18  ?   Temp 05/14/21 1357 97.8 ?F (36.6 ?C)  ?   Temp Source 05/14/21 1357 Oral  ?   SpO2 05/14/21 1357 97 %  ?   Weight --   ?   Height --   ?   Head Circumference --   ?   Peak Flow --   ?   Pain Score 05/14/21 1353 9  ?   Pain Loc --   ?   Pain Edu? --   ?   Excl. in GC? --   ? ?No data found. ? ?Updated Vital Signs ?BP 122/71 (BP Location: Right Arm)   Pulse 99   Temp 97.8 ?F (36.6 ?C) (Oral)   Resp 18   SpO2 97%  ? ?Visual Acuity ?Right Eye Distance:   ?Left Eye Distance:   ?Bilateral Distance:   ? ?Right Eye Near:   ?Left Eye Near:    ?Bilateral Near:    ? ?Physical Exam ?Vitals reviewed.   ?Constitutional:   ?   General: He is not in acute distress. ?   Appearance: Normal appearance.  ?HENT:  ?   Head: Normocephalic and atraumatic.  ?Eyes:  ?   Extraocular Movements: Extraocular movements intact.  ?   Con

## 2021-05-14 NOTE — ED Triage Notes (Addendum)
Patient  c/o  Bilateral shoulder pain and posterior neck pain x 2 days.  ? ?Patient denies fall or trauma. ? ?Patient endorses pain upon waking from nap.  ? ?Patient endorses pain radiated to LFT arm at times.  ? ?Patient endorses having steroid injection in lower back on Monday.  ? ?Patient states " I'm concerned because I've stopped taking my Plavix medication for several days, I had a stent placed in June of 2022, my cardiologist wants me to come in and get EKG done and verify".  ? ?Patient has taken Tylenol, Excedrin, and Nitroglycerin with no relief of symptoms.  ? ?History of Spinal Issues in Cervical Region.  ?

## 2021-05-16 NOTE — Progress Notes (Signed)
? ?Peter Keller - 46 y.o. male MRN [id number]  Date of birth: 1975/08/07 ? ?Office Visit Note: ?Visit Date: 05/10/2021 ?PCP: Hoyt Koch, MD ?Referred by: Hoyt Koch, * ? ?Subjective: ?Chief Complaint  ?Patient presents with  ? Lower Back - Pain  ? Right Leg - Pain  ? Left Leg - Pain  ? ?HPI: Peter Keller is a 46 y.o. male who comes in today at the request of Dr. Jean Rosenthal for essentially 2 distinct problems.  Patient is having chronic worsening severe mostly axial low back pain but with some referral into the posterior buttocks and legs.  This is similar to a prior condition he had a couple years ago when we saw him for bilateral facet joint injections that gave him quite a bit of relief up until just the last couple of months.  He reports no specific injury.  After failing conservative care with medication management and time MRI of the lumbar spine was obtained.  This is reviewed with him today and reviewed below in the documentation.  Essentially a stable MRI from 2019 showing degenerative changes at L5-S1 with some facet arthropathy.  Dr. Ninfa Linden wondered about epidural injection.  We had a long discussion today with the patient and decided to complete repeat facet joint block which helped him quite a bit.  His case is complicated by anxiety as well as type 2 diabetes.  He has no focal weakness.  No red flag complaints. ? ?He also reports today history of chronic now recent right neck and shoulder blade pain.  In the midst of getting imaging for the cervical problem it was discovered that he did have some coronary blockage.  Since have seen him last he has undergone stenting and is on Plavix anticoagulation.  His pain was mainly in the right side of the neck and shoulder blade some referral in the shoulder.  Nothing into the hands.  No paresthesias.  He is actually gotten a lot better over time with the shoulder pain.  He did have some exercises and physical therapy to do and  medication management.  MRI of the cervical spine reviewed again with him today as well.  He had a right foraminal disc protrusion at C6-7 with severe foraminal narrowing.  No high-grade central stenosis or spinal cord impact. ? ? ? ?Review of Systems  ?Musculoskeletal:  Positive for back pain, joint pain and neck pain.  ?All other systems reviewed and are negative. Otherwise per HPI. ? ?Assessment & Plan: ?Visit Diagnoses:  ?  ICD-10-CM   ?1. Spondylosis without myelopathy or radiculopathy, lumbar region  M47.816 XR C-ARM NO REPORT  ?  Epidural Steroid injection  ?  methylPREDNISolone acetate (DEPO-MEDROL) injection 80 mg  ?  ?2. Chronic bilateral low back pain without sciatica  M54.50   ? G89.29   ?  ?3. Cervical radiculopathy  M54.12   ?  ?4. Cervicalgia  M54.2   ?  ?5. Cervical disc disorder with radiculopathy  M50.10   ?  ?   ?Plan: Findings:  ?1.  Chronic worsening severe and recurrent mostly axial low back pain some referral into the posterior hips and posterior thighs.  Clinical scenario most consistent with facet mediated pain as in the past.  Patient does wish to repeat that injection since it helped.  If this does not help would look at bilateral S1 transforaminal injection for the degenerative changes of the L5-S1 disc with some changes to the endplates.  Patient has been off  Plavix.  I did discuss with him that we really like to be the ones to decide when to come off the medication as at least 4 transforaminal injections or facet joint injections we really do not have to have him off of the Plavix.  He will return to taking that today. ? ?2.  Chronic worsening severe right neck and shoulder blade pain with some referral in the upper arm.  Could be likely to the cervical foraminal narrowing and disc herniation at C6/7.  Could be myofascial pain as well or a combination.  Right now he is doing okay would have him come back depending on symptoms and we can fully evaluate that and consider cervical epidural  injection.  ? ?Meds & Orders:  ?Meds ordered this encounter  ?Medications  ? methylPREDNISolone acetate (DEPO-MEDROL) injection 80 mg  ?  ?Orders Placed This Encounter  ?Procedures  ? XR C-ARM NO REPORT  ? Epidural Steroid injection  ?  ?Follow-up: Return if symptoms worsen or fail to improve.  ? ?Procedures: ?No procedures performed  ?Lumbar Facet Joint Intra-Articular Injection(s) with Fluoroscopic Guidance ? ?Patient: Peter Keller      ?Date of Birth: 11/23/1975 ?MRN: [id number] ?PCP: Hoyt Koch, MD      ?Visit Date: 05/10/2021 ?  ?Universal Protocol:    ?Date/Time: 05/10/2021 ? ?Consent Given By: the patient ? ?Position: PRONE  ? ?Additional Comments: ?Vital signs were monitored before and after the procedure. ?Patient was prepped and draped in the usual sterile fashion. ?The correct patient, procedure, and site was verified. ? ? ?Injection Procedure Details:  ?Procedure Site One ?Meds Administered:  ?Meds ordered this encounter  ?Medications  ? methylPREDNISolone acetate (DEPO-MEDROL) injection 80 mg  ?  ? ?Laterality: Bilateral ? ?Location/Site:  ?L5-S1 ? ?Needle size: 22 guage ? ?Needle type: Spinal ? ?Needle Placement: Articular ? ?Findings: ? -Comments: Excellent flow of contrast producing a partial arthrogram. ? ?Procedure Details: ?The fluoroscope beam is vertically oriented in AP, and the inferior recess is visualized beneath the lower pole of the inferior apophyseal process, which represents the target point for needle insertion. When direct visualization is difficult the target point is located at the medial projection of the vertebral pedicle. The region overlying each aforementioned target is locally anesthetized with a 1 to 2 ml. volume of 1% Lidocaine without Epinephrine.  ? ?The spinal needle was inserted into each of the above mentioned facet joints using biplanar fluoroscopic guidance. A 0.25 to 0.5 ml. volume of Isovue-250 was injected and a partial facet joint arthrogram was obtained.  A single spot film was obtained of the resulting arthrogram.    One to 1.25 ml of the steroid/anesthetic solution was then injected into each of the facet joints noted above. ? ? ?Additional Comments:  ?The patient tolerated the procedure well ?Dressing: 2 x 2 sterile gauze and Band-Aid ?  ? ?Post-procedure details: ?Patient was observed during the procedure. ?Post-procedure instructions were reviewed. ? ?Patient left the clinic in stable condition. ?   ? ?Clinical History: ?MRI LUMBAR SPINE WITHOUT CONTRAST ?  ?TECHNIQUE: ?Multiplanar, multisequence MR imaging of the lumbar spine was ?performed. No intravenous contrast was administered. ?  ?COMPARISON:  Lumbar radiographs 11/29/2017. MRI 12/08/2017. ?  ?FINDINGS: ?Segmentation: Normal on the radiographs, the same numbering system ?used in 2019. ?  ?Alignment:  Chronic straightening lordosis. No spondylolisthesis. ?  ?Vertebrae: Chronic degenerative endplate marrow signal changes at ?L5-S1. Background bone marrow signal is within normal limits at 3 ?[employer] imaging. No marrow  edema or evidence of acute osseous ?abnormality. Intact visible sacrum. ?  ?Conus medullaris and cauda equina: Conus extends to the T12-L1 ?level. No lower spinal cord or conus signal abnormality. Capacious ?spinal canal. Cauda equina nerve roots appear normal. ?  ?Paraspinal and other soft tissues: Negative. ?  ?Disc levels: ?  ?T11-T12: Chronic disc desiccation. Minimal disc bulging. No ?stenosis. ?  ?T12-L1:  Negative. ?  ?L1-L2:  Negative. ?  ?L2-L3:  Stable subtle disc bulging. No stenosis. ?  ?L3-L4:  Negative. ?  ?L4-L5: Subtle far lateral disc bulging. Mild chronic facet ?hypertrophy although trace facet joint fluid has regressed since ?2019. No stenosis. ?  ?L5-S1: Chronic disc desiccation and mild disc bulging, endplate ?spurring. A posterior annular fissure of the disc is less apparent. ?Stable mild facet hypertrophy and trace facet joint fluid. No spinal ?or lateral recess stenosis.  And no convincing foraminal stenosis. ?  ?IMPRESSION: ?1. Essentially stable MRI appearance of the lumbar spine since 2019. ?2. Chronic disc and endplate degeneration at L5-S1. Mild facet ?degeneration th

## 2021-05-16 NOTE — Procedures (Signed)
Lumbar Facet Joint Intra-Articular Injection(s) with Fluoroscopic Guidance ? ?Patient: Peter Keller      ?Date of Birth: 1975/08/20 ?MRN: [id number] ?PCP: Hoyt Koch, MD      ?Visit Date: 05/10/2021 ?  ?Universal Protocol:    ?Date/Time: 05/10/2021 ? ?Consent Given By: the patient ? ?Position: PRONE  ? ?Additional Comments: ?Vital signs were monitored before and after the procedure. ?Patient was prepped and draped in the usual sterile fashion. ?The correct patient, procedure, and site was verified. ? ? ?Injection Procedure Details:  ?Procedure Site One ?Meds Administered:  ?Meds ordered this encounter  ?Medications  ? methylPREDNISolone acetate (DEPO-MEDROL) injection 80 mg  ?  ? ?Laterality: Bilateral ? ?Location/Site:  ?L5-S1 ? ?Needle size: 22 guage ? ?Needle type: Spinal ? ?Needle Placement: Articular ? ?Findings: ? -Comments: Excellent flow of contrast producing a partial arthrogram. ? ?Procedure Details: ?The fluoroscope beam is vertically oriented in AP, and the inferior recess is visualized beneath the lower pole of the inferior apophyseal process, which represents the target point for needle insertion. When direct visualization is difficult the target point is located at the medial projection of the vertebral pedicle. The region overlying each aforementioned target is locally anesthetized with a 1 to 2 ml. volume of 1% Lidocaine without Epinephrine.  ? ?The spinal needle was inserted into each of the above mentioned facet joints using biplanar fluoroscopic guidance. A 0.25 to 0.5 ml. volume of Isovue-250 was injected and a partial facet joint arthrogram was obtained. A single spot film was obtained of the resulting arthrogram.    One to 1.25 ml of the steroid/anesthetic solution was then injected into each of the facet joints noted above. ? ? ?Additional Comments:  ?The patient tolerated the procedure well ?Dressing: 2 x 2 sterile gauze and Band-Aid ?  ? ?Post-procedure details: ?Patient was  observed during the procedure. ?Post-procedure instructions were reviewed. ? ?Patient left the clinic in stable condition. ?  ?

## 2021-05-31 ENCOUNTER — Other Ambulatory Visit: Payer: Self-pay | Admitting: Cardiovascular Disease

## 2021-05-31 ENCOUNTER — Other Ambulatory Visit (HOSPITAL_COMMUNITY): Payer: Self-pay

## 2021-05-31 MED ORDER — CLOPIDOGREL BISULFATE 75 MG PO TABS
75.0000 mg | ORAL_TABLET | Freq: Every day | ORAL | 1 refills | Status: DC
  Filled 2021-05-31: qty 90, 90d supply, fill #0

## 2021-06-01 ENCOUNTER — Other Ambulatory Visit (HOSPITAL_COMMUNITY): Payer: Self-pay

## 2021-06-18 ENCOUNTER — Other Ambulatory Visit: Payer: Self-pay

## 2021-06-18 ENCOUNTER — Other Ambulatory Visit: Payer: Self-pay | Admitting: Internal Medicine

## 2021-06-18 ENCOUNTER — Other Ambulatory Visit (INDEPENDENT_AMBULATORY_CARE_PROVIDER_SITE_OTHER): Payer: 59

## 2021-06-18 DIAGNOSIS — E1169 Type 2 diabetes mellitus with other specified complication: Secondary | ICD-10-CM

## 2021-06-18 DIAGNOSIS — E785 Hyperlipidemia, unspecified: Secondary | ICD-10-CM

## 2021-06-18 LAB — LDL CHOLESTEROL, DIRECT: Direct LDL: 94 mg/dL

## 2021-06-18 LAB — LIPID PANEL
Cholesterol: 147 mg/dL (ref 0–200)
HDL: 36.1 mg/dL — ABNORMAL LOW (ref >39.00)
NonHDL: 110.94
Total CHOL/HDL Ratio: 4
Triglycerides: 211 mg/dL — ABNORMAL HIGH (ref 0.0–149.0)
VLDL: 42.2 mg/dL — ABNORMAL HIGH (ref 0.0–40.0)

## 2021-06-18 LAB — COMPREHENSIVE METABOLIC PANEL
ALT: 36 U/L (ref 0–53)
AST: 63 U/L — ABNORMAL HIGH (ref 0–37)
Albumin: 4.8 g/dL (ref 3.5–5.2)
Alkaline Phosphatase: 38 U/L — ABNORMAL LOW (ref 39–117)
BUN: 15 mg/dL (ref 6–23)
CO2: 26 mEq/L (ref 19–32)
Calcium: 9.9 mg/dL (ref 8.4–10.5)
Chloride: 103 mEq/L (ref 96–112)
Creatinine, Ser: 0.87 mg/dL (ref 0.40–1.50)
GFR: 104.09 mL/min (ref >60.00)
Glucose, Bld: 134 mg/dL — ABNORMAL HIGH (ref 70–99)
Potassium: 3.5 mEq/L (ref 3.5–5.1)
Sodium: 141 mEq/L (ref 135–145)
Total Bilirubin: 1.1 mg/dL (ref 0.2–1.2)
Total Protein: 8 g/dL (ref 6.0–8.3)

## 2021-06-18 LAB — HEMOGLOBIN A1C: Hgb A1c MFr Bld: 6.1 % (ref 4.6–6.5)

## 2021-06-22 ENCOUNTER — Other Ambulatory Visit (HOSPITAL_COMMUNITY): Payer: Self-pay

## 2021-06-22 ENCOUNTER — Encounter: Payer: Self-pay | Admitting: Internal Medicine

## 2021-06-22 ENCOUNTER — Ambulatory Visit: Payer: 59 | Admitting: Internal Medicine

## 2021-06-22 VITALS — BP 140/100 | HR 87 | Ht 69.0 in | Wt 197.2 lb

## 2021-06-22 DIAGNOSIS — E1169 Type 2 diabetes mellitus with other specified complication: Secondary | ICD-10-CM | POA: Diagnosis not present

## 2021-06-22 DIAGNOSIS — E6609 Other obesity due to excess calories: Secondary | ICD-10-CM | POA: Diagnosis not present

## 2021-06-22 DIAGNOSIS — Z6832 Body mass index (BMI) 32.0-32.9, adult: Secondary | ICD-10-CM

## 2021-06-22 DIAGNOSIS — E785 Hyperlipidemia, unspecified: Secondary | ICD-10-CM

## 2021-06-22 MED ORDER — ATORVASTATIN CALCIUM 80 MG PO TABS
80.0000 mg | ORAL_TABLET | Freq: Every day | ORAL | 3 refills | Status: DC
  Filled 2021-06-22: qty 90, 90d supply, fill #0
  Filled 2021-09-29 – 2021-10-12 (×2): qty 90, 90d supply, fill #1
  Filled 2022-01-12: qty 90, 90d supply, fill #2
  Filled 2022-04-06: qty 30, 30d supply, fill #3
  Filled 2022-04-27 – 2022-05-02 (×2): qty 30, 30d supply, fill #4
  Filled 2022-06-06: qty 30, 30d supply, fill #5

## 2021-06-22 NOTE — Progress Notes (Signed)
Patient ID: Peter Keller, male   DOB: 1975-05-06, 46 y.o.   MRN: [id number] ? ?This visit occurred during the SARS-CoV-2 public health emergency.  Safety protocols were in place, including screening questions prior to the visit, additional usage of staff PPE, and extensive cleaning of exam room while observing appropriate contact time as indicated for disinfecting solutions.  ? ?HPI: ?Peter Keller is a 46 y.o.-year-old male, returning for follow-up for DM2, dx in 04/2013 (A1c was 11%), non-insulin-dependent, uncontrolled, with long term complications(CAD - s/p drug-eluting stent).  Last visit 3 months ago. ? ?Interim history: ?No increased urination, blurry vision, nausea, chest pain. ?He continues to try to reduce alcohol. ? ?DM2: ?Reviewed HbA1c levels: ?Lab Results  ?Component Value Date  ? HGBA1C 6.1 06/18/2021  ? HGBA1C 6.7 (A) 03/17/2021  ? HGBA1C 9.3 (A) 01/14/2021  ? ?He is on: ?- Metformin 1000 mg 2x a day with meals - may forget the dose at night >> metformin ER 2000 >> 1500 mg in the a.m.  ?- Trulicity 1.5 mg >> 3 weekly >> Mounjaro 5 >> 7.5-10 mg weekly (depending on the availability at the pharmacy) ?- Jardiance 25 mg daily ?- Glipizide 5 mg before a larger dinner  -he seldom has to take this ?Patient was initially on Janumet extended-release, which was subsequently stopped.  ?Amaryl was added 07/2013, but then switched to Invokana 100 mg daily in 09/2013 because of weight gain. ?Trulicity [id number] mg weekly was added in 11/2013 for help with weight loss >> increased to 1.5 mg weekly in 01/2014 >> increased to 3 mg weekly and 01/2019. ?Previously on metformin extended-release - started  in 03/2014 ?Previously on [place].  Now on Jardiance per insurance preference ? ?He is checking his blood sugars more than 4 times a day with his Dexcom CGM: ? ? ?Prev.: ? ? ?Lowest sugar was 118 >> 137 >> 66 >> >70; it is unclear at which CBG level he has hypoglycemia awareness ?Highest sugar was 181 >> 266 >> 250  >> 220. ? ?Glucometer: One Probation officer IQ.  ? ?He was exercising regularly at the gym, not recently. ? ?-No CKD, last BUN/creatinine:  ?Lab Results  ?Component Value Date  ? BUN 15 06/18/2021  ? CREATININE 0.87 06/18/2021  ? ?-+ HL;  last set of lipids: ?Lab Results  ?Component Value Date  ? CHOL 147 06/18/2021  ? HDL 36.10 (L) 06/18/2021  ? [place] Comment (A) 01/12/2021  ? LDLDIRECT 94.0 06/18/2021  ? TRIG 211.0 (H) 06/18/2021  ? CHOLHDL 4 06/18/2021  ?On Lipitor 20 >> 40.  We restarted fenofibrate at 160 mg daily.  He was started on Vascepa 2 g twice a day, but this was causing diarrhea >> we stopped this. ? ?- last eye exam was in 02/2021: No DR reportedly. Dr. Wyatt Portela. ? ?-No numbness and tingling in his feet. ? ?She also has a history of HTN. When I saw him in 01/2017, he just went skiing and forgot BP meds: drank 2 cups of coffee >> HR 125-145 and BP when he returned 213/105.  He was started on blood pressure medication then. ?He also has a history of GERD. ? ?On vitamin D 2000 or 5000 units daily (unclear). ?Previous vitamin D levels were normal: ?Lab Results  ?Component Value Date  ? VD25OH 53.98 03/17/2021  ? VD25OH 33.55 12/10/2018  ? VD25OH 52 12/06/2013  ? ?ROS: ?+ see HPI ? ?I reviewed pt's medications, allergies, PMH, social hx, family hx, and changes were  documented in the history of present illness. Otherwise, unchanged from my initial visit note. ? ?Past Medical History:  ?Diagnosis Date  ? Alcohol dependence ([place])   ? Diabetes ([place])   ? GERD (gastroesophageal reflux disease)   ? Hepatic steatosis   ? Hyperlipidemia   ? Hypertension   ? Obesity   ? Sinus tachycardia   ? ?Past Surgical History:  ?Procedure Laterality Date  ? CORONARY STENT INTERVENTION N/A 07/24/2020  ? Procedure: CORONARY STENT INTERVENTION;  Surgeon: Burnell Blanks, MD;  Location: [facility];  Service: Cardiovascular;  Laterality: N/A;  ? INTRAVASCULAR ULTRASOUND/IVUS N/A 07/24/2020  ? Procedure: Intravascular  Ultrasound/IVUS;  Surgeon: Burnell Blanks, MD;  Location: [facility];  Service: Cardiovascular;  Laterality: N/A;  ? LEFT HEART CATH AND CORONARY ANGIOGRAPHY N/A 07/24/2020  ? Procedure: LEFT HEART CATH AND CORONARY ANGIOGRAPHY;  Surgeon: Burnell Blanks, MD;  Location: [facility];  Service: Cardiovascular;  Laterality: N/A;  ? none    ? ?History  ? ?Social History  ? Marital Status: Married  ?  Spouse Name: N/A  ? Number of Children: 2  ? ?Occupational History  ?  Advertising account executive   ? ?Social History Main Topics  ? Smoking status: Never Smoker   ? Smokeless tobacco: Never Used  ? Alcohol Use: Yes  ?   Comment: 3 alcoholic drinks daily  ? Drug Use: No  ? ?Current Outpatient Medications on File Prior to Visit  ?Medication Sig Dispense Refill  ? atorvastatin (LIPITOR) 40 MG tablet Take 1 tablet (40 mg total) by mouth daily. 90 tablet 3  ? Cholecalciferol (VITAMIN D) 400 UNITS capsule Take 500 Units by mouth daily.    ? clopidogrel (PLAVIX) 75 MG tablet Take 1 tablet (75 mg total) by mouth daily. 90 tablet 1  ? Continuous Blood Gluc Receiver (DEXCOM G6 RECEIVER) DEVI Use as instructed to check blood sugar 1 each 0  ? Continuous Blood Gluc Sensor (DEXCOM G6 SENSOR) MISC Use as instructed to check blood sugar change every 10 days 9 each 3  ? Continuous Blood Gluc Transmit (DEXCOM G6 TRANSMITTER) MISC Use as instructed to check blood sugar. Change every 90 days. 1 each 3  ? fenofibrate 160 MG tablet Take 1 tablet (160 mg total) by mouth daily. 90 tablet 3  ? JARDIANCE 25 MG TABS tablet Take 1 tablet (25 mg total) by mouth daily. 90 tablet 3  ? losartan-hydrochlorothiazide (HYZAAR) 100-25 MG tablet Take 1 tablet by mouth daily. 90 tablet 3  ? metFORMIN (GLUCOPHAGE-XR) 500 MG 24 hr tablet Take 4 tablets (2,000 mg total) by mouth daily with breakfast. 360 tablet 3  ? nebivolol (BYSTOLIC) 10 MG tablet Take 1 tablet (10 mg total) by mouth 2 (two) times daily. 180 tablet 3  ? nitroGLYCERIN  (NITROSTAT) 0.4 MG SL tablet Place 1 tablet under the tongue every 5 minutes for 3 doses as needed for chest pain 25 tablet 3  ? pantoprazole (PROTONIX) 40 MG tablet Take 1 tablet (40 mg total) by mouth 2 (two) times daily. 180 tablet 3  ? predniSONE (DELTASONE) 50 MG tablet Take 1 tablet (50 mg total) by mouth daily with breakfast. 5 tablet 0  ? tirzepatide (MOUNJARO) 10 MG/0.5ML Pen Inject 10 mg into the skin once a week. 6 mL 3  ? ?No current facility-administered medications on file prior to visit.  ? ?No Known Allergies ?Family History  ?Problem Relation Age of Onset  ? Hypertension Mother   ?  Diabetes Mother   ? Heart disease Paternal Uncle   ? Heart attack Paternal Grandmother   ? Colon cancer Neg Hx   ? Esophageal cancer Neg Hx   ? Stomach cancer Neg Hx   ? ?PE: ?BP (!) 140/100 (BP Location: Right Arm, Patient Position: Sitting, Cuff Size: Normal)   Pulse 87   Ht 5\' 9"  (1.753 m)   Wt 197 lb 3.2 oz (89.4 kg)   SpO2 97%   BMI 29.12 kg/m?  ?Wt Readings from Last 3 Encounters:  ?06/22/21 197 lb 3.2 oz (89.4 kg)  ?04/29/21 197 lb 12.8 oz (89.7 kg)  ?03/17/21 201 lb 9.6 oz (91.4 kg)  ? ?Constitutional: overweight, in NAD ?Eyes: PERRLA, EOMI, no exophthalmos ?ENT: moist mucous membranes, no thyromegaly, no cervical lymphadenopathy ?Cardiovascular: RRR, No MRG ?Respiratory: CTA B ?Musculoskeletal: no deformities, strength intact in all 4 ?Skin: moist, warm, no rashes ?Neurological: no tremor with outstretched hands, DTR normal in all 4 ? ?ASSESSMENT: ?1. DM2, non-insulin-dependent, uncontrolled, with complications: ?- CAD, s/p drug-eluting stent 07/2020 ? ?2. Hypertriglyceridemia ? ?3.  Obesity class I ? ?- Right upper quadrant ultrasound from 08/07/2013 shows probable fatty infiltration, confirmed on the abdominal MRI 08/13/2013 ?- AST> ALT, consistent with excessive alcohol use.  He stopped drinking alcohol several times in the past, but restarted afterwards. ? ?PLAN:  ?1. Patient with history of uncontrolled  type 2 diabetes, improved in the last few months after adjusting his diabetic regimen.  His HbA1c was 9.3% when he returned after an absence of 2 years.  He was on metformin, Jardiance, and Trulicity.  He was

## 2021-06-22 NOTE — Patient Instructions (Addendum)
Please continue: ?- Metformin ER 1500 mg in a.m. ?- Jardiance 25 mg daily in am ?- Glipizide 5 mg 15-30 min before a larger meal ?- Mounjaro 7.5-10 mg weekly ? ?Please increase Lipitor to 80 mg daily. ? ?Please return in 3-4 months. ?

## 2021-07-14 ENCOUNTER — Other Ambulatory Visit (HOSPITAL_COMMUNITY): Payer: Self-pay

## 2021-07-16 ENCOUNTER — Other Ambulatory Visit: Payer: 59

## 2021-08-13 ENCOUNTER — Other Ambulatory Visit: Payer: 59

## 2021-08-13 DIAGNOSIS — Z79899 Other long term (current) drug therapy: Secondary | ICD-10-CM

## 2021-08-13 DIAGNOSIS — E1169 Type 2 diabetes mellitus with other specified complication: Secondary | ICD-10-CM

## 2021-08-14 LAB — HEPATIC FUNCTION PANEL
ALT: 17 IU/L (ref 0–44)
AST: 28 IU/L (ref 0–40)
Albumin: 4.6 g/dL (ref 4.0–5.0)
Alkaline Phosphatase: 51 IU/L (ref 44–121)
Bilirubin Total: 1 mg/dL (ref 0.0–1.2)
Bilirubin, Direct: 0.34 mg/dL (ref 0.00–0.40)
Total Protein: 7.8 g/dL (ref 6.0–8.5)

## 2021-08-14 LAB — LIPID PANEL
Chol/HDL Ratio: 4.9 ratio (ref 0.0–5.0)
Cholesterol, Total: 167 mg/dL (ref 100–199)
HDL: 34 mg/dL — ABNORMAL LOW (ref >39)
LDL Chol Calc (NIH): 101 mg/dL — ABNORMAL HIGH (ref 0–99)
Triglycerides: 181 mg/dL — ABNORMAL HIGH (ref 0–149)
VLDL Cholesterol Cal: 32 mg/dL (ref 5–40)

## 2021-08-18 ENCOUNTER — Encounter: Payer: Self-pay | Admitting: Cardiovascular Disease

## 2021-08-18 NOTE — Progress Notes (Unsigned)
Cardiology Office Note:    Date:  08/19/2021   ID:  Kenn File, DOB 1975/03/06, MRN [id number]  PCP:  Myrlene Broker, MD   [facility] Providers Cardiologist:  Kristeen Miss, MD     Referring MD: Myrlene Broker, *   Chief Complaint  Patient presents with   Coronary Artery Disease         Oct. 24, 2022   Peter Keller is a 46 y.o. male with a hx of CAD , s/p stenting EF is normal - 60-65%.  Peter Keller is seen today for follow up of his coronary artery disease and coronary stenting  VS look good  Is exercising 3-4 times a month .  Does elliptical for 20 min, then on the bike for 10 min. Had some chest pain on Oct. 1. Slight ,  Persistent CP ,  took a SL NTG  He thinks it may have been GERD .   Was on nexium previously  Wants to double his protonix .   Trigs were 434 - Vascepa was started at that time  Occasionally forgets his nighttime Vascepa.    We discussed increasing exercise  Diet has improved , less pizza ,  still eats bread  EF form June, 2022 shows EF 60-65%   Having some muscle aches,  we discussed having him hold his Atorva and see if his muscle aches improve We will send him to the lipid clinic if the lipids are not improved or if he is not tolerating the atorvastatin.  April 29, 2021  Hx of prox LAD stenting  Has moderate CAD elsewhere  Doing well Has only tried the NTG once - and he thinks its likely GERD The ASA is causing lots of GERD  We changed from Brilinta to Plavix - brilinta caused lots of bleeding with scratches and such  Is having lots of bloody nose issues   Rec. Appt with ENT Will DC ASA and cont plavix   Still having lots of neck pain - due to a partially ruptured disc.   Is on atorvastatin and fenofibrate 160 mg a day   We discussed the benefits of weight loss  Wt today is 197 lbs   HR is 100 today - on Bystolic 10 QD Will increase bystolic to 10 BID Admits to drinking too much - which may be the cause of his  resting tachycardia   August 19, 2021 Peter Keller is seen today for follow up visit Hx of prox LAD stenting Moderate CAD elsewhere LDL in May 2023 was 44  Wants to stop his Plavix ( ran out a week or so ago )   Also the protonix does not seem to work as well as nexium He has lots of GERD  We tried Brilinta initially but it caused a significant amount of bleeding ( nosebleeds )   He wants to try alternating ASA with plavix every 2 week. ( I dont think this is a good idea and I informed him of this )   Has lost 20-25 lbs 9 with Mounjaro   LDL is 94 Trigs are markedly  improved.  Is on atorva 80, fenofibrate 160 ,   His LDL goal is 50-70 I suggested that he go to our lipid clinic to get started on a PCSK9 inhibitor or Inclisiran He wants to discuss with his endocrinologist    Past Medical History:  Diagnosis Date   Alcohol dependence (HCC)    Diabetes (HCC)    GERD (gastroesophageal reflux  disease)    Hepatic steatosis    Hyperlipidemia    Hypertension    Obesity    Sinus tachycardia     Past Surgical History:  Procedure Laterality Date   CORONARY STENT INTERVENTION N/A 07/24/2020   Procedure: CORONARY STENT INTERVENTION;  Surgeon: Kathleene Hazel, MD;  Location: [facility];  Service: Cardiovascular;  Laterality: N/A;   INTRAVASCULAR ULTRASOUND/IVUS N/A 07/24/2020   Procedure: Intravascular Ultrasound/IVUS;  Surgeon: Kathleene Hazel, MD;  Location: [facility];  Service: Cardiovascular;  Laterality: N/A;   LEFT HEART CATH AND CORONARY ANGIOGRAPHY N/A 07/24/2020   Procedure: LEFT HEART CATH AND CORONARY ANGIOGRAPHY;  Surgeon: Kathleene Hazel, MD;  Location: [facility];  Service: Cardiovascular;  Laterality: N/A;   none      Current Medications: Current Meds  Medication Sig   atorvastatin (LIPITOR) 80 MG tablet Take 1 tablet (80 mg total) by mouth daily.   Cholecalciferol (VITAMIN D) 400 UNITS capsule Take 500 Units by mouth daily.    Continuous Blood Gluc Receiver (DEXCOM G6 RECEIVER) DEVI Use as instructed to check blood sugar   Continuous Blood Gluc Sensor (DEXCOM G6 SENSOR) MISC Use as instructed to check blood sugar change every 10 days   Continuous Blood Gluc Transmit (DEXCOM G6 TRANSMITTER) MISC Use as instructed to check blood sugar. Change every 90 days.   fenofibrate 160 MG tablet Take 1 tablet (160 mg total) by mouth daily.   JARDIANCE 25 MG TABS tablet Take 1 tablet (25 mg total) by mouth daily.   losartan-hydrochlorothiazide (HYZAAR) 100-25 MG tablet Take 1 tablet by mouth daily.   metFORMIN (GLUCOPHAGE-XR) 500 MG 24 hr tablet Take 4 tablets (2,000 mg total) by mouth daily with breakfast.   metoprolol succinate (TOPROL-XL) 100 MG 24 hr tablet Take 1 tablet (100 mg total) by mouth daily. Take with or immediately following a meal.   nitroGLYCERIN (NITROSTAT) 0.4 MG SL tablet Place 1 tablet under the tongue every 5 minutes for 3 doses as needed for chest pain   pantoprazole (PROTONIX) 40 MG tablet Take 1 tablet (40 mg total) by mouth 2 (two) times daily.   tirzepatide (MOUNJARO) 10 MG/0.5ML Pen Inject 10 mg into the skin once a week.   [DISCONTINUED] clopidogrel (PLAVIX) 75 MG tablet Take 1 tablet (75 mg total) by mouth daily.   [DISCONTINUED] nebivolol (BYSTOLIC) 10 MG tablet Take 1 tablet (10 mg total) by mouth 2 (two) times daily.     Allergies:   Patient has no known allergies.   Social History   Socioeconomic History   Marital status: Married    Spouse name: Not on file   Number of children: Not on file   Years of education: Not on file   Highest education level: Not on file  Occupational History   Not on file  Tobacco Use   Smoking status: Never   Smokeless tobacco: Never  Substance and Sexual Activity   Alcohol use: Yes    Comment: 3 alcoholic drinks daily   Drug use: No   Sexual activity: Not on file  Other Topics Concern   Not on file  Social History Narrative   Not on file   Social  Determinants of Health   Financial Resource Strain: Not on file  Food Insecurity: Not on file  Transportation Needs: Not on file  Physical Activity: Not on file  Stress: Not on file  Social Connections: Not on file     Family History: The  patient's family history includes Diabetes in his mother; Heart attack in his paternal grandmother; Heart disease in his paternal uncle; Hypertension in his mother. There is no history of Colon cancer, Esophageal cancer, or Stomach cancer.  ROS:   Please see the history of present illness.     All other systems reviewed and are negative.  EKGs/Labs/Other Studies Reviewed:    The following studies were reviewed today:   EKG:    August 19, 2021   ,  sinus tachycardia 105 .     Recent Labs: 01/12/2021: Hemoglobin 16.6; Platelets 163 06/18/2021: BUN 15; Creatinine, Ser 0.87; Potassium 3.5; Sodium 141 08/13/2021: ALT 17  Recent Lipid Panel    Component Value Date/Time   CHOL 167 08/13/2021 1034   TRIG 181 (H) 08/13/2021 1034   HDL 34 (L) 08/13/2021 1034   CHOLHDL 4.9 08/13/2021 1034   CHOLHDL 4 06/18/2021 1049   VLDL 42.2 (H) 06/18/2021 1049   LDLCALC 101 (H) 08/13/2021 1034   LDLDIRECT 94.0 06/18/2021 1049     Risk Assessment/Calculations:           Physical Exam:    Physical Exam: Blood pressure 118/88, pulse (!) 105, height 5\' 9"  (1.753 m), weight 191 lb 6.4 oz (86.8 kg), SpO2 99 %.  GEN:  Well nourished, well developed in no acute distress HEENT: Normal NECK: No JVD; No carotid bruits LYMPHATICS: No lymphadenopathy CARDIAC: RRR tachycardic RESPIRATORY:  Clear to auscultation without rales, wheezing or rhonchi  ABDOMEN: Soft, non-tender, non-distended MUSCULOSKELETAL:  No edema; No deformity  SKIN: Warm and dry NEUROLOGIC:  Alert and oriented x 3   ASSESSMENT:    1. Essential hypertension, benign   2. Coronary artery disease involving native coronary artery of native heart without angina pectoris   3. Hyperlipidemia  associated with type 2 diabetes mellitus (HCC)      PLAN:      1.  Coronary artery disease:    seems to be doing well.  He has not had any episodes of chest pain.  He wants to stop the Plavix because he is not able to take his Nexium.  His suggestion was that he alternate aspirin with Plavix every 2 weeks but told him I do not think this is a good idea.  I do not think this will allow him to take Nexium on alternate weeks because of the issue that it would create with the Plavix. I suggested low-dose Brilinta but he had lots of bleeding issues with Brilinta just after his stent.  After much discussion we agreed that he should get back on the plavix. And then I think he agreed to continue taking Plavix 75 mg a day.  He will deal with his reflux with other medications.  2.  Hyperlipidemia:   His triglycerides are significantly better than they were last year.  His LDL remains between 90 and 100.  I have suggested that we refer him to our lipid clinic to consider Praluent, Repatha, or inclisiran.  He would like to have his endocrinologist manage this.  I have suggested that he ask his endocrinologist about the above 3 medications as I think this will help him quite a bit.  3.  Sinus tachycardia: His heart rate is relatively fast.  We will stop Bystolic and try metoprolol XL 100 mg a day.  He might need a higher dose.  I have asked her to record his heart rate and blood pressure and send Peter Keller some readings in a couple weeks.  We will see him in 3 months for follow-up visit and to make adjustments to the above-noted medication changes.      Medication Adjustments/Labs and Tests Ordered: Current medicines are reviewed at length with the patient today.  Concerns regarding medicines are outlined above.  Orders Placed This Encounter  Procedures   EKG 12-Lead   Meds ordered this encounter  Medications   metoprolol succinate (TOPROL-XL) 100 MG 24 hr tablet    Sig: Take 1 tablet (100 mg total)  by mouth daily. Take with or immediately following a meal.    Dispense:  90 tablet    Refill:  3   clopidogrel (PLAVIX) 75 MG tablet    Sig: Take 1 tablet (75 mg total) by mouth daily.    Dispense:  90 tablet    Refill:  1    D/c Brilinta     Patient Instructions  I think that one of the following medications would be advantageous for you  Praluent Repatha Inclisiran  Please ask your endocrinologist to consider 1 of these medications  in addition to atorvastatin.  Medication Instructions:  Stop Bystolic Start Metoprolol XL 100 mg daily. *If you need a refill on your cardiac medications before your next appointment, please call your pharmacy*   Follow-Up: At [facility], you and your health needs are our priority.  As part of our continuing mission to provide you with exceptional heart care, we have created designated Provider Care Teams.  These Care Teams include your primary Cardiologist (physician) and Advanced Practice Providers (APPs -  Physician Assistants and Nurse Practitioners) who all work together to provide you with the care you need, when you need it.   Your next appointment:   3 month(s)  The format for your next appointment:   In Person  Provider:   Dr. Elease Hashimoto or APP   Important Information About Sugar          Signed, Kristeen Miss, MD  08/19/2021 6:48 PM    [facility]

## 2021-08-19 ENCOUNTER — Encounter: Payer: Self-pay | Admitting: Cardiovascular Disease

## 2021-08-19 ENCOUNTER — Ambulatory Visit: Payer: 59 | Admitting: Cardiovascular Disease

## 2021-08-19 ENCOUNTER — Other Ambulatory Visit (HOSPITAL_COMMUNITY): Payer: Self-pay

## 2021-08-19 VITALS — BP 118/88 | HR 105 | Ht 69.0 in | Wt 191.4 lb

## 2021-08-19 DIAGNOSIS — I2510 Atherosclerotic heart disease of native coronary artery without angina pectoris: Secondary | ICD-10-CM

## 2021-08-19 DIAGNOSIS — E1169 Type 2 diabetes mellitus with other specified complication: Secondary | ICD-10-CM | POA: Diagnosis not present

## 2021-08-19 DIAGNOSIS — E785 Hyperlipidemia, unspecified: Secondary | ICD-10-CM | POA: Diagnosis not present

## 2021-08-19 DIAGNOSIS — I10 Essential (primary) hypertension: Secondary | ICD-10-CM | POA: Diagnosis not present

## 2021-08-19 MED ORDER — METOPROLOL SUCCINATE ER 100 MG PO TB24
100.0000 mg | ORAL_TABLET | Freq: Every day | ORAL | 3 refills | Status: DC
  Filled 2021-08-19: qty 90, 90d supply, fill #0
  Filled 2021-11-09: qty 90, 90d supply, fill #1

## 2021-08-19 MED ORDER — CLOPIDOGREL BISULFATE 75 MG PO TABS
75.0000 mg | ORAL_TABLET | Freq: Every day | ORAL | 1 refills | Status: DC
  Filled 2021-08-19: qty 90, 90d supply, fill #0
  Filled 2021-11-23: qty 90, 90d supply, fill #1

## 2021-08-19 NOTE — Patient Instructions (Addendum)
I think that one of the following medications would be advantageous for you  Praluent Repatha Inclisiran  Please ask your endocrinologist to consider 1 of these medications  in addition to atorvastatin.  Medication Instructions:  Stop Bystolic Start Metoprolol XL 100 mg daily. *If you need a refill on your cardiac medications before your next appointment, please call your pharmacy*   Follow-Up: At [facility], you and your health needs are our priority.  As part of our continuing mission to provide you with exceptional heart care, we have created designated Provider Care Teams.  These Care Teams include your primary Cardiologist (physician) and Advanced Practice Providers (APPs -  Physician Assistants and Nurse Practitioners) who all work together to provide you with the care you need, when you need it.   Your next appointment:   3 month(s)  The format for your next appointment:   In Person  Provider:   Dr. Elease Hashimoto or APP   Important Information About Sugar

## 2021-08-25 ENCOUNTER — Other Ambulatory Visit: Payer: Self-pay | Admitting: Cardiovascular Disease

## 2021-08-25 ENCOUNTER — Other Ambulatory Visit (HOSPITAL_COMMUNITY): Payer: Self-pay

## 2021-08-25 ENCOUNTER — Other Ambulatory Visit: Payer: Self-pay

## 2021-08-25 ENCOUNTER — Other Ambulatory Visit: Payer: Self-pay | Admitting: Internal Medicine

## 2021-08-26 ENCOUNTER — Other Ambulatory Visit (HOSPITAL_COMMUNITY): Payer: Self-pay

## 2021-08-26 MED ORDER — PANTOPRAZOLE SODIUM 40 MG PO TBEC
40.0000 mg | DELAYED_RELEASE_TABLET | Freq: Two times a day (BID) | ORAL | 3 refills | Status: DC
  Filled 2021-08-26: qty 180, 90d supply, fill #0
  Filled 2021-11-23: qty 180, 90d supply, fill #1
  Filled 2022-02-19: qty 60, 30d supply, fill #2
  Filled 2022-03-22: qty 60, 30d supply, fill #3
  Filled 2022-04-27: qty 60, 30d supply, fill #4
  Filled 2022-05-26: qty 60, 30d supply, fill #5
  Filled 2022-06-30: qty 60, 30d supply, fill #6
  Filled 2022-07-27: qty 60, 30d supply, fill #7

## 2021-08-26 NOTE — Telephone Encounter (Signed)
Per OV note on 08/19/21: 1.  Coronary artery disease:    [name] seems to be doing well.  He has not had any episodes of chest pain.  He wants to stop the Plavix because he is not able to take his Nexium.  His suggestion was that he alternate aspirin with Plavix every 2 weeks but told him I do not think this is a good idea.  I do not think this will allow him to take Nexium on alternate weeks because of the issue that it would create with the Plavix. I suggested low-dose Brilinta but he had lots of bleeding issues with Brilinta just after his stent.   After much discussion we agreed that he should get back on the plavix. And then I think he agreed to continue taking Plavix 75 mg a day.  He will deal with his reflux with other medications.   Pt due to return in 3 month (October 2023), but will send approval for 1 year since he is up to date on his appt's.

## 2021-09-03 ENCOUNTER — Other Ambulatory Visit (HOSPITAL_COMMUNITY): Payer: Self-pay

## 2021-09-03 ENCOUNTER — Other Ambulatory Visit: Payer: Self-pay | Admitting: Medical

## 2021-09-03 MED ORDER — FENOFIBRATE 160 MG PO TABS
160.0000 mg | ORAL_TABLET | Freq: Every day | ORAL | 3 refills | Status: DC
  Filled 2021-09-03: qty 90, 90d supply, fill #0
  Filled 2021-11-23: qty 90, 90d supply, fill #1
  Filled 2022-03-02: qty 30, 30d supply, fill #2
  Filled 2022-04-06: qty 30, 30d supply, fill #3
  Filled 2022-04-27 – 2022-05-02 (×2): qty 30, 30d supply, fill #4
  Filled 2022-06-02: qty 30, 30d supply, fill #5
  Filled 2022-06-30: qty 30, 30d supply, fill #6
  Filled 2022-07-27: qty 30, 30d supply, fill #7

## 2021-09-29 ENCOUNTER — Other Ambulatory Visit (HOSPITAL_COMMUNITY): Payer: Self-pay

## 2021-10-07 ENCOUNTER — Other Ambulatory Visit (HOSPITAL_COMMUNITY): Payer: Self-pay

## 2021-10-12 ENCOUNTER — Other Ambulatory Visit (HOSPITAL_COMMUNITY): Payer: Self-pay

## 2021-10-20 ENCOUNTER — Other Ambulatory Visit (HOSPITAL_COMMUNITY): Payer: Self-pay

## 2021-10-20 ENCOUNTER — Encounter: Payer: Self-pay | Admitting: Internal Medicine

## 2021-10-20 ENCOUNTER — Ambulatory Visit: Payer: 59 | Admitting: Internal Medicine

## 2021-10-20 VITALS — BP 130/80 | HR 97 | Ht 69.0 in | Wt 194.6 lb

## 2021-10-20 DIAGNOSIS — E785 Hyperlipidemia, unspecified: Secondary | ICD-10-CM | POA: Diagnosis not present

## 2021-10-20 DIAGNOSIS — E1169 Type 2 diabetes mellitus with other specified complication: Secondary | ICD-10-CM

## 2021-10-20 DIAGNOSIS — Z6832 Body mass index (BMI) 32.0-32.9, adult: Secondary | ICD-10-CM

## 2021-10-20 DIAGNOSIS — E6609 Other obesity due to excess calories: Secondary | ICD-10-CM | POA: Diagnosis not present

## 2021-10-20 LAB — POCT GLYCOSYLATED HEMOGLOBIN (HGB A1C): Hemoglobin A1C: 6.3 % — AB (ref 4.0–5.6)

## 2021-10-20 MED ORDER — DEXCOM G7 RECEIVER DEVI
1.0000 | Freq: Once | 0 refills | Status: AC
  Filled 2021-10-20: qty 1, 90d supply, fill #0

## 2021-10-20 MED ORDER — DEXCOM G7 SENSOR MISC
3.0000 | 4 refills | Status: DC
  Filled 2021-10-20: qty 9, 90d supply, fill #0
  Filled 2021-10-22: qty 3, 30d supply, fill #0

## 2021-10-20 NOTE — Progress Notes (Signed)
Patient ID: Peter Keller, male   DOB: [birth year], 46 y.o.   MRN: [id number]  HPI: Teller Wakefield is a 46 y.o.-year-old male, returning for follow-up for DM2, dx in 04/2013 (A1c was 11%), non-insulin-dependent, uncontrolled, with long term complications(CAD - s/p drug-eluting stent).  Last visit 4 months ago.  Interim history: No increased urination, blurry vision, nausea, chest pain. He went on a [place] cruise this summer.  Sugars did not increase during the trip.  He was very active.  DM2: Reviewed HbA1c levels: Lab Results  Component Value Date   HGBA1C 6.1 06/18/2021   HGBA1C 6.7 (A) 03/17/2021   HGBA1C 9.3 (A) 01/14/2021   He is on: - Metformin 1000 mg 2x a day with meals - may forget the dose at night >> metformin ER 2000 >> 1500 mg in the a.m.  - Mounjaro 5 >> 10 mg weekly (depending on the availability at the pharmacy) - Jardiance 25 mg daily - Glipizide 5 mg before a larger dinner  -he seldom has to take this Patient was initially on Janumet extended-release, which was subsequently stopped.  Amaryl was added 07/2013, but then switched to Invokana 100 mg daily in 09/2013 because of weight gain. Trulicity 0.75 mg weekly was added in 11/2013 for help with weight loss >> increased to 1.5 mg weekly in 01/2014 >> increased to 3 mg weekly and 01/2019. Previously on metformin extended-release - started  in 03/2014 Previously on [place].  Now on Jardiance per insurance preference  He is checking his blood sugars 0-1x a day -he is off the Dexcom sensor for now: - am: 140-150 - 2h after b'fast: n/c - lunch: n/c - 2h after lunch: 160 - dinner: n/c - 2h after dinner: 160 - bedtime: n/c  Previously:   Lowest sugar was 118 >> 137 >> 66 >> >70; it is unclear at which CBG level he has hypoglycemia awareness Highest sugar was 181 >> 266 >> 250 >> 220.  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   He was exercising regularly at the gym, not recently.  -No CKD, last  BUN/creatinine:  Lab Results  Component Value Date   BUN 15 06/18/2021   CREATININE 0.87 06/18/2021   -+ HL;  last set of lipids: Lab Results  Component Value Date   CHOL 167 08/13/2021   HDL 34 (L) 08/13/2021   LDLCALC 101 (H) 08/13/2021   LDLDIRECT 94.0 06/18/2021   TRIG 181 (H) 08/13/2021   CHOLHDL 4.9 08/13/2021  On Lipitor 20 >> 40 >> 80.  We restarted fenofibrate at 160 mg daily.  He was started on Vascepa 2 g twice a day, but this was causing diarrhea >> we stopped this.  He recently saw cardiology and a PCSK9 inhibitor was suggested.  He did not start yet.  - last eye exam was in 02/2021: No DR reportedly. Dr. Fabian Sharp.  -No numbness and tingling in his feet.  She also has a history of HTN. When I saw him in 01/2017, he just went skiing and forgot BP meds: drank 2 cups of coffee >> HR 125-145 and BP when he returned 213/105.  He was started on blood pressure medication then. He also has a history of GERD.  ROS: + see HPI  I reviewed pt's medications, allergies, PMH, social hx, family hx, and changes were documented in the history of present illness. Otherwise, unchanged from my initial visit note.  Past Medical History:  Diagnosis Date   Alcohol dependence (HCC)    Diabetes (  HCC)    GERD (gastroesophageal reflux disease)    Hepatic steatosis    Hyperlipidemia    Hypertension    Obesity    Sinus tachycardia    Past Surgical History:  Procedure Laterality Date   CORONARY STENT INTERVENTION N/A 07/24/2020   Procedure: CORONARY STENT INTERVENTION;  Surgeon: Kathleene Hazel, MD;  [facility]: [facility];  Service: Cardiovascular;  Laterality: N/A;   INTRAVASCULAR ULTRASOUND/IVUS N/A 07/24/2020   Procedure: Intravascular Ultrasound/IVUS;  Surgeon: Kathleene Hazel, MD;  [facility]: [facility];  Service: Cardiovascular;  Laterality: N/A;   LEFT HEART CATH AND CORONARY ANGIOGRAPHY N/A 07/24/2020   Procedure: LEFT HEART CATH AND CORONARY  ANGIOGRAPHY;  Surgeon: Kathleene Hazel, MD;  Location: [facility];  Service: Cardiovascular;  Laterality: N/A;   none     History   Social History   Marital Status: Married    Spouse Name: N/A   Number of Children: 2   Occupational History    Nurse, adult    Social History Main Topics   Smoking status: Never Smoker    Smokeless tobacco: Never Used   Alcohol Use: Yes     Comment: 3 alcoholic drinks daily   Drug Use: No   Current Outpatient Medications on File Prior to Visit  Medication Sig Dispense Refill   atorvastatin (LIPITOR) 80 MG tablet Take 1 tablet (80 mg total) by mouth daily. 90 tablet 3   Cholecalciferol (VITAMIN D) 400 UNITS capsule Take 500 Units by mouth daily.     clopidogrel (PLAVIX) 75 MG tablet Take 1 tablet (75 mg total) by mouth daily. 90 tablet 1   Continuous Blood Gluc Receiver (DEXCOM G6 RECEIVER) DEVI Use as instructed to check blood sugar 1 each 0   Continuous Blood Gluc Sensor (DEXCOM G6 SENSOR) MISC Use as instructed to check blood sugar change every 10 days 9 each 3   Continuous Blood Gluc Transmit (DEXCOM G6 TRANSMITTER) MISC Use as instructed to check blood sugar. Change every 90 days. 1 each 3   fenofibrate 160 MG tablet Take 1 tablet (160 mg total) by mouth daily. 90 tablet 3   JARDIANCE 25 MG TABS tablet Take 1 tablet (25 mg total) by mouth daily. 90 tablet 3   losartan-hydrochlorothiazide (HYZAAR) 100-25 MG tablet Take 1 tablet by mouth daily. 90 tablet 3   metFORMIN (GLUCOPHAGE-XR) 500 MG 24 hr tablet Take 4 tablets (2,000 mg total) by mouth daily with breakfast. 360 tablet 3   metoprolol succinate (TOPROL-XL) 100 MG 24 hr tablet Take 1 tablet (100 mg total) by mouth daily. Take with or immediately following a meal. 90 tablet 3   nitroGLYCERIN (NITROSTAT) 0.4 MG SL tablet Place 1 tablet under the tongue every 5 minutes for 3 doses as needed for chest pain 25 tablet 3   pantoprazole (PROTONIX) 40 MG tablet Take 1 tablet (40 mg  total) by mouth 2 (two) times daily. 180 tablet 3   tirzepatide (MOUNJARO) 10 MG/0.5ML Pen Inject 10 mg into the skin once a week. 6 mL 3   No current facility-administered medications on file prior to visit.   No Known Allergies Family History  Problem Relation Age of Onset   Hypertension Mother    Diabetes Mother    Heart disease Paternal Uncle    Heart attack Paternal Grandmother    Colon cancer Neg Hx    Esophageal cancer Neg Hx    Stomach cancer Neg Hx    PE: BP  130/80 (BP Location: Right Arm, Patient Position: Sitting, Cuff Size: Normal)   Pulse 97   Ht 5\' 9"  (1.753 m)   Wt 194 lb 9.6 oz (88.3 kg)   SpO2 97%   BMI 28.74 kg/m  Wt Readings from Last 3 Encounters:  10/20/21 194 lb 9.6 oz (88.3 kg)  08/19/21 191 lb 6.4 oz (86.8 kg)  06/22/21 197 lb 3.2 oz (89.4 kg)   Constitutional: overweight, in NAD Eyes: no exophthalmos ENT: moist mucous membranes, no masses palpated in neck, no cervical lymphadenopathy Cardiovascular: RRR, No MRG Respiratory: CTA B Musculoskeletal: no deformities Skin: moist, warm, no rashes Neurological: no tremor with outstretched hands Diabetic Foot Exam - Simple   Simple Foot Form Diabetic Foot exam was performed with the following findings: Yes 10/20/2021 10:48 AM  Visual Inspection No deformities, no ulcerations, no other skin breakdown bilaterally: Yes Sensation Testing Intact to touch and monofilament testing bilaterally: Yes Pulse Check Posterior Tibialis and Dorsalis pulse intact bilaterally: Yes Comments    ASSESSMENT: 1. DM2, non-insulin-dependent, uncontrolled, with complications: - CAD, s/p drug-eluting stent 07/2020  2. Hypertriglyceridemia  3.  Obesity class I  - Right upper quadrant ultrasound from 08/07/2013 shows probable fatty infiltration, confirmed on the abdominal MRI 08/13/2013 - AST> ALT, consistent with excessive alcohol use.  He stopped drinking alcohol several times in the past, but restarted  afterwards.  PLAN:  1. Patient with history of uncontrolled type 2 diabetes, improved this year, after adjusting his diabetic regimen and improving diet.  His HbA1c was 9.3% when he returns after an absence of 2 years.  At low visit, HbA1c decreased to 6.1%.  He is currently on metformin ER (had diarrhea with regular metformin), sulfonylurea with larger meals, SGLT2 inhibitor, and GLP-1/GIP receptor agonist (change from GLP-1 receptor agonist for more potent effect).  He tolerates the above regimen well.  At last visit, we did not change the regimen as his blood sugars were fluctuating mostly within the target range. -At today's visit, he is not on the Dexcom CGM for the last 2-3 months.  He is interested in switching from the G6 to G7 sensor.  I sent a prescription for this to his pharmacy. -Per his recall, sugars in the morning are slightly above target and they are at goal later in the day.  I believe that starting back on the sensor will help with his blood sugar control.  We also discussed about restarting going to the gym and staying active.  He is working with a 08/15/2013 earlier in the summer and feels that this helped a lot. -As of now, I would not suggest changing his medication regimen - I suggested to:  Patient Instructions  Please continue: - Metformin ER 1500 mg in a.m. - Jardiance 25 mg daily in am - Glipizide 5 mg 15-30 min before a larger meal - Mounjaro 7.5-10 mg weekly  Please return in 3-4 months.  - we checked his HbA1c: 6.3% (higher, but still excellent) - advised to check sugars at different times of the day - 4x a day, rotating check times - advised for yearly eye exams >> he is UTD - return to clinic in 3-4 months  2. Hypertriglyceridemia -Reviewed latest lipid panel from 07/2021: LDL still above target of less than 55 due to history of cardiovascular disease, triglycerides high, HDL low: Lab Results  Component Value Date   CHOL 167 08/13/2021   HDL 34 (L)  08/13/2021   LDLCALC 101 (H) 08/13/2021   LDLDIRECT  94.0 06/18/2021   TRIG 181 (H) 08/13/2021   CHOLHDL 4.9 08/13/2021  -He is on Lipitor 80 mg daily, Tricor 160 mg daily.  Tried Vascepa but this caused diarrhea. -He recently saw Dr. Elease Hashimoto, who suggested a PCSK9 inhibitor.  He wanted to check with me if this is a good option for him.  At today's visit, we discussed about the benefits of this class of medications and I strongly advised him to start it. -He continues to work on reducing alcohol and improving diet  3.  Obesity class I -continue SGLT 2 inhibitor and GLP-1/GIP receptor agonist which should also help with weight loss -She lost 13 pounds before the last 3 visits combined -At today's visit, he lost 3 more pounds  Carlus Pavlov, MD PhD [facility] End[facility]

## 2021-10-20 NOTE — Patient Instructions (Signed)
Please continue: - Metformin ER 1500 mg in a.m. - Jardiance 25 mg daily in am - Glipizide 5 mg 15-30 min before a larger meal - Mounjaro 7.5-10 mg weekly  Please return in 3-4 months.

## 2021-10-21 ENCOUNTER — Telehealth: Payer: Self-pay

## 2021-10-21 ENCOUNTER — Other Ambulatory Visit (HOSPITAL_COMMUNITY): Payer: Self-pay

## 2021-10-21 DIAGNOSIS — E1169 Type 2 diabetes mellitus with other specified complication: Secondary | ICD-10-CM

## 2021-10-21 MED ORDER — FREESTYLE LIBRE 3 SENSOR MISC
1.0000 | 3 refills | Status: DC
  Filled 2021-10-21 – 2021-10-22 (×2): qty 6, 84d supply, fill #0
  Filled 2022-01-12: qty 6, 84d supply, fill #1

## 2021-10-21 MED ORDER — FREESTYLE LIBRE READER DEVI
0 refills | Status: DC
  Filled 2021-10-21: qty 1, 1d supply, fill #0
  Filled 2022-01-12: qty 1, fill #0

## 2021-10-21 NOTE — Telephone Encounter (Signed)
T, he was not very clear as to why he stopped, my understanding was that he had some defective ones.  Can you please discuss with him to see if there was another reason?

## 2021-10-21 NOTE — Telephone Encounter (Signed)
Libre sensor and receiver sent to pharmacy.

## 2021-10-21 NOTE — Addendum Note (Signed)
Addended by: Kenyon Ana on: 10/21/2021 05:06 PM   Modules accepted: Orders

## 2021-10-21 NOTE — Telephone Encounter (Signed)
Patient Advocate Encounter   Prior authorization is required for [employer] [place], [facility]  Test billing for this insurance confirms that the plan covers G6 product line. Confirm with office before requesting authorization for G7. PA not submitted at this time.  Burnell Blanks, CPhT Rx Patient Advocate Phone: [phone]

## 2021-10-22 ENCOUNTER — Other Ambulatory Visit (HOSPITAL_COMMUNITY): Payer: Self-pay

## 2021-11-09 ENCOUNTER — Other Ambulatory Visit (HOSPITAL_COMMUNITY): Payer: Self-pay

## 2021-11-16 NOTE — Progress Notes (Signed)
This encounter was created in error - please disregard.

## 2021-11-22 ENCOUNTER — Encounter: Payer: 59 | Admitting: Cardiovascular Disease

## 2021-11-23 ENCOUNTER — Other Ambulatory Visit (HOSPITAL_COMMUNITY): Payer: Self-pay

## 2021-11-23 ENCOUNTER — Other Ambulatory Visit: Payer: Self-pay | Admitting: General Practice

## 2021-11-23 MED ORDER — NITROGLYCERIN 0.4 MG SL SUBL
SUBLINGUAL_TABLET | SUBLINGUAL | 3 refills | Status: DC
  Filled 2021-11-23: qty 25, 8d supply, fill #0
  Filled 2022-01-12: qty 25, 8d supply, fill #1

## 2021-11-25 ENCOUNTER — Other Ambulatory Visit (HOSPITAL_COMMUNITY): Payer: Self-pay

## 2021-12-07 ENCOUNTER — Encounter: Payer: Self-pay | Admitting: Cardiovascular Disease

## 2021-12-07 NOTE — Progress Notes (Unsigned)
Cardiology Office Note:    Date:  12/08/2021   ID:  Peter Keller, DOB November 04, 1975, MRN [id number]  PCP:  Peter Koch, MD   [facility] Providers Cardiologist:  Peter Moores, MD     Referring MD: Peter Keller, *   Chief Complaint  Patient presents with   Coronary Artery Disease        Hypertension        Hyperlipidemia    Oct. 24, 2022   Peter Keller is a 46 y.o. male with a hx of CAD , s/p stenting EF is normal - 60-65%.  Peter Keller is seen today for follow up of his coronary artery disease and coronary stenting  VS look good  Is exercising 3-4 times a month .  Does elliptical for 20 min, then on the bike for 10 min. Had some chest pain on Oct. 1. Slight ,  Persistent CP ,  took a SL NTG  He thinks it may have been GERD .   Was on nexium previously  Wants to double his protonix .   Trigs were 108 - Vascepa was started at that time  Occasionally forgets his nighttime Vascepa.    We discussed increasing exercise  Diet has improved , less pizza ,  still eats bread  EF form June, 2022 shows EF 60-65%   Having some muscle aches,  we discussed having him hold his Atorva and see if his muscle aches improve We will send him to the lipid clinic if the lipids are not improved or if he is not tolerating the atorvastatin.  April 29, 2021  Hx of prox LAD stenting  Has moderate CAD elsewhere  Doing well Has only tried the NTG once - and he thinks its likely GERD The ASA is causing lots of GERD  We changed from Brilinta to Plavix - brilinta caused lots of bleeding with scratches and such  Is having lots of bloody nose issues   Rec. Appt with ENT Will DC ASA and cont plavix   Still having lots of neck pain - due to a partially ruptured disc.   Is on atorvastatin and fenofibrate 160 mg a day   We discussed the benefits of weight loss  Wt today is 197 lbs   HR is 100 today - on Bystolic 10 QD Will increase bystolic to 10 BID Admits to drinking too  much - which may be the cause of his resting tachycardia   August 19, 2021 Peter Keller is seen today for follow up visit Hx of prox LAD stenting Moderate CAD elsewhere LDL in May 2023 was 70  Wants to stop his Plavix ( ran out a week or so ago )   Also the protonix does not seem to work as well as nexium He has lots of GERD  We tried Brilinta initially but it caused a significant amount of bleeding ( nosebleeds )   He wants to try alternating ASA with plavix every 2 week. ( I dont think this is a good idea and I informed him of this )   Has lost 20-25 lbs 9 with Mounjaro   LDL is 94 Trigs are markedly  improved.  Is on atorva 80, fenofibrate 160 ,   His LDL goal is 50-70 I suggested that he go to our lipid clinic to get started on a PCSK9 inhibitor or Inclisiran He wants to discuss with his endocrinologist  Oct. 9, 2023 No show, patient cancelled  Oct. 24, 2023  Peter Keller  is seen for follow up of his CAD, HLD  Wt is 194 lbs  Has been exercising regularly ,   Still drinking but has decrease   Has mild chest pain on occasion,  may be acid reflux The pain is not associated with with exertion  Takes plavix daily  Has more energy .   Gets his HR up to 128 when working out .       Past Medical History:  Diagnosis Date   Alcohol dependence (HCC)    Diabetes (HCC)    GERD (gastroesophageal reflux disease)    Hepatic steatosis    Hyperlipidemia    Hypertension    Obesity    Sinus tachycardia     Past Surgical History:  Procedure Laterality Date   CORONARY STENT INTERVENTION N/A 07/24/2020   Procedure: CORONARY STENT INTERVENTION;  Surgeon: Peter Hazel, MD;  [facility]: [facility];  Service: Cardiovascular;  Laterality: N/A;   INTRAVASCULAR ULTRASOUND/IVUS N/A 07/24/2020   Procedure: Intravascular Ultrasound/IVUS;  Surgeon: Peter Hazel, MD;  Location: [facility] CV [facility];  Service: Cardiovascular;  Laterality: N/A;   LEFT HEART CATH AND CORONARY  ANGIOGRAPHY N/A 07/24/2020   Procedure: LEFT HEART CATH AND CORONARY ANGIOGRAPHY;  Surgeon: Peter Hazel, MD;  Location: [facility];  Service: Cardiovascular;  Laterality: N/A;   none      Current Medications: Current Meds  Medication Sig   atorvastatin (LIPITOR) 80 MG tablet Take 1 tablet (80 mg total) by mouth daily.   Cholecalciferol (VITAMIN D) 400 UNITS capsule Take 500 Units by mouth daily.   clopidogrel (PLAVIX) 75 MG tablet Take 1 tablet (75 mg total) by mouth daily.   Continuous Blood Gluc Receiver (FREESTYLE LIBRE READER) DEVI Use as instructed   Continuous Blood Gluc Sensor (FREESTYLE LIBRE 3 SENSOR) MISC Change every 14 days as directed.   fenofibrate 160 MG tablet Take 1 tablet (160 mg total) by mouth daily.   JARDIANCE 25 MG TABS tablet Take 1 tablet (25 mg total) by mouth daily.   losartan-hydrochlorothiazide (HYZAAR) 100-25 MG tablet Take 1 tablet by mouth daily.   metFORMIN (GLUCOPHAGE-XR) 500 MG 24 hr tablet Take 4 tablets (2,000 mg total) by mouth daily with breakfast.   metoprolol succinate (TOPROL XL) 50 MG 24 hr tablet Take 3 tablets (150 mg total) by mouth daily. Take with or immediately following a meal.   nitroGLYCERIN (NITROSTAT) 0.4 MG SL tablet Place 1 tablet under the tongue every 5 minutes for 3 doses as needed for chest pain   pantoprazole (PROTONIX) 40 MG tablet Take 1 tablet (40 mg total) by mouth 2 (two) times daily.   tirzepatide (MOUNJARO) 10 MG/0.5ML Pen Inject 10 mg into the skin once a week.   [DISCONTINUED] metoprolol succinate (TOPROL-XL) 100 MG 24 hr tablet Take 1 tablet (100 mg total) by mouth daily. Take with or immediately following a meal.     Allergies:   Patient has no known allergies.   Social History   Socioeconomic History   Marital status: Married    Spouse name: Not on file   Number of children: Not on file   Years of education: Not on file   Highest education level: Not on file  Occupational History   Not on  file  Tobacco Use   Smoking status: Never   Smokeless tobacco: Never  Substance and Sexual Activity   Alcohol use: Yes    Comment: 3 alcoholic drinks daily   Drug use: No  Sexual activity: Not on file  Other Topics Concern   Not on file  Social History Narrative   Not on file   Social Determinants of Health   Financial Resource Strain: Not on file  Food Insecurity: Not on file  Transportation Needs: Not on file  Physical Activity: Not on file  Stress: Not on file  Social Connections: Not on file     Family History: The patient's family history includes Diabetes in his mother; Heart attack in his paternal grandmother; Heart disease in his paternal uncle; Hypertension in his mother. There is no history of Colon cancer, Esophageal cancer, or Stomach cancer.  ROS:   Please see the history of present illness.     All other systems reviewed and are negative.  EKGs/Labs/Other Studies Reviewed:    The following studies were reviewed today:   EKG:        Recent Labs: 01/12/2021: Hemoglobin 16.6; Platelets 163 06/18/2021: BUN 15; Creatinine, Ser 0.87; Potassium 3.5; Sodium 141 08/13/2021: ALT 17  Recent Lipid Panel    Component Value Date/Time   CHOL 167 08/13/2021 1034   TRIG 181 (H) 08/13/2021 1034   HDL 34 (L) 08/13/2021 1034   CHOLHDL 4.9 08/13/2021 1034   CHOLHDL 4 06/18/2021 1049   VLDL 42.2 (H) 06/18/2021 1049   LDLCALC 101 (H) 08/13/2021 1034   LDLDIRECT 94.0 06/18/2021 1049     Risk Assessment/Calculations:           Physical Exam:    Physical Exam: Blood pressure 125/88, pulse 100, resp. rate (!) 98, height 5\' 9"  (1.753 m), weight 194 lb 9.6 oz (88.3 kg).       GEN:  Well nourished, well developed in no acute distress HEENT: Normal NECK: No JVD; No carotid bruits LYMPHATICS: No lymphadenopathy CARDIAC: RRR , no murmurs, rubs, gallops RESPIRATORY:  Clear to auscultation without rales, wheezing or rhonchi  ABDOMEN: Soft, non-tender,  non-distended MUSCULOSKELETAL:  No edema; No deformity  SKIN: Warm and dry NEUROLOGIC:  Alert and oriented x 3    ASSESSMENT:    1. Coronary artery disease involving native coronary artery of native heart without angina pectoris   2. Essential hypertension, benign   3. Hyperlipidemia associated with type 2 diabetes mellitus (HCC)       PLAN:      1.  Coronary artery disease:    no angina pain .  Has some reflux like pain , not associated with exertion    2.  Hyperlipidemia:  LDL is stable,  trigs remain elevated.  Check labs today    3.  Sinus tachycardia:  Will increase toprol XL to 150 mg day     4. HTN:  advised continued weight loss. , increasing Toprol XL   Medication Adjustments/Labs and Tests Ordered: Current medicines are reviewed at length with the patient today.  Concerns regarding medicines are outlined above.  Orders Placed This Encounter  Procedures   ALT   Lipid panel   Meds ordered this encounter  Medications   metoprolol succinate (TOPROL XL) 50 MG 24 hr tablet    Sig: Take 3 tablets (150 mg total) by mouth daily. Take with or immediately following a meal.    Dispense:  270 tablet    Refill:  3     Patient Instructions  Medication Instructions:  Your physician has recommended you make the following change in your medication:  1) INCREASE metoprolol to 150 mg daily *If you need a refill on your cardiac medications before  your next appointment, please call your pharmacy*   Lab Work: TODAY: Lipids and ALT  If you have labs (blood work) drawn today and your tests are completely normal, you will receive your results only by: MyChart Message (if you have MyChart) OR A paper copy in the mail If you have any lab test that is abnormal or we need to change your treatment, we will call you to review the results.  Follow-Up: At [facility], you and your health needs are our priority.  As part of our continuing mission to provide you with  exceptional heart care, we have created designated Provider Care Teams.  These Care Teams include your primary Cardiologist (physician) and Advanced Practice Providers (APPs -  Physician Assistants and Nurse Practitioners) who all work together to provide you with the care you need, when you need it.  Your next appointment:   6 month(s)  The format for your next appointment:   In Person  Provider:   Kristeen Miss, MD      Important Information About Sugar         Signed, Kristeen Miss, MD  12/08/2021 1:36 PM    [facility]

## 2021-12-08 ENCOUNTER — Ambulatory Visit: Payer: 59 | Attending: Cardiovascular Disease | Admitting: Cardiovascular Disease

## 2021-12-08 ENCOUNTER — Encounter: Payer: Self-pay | Admitting: Cardiovascular Disease

## 2021-12-08 ENCOUNTER — Other Ambulatory Visit (HOSPITAL_COMMUNITY): Payer: Self-pay

## 2021-12-08 VITALS — BP 125/88 | HR 100 | Resp 98 | Ht 69.0 in | Wt 194.6 lb

## 2021-12-08 DIAGNOSIS — E1169 Type 2 diabetes mellitus with other specified complication: Secondary | ICD-10-CM | POA: Diagnosis not present

## 2021-12-08 DIAGNOSIS — I2510 Atherosclerotic heart disease of native coronary artery without angina pectoris: Secondary | ICD-10-CM

## 2021-12-08 DIAGNOSIS — I10 Essential (primary) hypertension: Secondary | ICD-10-CM | POA: Diagnosis not present

## 2021-12-08 DIAGNOSIS — E785 Hyperlipidemia, unspecified: Secondary | ICD-10-CM

## 2021-12-08 MED ORDER — METOPROLOL SUCCINATE ER 50 MG PO TB24
150.0000 mg | ORAL_TABLET | Freq: Every day | ORAL | 3 refills | Status: DC
  Filled 2021-12-08: qty 270, 90d supply, fill #0
  Filled 2022-03-02: qty 90, 30d supply, fill #1
  Filled 2022-04-06: qty 90, 30d supply, fill #2
  Filled 2022-04-27 – 2022-05-02 (×2): qty 90, 30d supply, fill #3
  Filled 2022-06-02: qty 90, 30d supply, fill #4
  Filled 2022-06-30: qty 90, 30d supply, fill #5
  Filled 2022-07-27: qty 90, 30d supply, fill #6
  Filled 2022-08-27: qty 90, 30d supply, fill #7
  Filled 2022-09-24: qty 90, 30d supply, fill #8
  Filled 2022-10-19: qty 90, 30d supply, fill #9

## 2021-12-08 NOTE — Patient Instructions (Signed)
Medication Instructions:  Your physician has recommended you make the following change in your medication:  1) INCREASE metoprolol to 150 mg daily *If you need a refill on your cardiac medications before your next appointment, please call your pharmacy*   Lab Work: TODAY: Lipids and ALT  If you have labs (blood work) drawn today and your tests are completely normal, you will receive your results only by: [place] (if you have MyChart) OR A paper copy in the mail If you have any lab test that is abnormal or we need to change your treatment, we will call you to review the results.  Follow-Up: At [facility], you and your health needs are our priority.  As part of our continuing mission to provide you with exceptional heart care, we have created designated Provider Care Teams.  These Care Teams include your primary Cardiologist (physician) and Advanced Practice Providers (APPs -  Physician Assistants and Nurse Practitioners) who all work together to provide you with the care you need, when you need it.  Your next appointment:   6 month(s)  The format for your next appointment:   In Person  Provider:   Mertie Moores, MD      Important Information About Sugar

## 2021-12-09 LAB — LIPID PANEL
Chol/HDL Ratio: 4.3 ratio (ref 0.0–5.0)
Cholesterol, Total: 137 mg/dL (ref 100–199)
HDL: 32 mg/dL — ABNORMAL LOW (ref >39)
LDL Chol Calc (NIH): 71 mg/dL (ref 0–99)
Triglycerides: 205 mg/dL — ABNORMAL HIGH (ref 0–149)
VLDL Cholesterol Cal: 34 mg/dL (ref 5–40)

## 2021-12-09 LAB — ALT: ALT: 37 IU/L (ref 0–44)

## 2021-12-14 ENCOUNTER — Telehealth: Payer: Self-pay

## 2021-12-14 DIAGNOSIS — Z79899 Other long term (current) drug therapy: Secondary | ICD-10-CM

## 2021-12-14 DIAGNOSIS — E785 Hyperlipidemia, unspecified: Secondary | ICD-10-CM

## 2021-12-14 DIAGNOSIS — E1169 Type 2 diabetes mellitus with other specified complication: Secondary | ICD-10-CM

## 2021-12-14 MED ORDER — EZETIMIBE 10 MG PO TABS
10.0000 mg | ORAL_TABLET | Freq: Every day | ORAL | 3 refills | Status: DC
  Filled 2021-12-14: qty 90, 90d supply, fill #0
  Filled 2022-03-16: qty 30, 30d supply, fill #1
  Filled 2022-04-13: qty 30, 30d supply, fill #2
  Filled 2022-05-10: qty 30, 30d supply, fill #3
  Filled 2022-06-30: qty 30, 30d supply, fill #4
  Filled 2022-07-27: qty 30, 30d supply, fill #5
  Filled 2022-08-27: qty 30, 30d supply, fill #6
  Filled 2022-09-24: qty 30, 30d supply, fill #7
  Filled 2022-10-19: qty 30, 30d supply, fill #8
  Filled 2022-11-16: qty 30, 30d supply, fill #9

## 2021-12-14 NOTE — Telephone Encounter (Signed)
Called and spoke with patient who understands and is agreeable to plan to start Zetia 10mg  daily. Repeat labs ordered and scheduled for 03/15/21.

## 2021-12-14 NOTE — Telephone Encounter (Signed)
-----   Message from Thayer Headings, MD sent at 12/13/2021  5:57 AM EDT ----- Katherene Ponto are much better LDL is 71.  His goal is 55 Add Zetia 10 mg a day Recheck lipids in 3 months  Cont diet, exercise, weight loss efforts   Overall, he is making nice progress

## 2021-12-15 ENCOUNTER — Other Ambulatory Visit (HOSPITAL_COMMUNITY): Payer: Self-pay

## 2022-01-12 ENCOUNTER — Other Ambulatory Visit (HOSPITAL_COMMUNITY): Payer: Self-pay

## 2022-02-19 ENCOUNTER — Other Ambulatory Visit: Payer: Self-pay | Admitting: Internal Medicine

## 2022-02-19 DIAGNOSIS — E1169 Type 2 diabetes mellitus with other specified complication: Secondary | ICD-10-CM

## 2022-02-21 ENCOUNTER — Other Ambulatory Visit (HOSPITAL_COMMUNITY): Payer: Self-pay

## 2022-02-21 MED ORDER — JARDIANCE 25 MG PO TABS
25.0000 mg | ORAL_TABLET | Freq: Every day | ORAL | 3 refills | Status: DC
  Filled 2022-02-21: qty 30, 30d supply, fill #0
  Filled 2022-03-22: qty 30, 30d supply, fill #1
  Filled 2022-04-27: qty 30, 30d supply, fill #2
  Filled 2022-05-26: qty 30, 30d supply, fill #3
  Filled 2022-06-30: qty 30, 30d supply, fill #4
  Filled 2022-07-27: qty 30, 30d supply, fill #5
  Filled 2022-08-27: qty 30, 30d supply, fill #6
  Filled 2022-09-24: qty 30, 30d supply, fill #7

## 2022-02-21 MED ORDER — METFORMIN HCL ER 500 MG PO TB24
2000.0000 mg | ORAL_TABLET | Freq: Every day | ORAL | 3 refills | Status: DC
  Filled 2022-02-21: qty 120, 30d supply, fill #0
  Filled 2022-03-28: qty 120, 30d supply, fill #1
  Filled 2022-04-27: qty 120, 30d supply, fill #2
  Filled 2022-05-26: qty 120, 30d supply, fill #3
  Filled 2022-06-30: qty 120, 30d supply, fill #4
  Filled 2022-07-27: qty 120, 30d supply, fill #5
  Filled 2022-08-27: qty 120, 30d supply, fill #6

## 2022-02-22 ENCOUNTER — Other Ambulatory Visit (HOSPITAL_COMMUNITY): Payer: Self-pay

## 2022-02-22 ENCOUNTER — Other Ambulatory Visit: Payer: Self-pay

## 2022-02-23 ENCOUNTER — Encounter: Payer: Self-pay | Admitting: Internal Medicine

## 2022-02-23 ENCOUNTER — Other Ambulatory Visit: Payer: Self-pay | Admitting: Internal Medicine

## 2022-02-23 ENCOUNTER — Other Ambulatory Visit (HOSPITAL_COMMUNITY): Payer: Self-pay

## 2022-02-23 ENCOUNTER — Ambulatory Visit: Payer: 59 | Admitting: Internal Medicine

## 2022-02-23 VITALS — BP 116/70 | HR 97 | Ht 69.0 in | Wt 197.4 lb

## 2022-02-23 DIAGNOSIS — E785 Hyperlipidemia, unspecified: Secondary | ICD-10-CM | POA: Diagnosis not present

## 2022-02-23 DIAGNOSIS — Z6832 Body mass index (BMI) 32.0-32.9, adult: Secondary | ICD-10-CM

## 2022-02-23 DIAGNOSIS — E1169 Type 2 diabetes mellitus with other specified complication: Secondary | ICD-10-CM

## 2022-02-23 DIAGNOSIS — E6609 Other obesity due to excess calories: Secondary | ICD-10-CM

## 2022-02-23 LAB — COMPREHENSIVE METABOLIC PANEL
ALT: 56 U/L — ABNORMAL HIGH (ref 0–53)
AST: 81 U/L — ABNORMAL HIGH (ref 0–37)
Albumin: 4.9 g/dL (ref 3.5–5.2)
Alkaline Phosphatase: 51 U/L (ref 39–117)
BUN: 18 mg/dL (ref 6–23)
CO2: 28 mEq/L (ref 19–32)
Calcium: 10.2 mg/dL (ref 8.4–10.5)
Chloride: 100 mEq/L (ref 96–112)
Creatinine, Ser: 0.98 mg/dL (ref 0.40–1.50)
GFR: 92.57 mL/min (ref >60.00)
Glucose, Bld: 135 mg/dL — ABNORMAL HIGH (ref 70–99)
Potassium: 3.7 mEq/L (ref 3.5–5.1)
Sodium: 140 mEq/L (ref 135–145)
Total Bilirubin: 0.9 mg/dL (ref 0.2–1.2)
Total Protein: 8.7 g/dL — ABNORMAL HIGH (ref 6.0–8.3)

## 2022-02-23 LAB — POCT GLYCOSYLATED HEMOGLOBIN (HGB A1C): Hemoglobin A1C: 6.6 % — AB (ref 4.0–5.6)

## 2022-02-23 LAB — LIPID PANEL
Cholesterol: 131 mg/dL (ref 0–200)
HDL: 37.4 mg/dL — ABNORMAL LOW (ref >39.00)
NonHDL: 93.27
Total CHOL/HDL Ratio: 3
Triglycerides: 248 mg/dL — ABNORMAL HIGH (ref 0.0–149.0)
VLDL: 49.6 mg/dL — ABNORMAL HIGH (ref 0.0–40.0)

## 2022-02-23 LAB — LDL CHOLESTEROL, DIRECT: Direct LDL: 74 mg/dL

## 2022-02-23 MED ORDER — MOUNJARO 10 MG/0.5ML ~~LOC~~ SOAJ
10.0000 mg | SUBCUTANEOUS | 3 refills | Status: DC
  Filled 2022-02-23: qty 6, 84d supply, fill #0
  Filled 2022-03-04: qty 2, 28d supply, fill #0
  Filled 2022-04-06 – 2022-05-10 (×2): qty 2, 28d supply, fill #1
  Filled 2022-09-24: qty 2, 28d supply, fill #2

## 2022-02-23 NOTE — Progress Notes (Signed)
Patient ID: Peter Keller, male   DOB: February 25, 1975, 47 y.o.   MRN: [id number]  HPI: Peter Keller is a 47 y.o.-year-old male, returning for follow-up for DM2, dx in 04/2013 (A1c was 11%), non-insulin-dependent, uncontrolled, with long term complications(CAD - s/p drug-eluting stent).  Last visit 4 months ago.  Interim history: No increased urination, blurry vision, nausea, chest pain.  DM2: Reviewed HbA1c levels: Lab Results  Component Value Date   HGBA1C 6.3 (A) 10/20/2021   HGBA1C 6.1 06/18/2021   HGBA1C 6.7 (A) 03/17/2021   He is on: - Metformin 1000 mg 2x a day with meals - may forget the dose at night >> metformin ER 2000 >> 1500-2000 mg in the a.m.  - Mounjaro 5 >> 10 mg weekly - Jardiance 25 mg daily - Glipizide 5 mg before a larger dinner  -he seldom has to take this Patient was initially on Janumet extended-release, which was subsequently stopped.  Amaryl was added 07/2013, but then switched to Invokana 100 mg daily in 09/2013 because of weight gain. Trulicity 3.47 mg weekly was added in 11/2013 for help with weight loss >> increased to 1.5 mg weekly in 01/2014 >> increased to 3 mg weekly and 01/2019. Previously on metformin extended-release - started  in 03/2014 Previously on [place].  Now on Jardiance per insurance preference  He is checking his blood sugars >4x a day:  Previously: - am: 140-150 - 2h after b'fast: n/c - lunch: n/c - 2h after lunch: 160 - dinner: n/c - 2h after dinner: 160 - bedtime: n/c  Previously:   Lowest sugar was >70 >> 60s; it is unclear at which CBG level he has hypoglycemia awareness Highest sugar was 220 >> 260s.  Glucometer: One Probation officer IQ.   He was exercising regularly at the gym >>  exerciseing with personal trainer at home >> not now.  -No CKD, last BUN/creatinine:  Lab Results  Component Value Date   BUN 15 06/18/2021   CREATININE 0.87 06/18/2021   -+ HL;  last set of lipids: Lab Results  Component Value Date    CHOL 137 12/08/2021   HDL 32 (L) 12/08/2021   LDLCALC 71 12/08/2021   LDLDIRECT 94.0 06/18/2021   TRIG 205 (H) 12/08/2021   CHOLHDL 4.3 12/08/2021  On Lipitor 20 >> 40 >> 80.  We restarted fenofibrate at 160 mg daily.  He was started on Vascepa 2 g twice a day, but this was causing diarrhea >> we stopped this.  Cardiology added Zetia 10 mg daily.  - last eye exam was 03/10/2021: No DR reportedly. Dr. Wyatt Portela.  -No numbness and tingling in his feet.  Last foot exam 10/21/2018  She also has a history of HTN. When I saw him in 01/2017, he just went skiing and forgot BP meds: drank 2 cups of coffee >> HR 125-145 and BP when he returned 213/105.  He was started on blood pressure medication then. He also has a history of GERD.  ROS: + see HPI  I reviewed pt's medications, allergies, PMH, social hx, family hx, and changes were documented in the history of present illness. Otherwise, unchanged from my initial visit note.  Past Medical History:  Diagnosis Date   Alcohol dependence ([place])    Diabetes ([place])    GERD (gastroesophageal reflux disease)    Hepatic steatosis    Hyperlipidemia    Hypertension    Obesity    Sinus tachycardia    Past Surgical History:  Procedure Laterality Date  CORONARY STENT INTERVENTION N/A 07/24/2020   Procedure: CORONARY STENT INTERVENTION;  Surgeon: Kathleene Hazel, MD;  [facility];  Service: Cardiovascular;  Laterality: N/A;   INTRAVASCULAR ULTRASOUND/IVUS N/A 07/24/2020   Procedure: Intravascular Ultrasound/IVUS;  Surgeon: Kathleene Hazel, MD;  [facility]: [facility];  Service: Cardiovascular;  Laterality: N/A;   LEFT HEART CATH AND CORONARY ANGIOGRAPHY N/A 07/24/2020   Procedure: LEFT HEART CATH AND CORONARY ANGIOGRAPHY;  Surgeon: Kathleene Hazel, MD;  Location: [facility];  Service: Cardiovascular;  Laterality: N/A;   none     History   Social History   Marital Status: Married    Spouse  Name: N/A   Number of Children: 2   Occupational History    Nurse, adult    Social History Main Topics   Smoking status: Never Smoker    Smokeless tobacco: Never Used   Alcohol Use: Yes     Comment: 3 alcoholic drinks daily   Drug Use: No   Current Outpatient Medications on File Prior to Visit  Medication Sig Dispense Refill   atorvastatin (LIPITOR) 80 MG tablet Take 1 tablet (80 mg total) by mouth daily. 90 tablet 3   Cholecalciferol (VITAMIN D) 400 UNITS capsule Take 500 Units by mouth daily.     clopidogrel (PLAVIX) 75 MG tablet Take 1 tablet (75 mg total) by mouth daily. 90 tablet 1   Continuous Blood Gluc Receiver (FREESTYLE LIBRE READER) DEVI Use as instructed 1 each 0   Continuous Blood Gluc Sensor (FREESTYLE LIBRE 3 SENSOR) MISC Change every 14 days as directed. 6 each 3   ezetimibe (ZETIA) 10 MG tablet Take 1 tablet (10 mg total) by mouth daily. 90 tablet 3   fenofibrate 160 MG tablet Take 1 tablet (160 mg total) by mouth daily. 90 tablet 3   JARDIANCE 25 MG TABS tablet Take 1 tablet (25 mg total) by mouth daily. 90 tablet 3   losartan-hydrochlorothiazide (HYZAAR) 100-25 MG tablet Take 1 tablet by mouth daily. 90 tablet 3   metFORMIN (GLUCOPHAGE-XR) 500 MG 24 hr tablet Take 4 tablets (2,000 mg total) by mouth daily with breakfast. 360 tablet 3   metoprolol succinate (TOPROL XL) 50 MG 24 hr tablet Take 3 tablets (150 mg total) by mouth daily. Take with or immediately following a meal. 270 tablet 3   nitroGLYCERIN (NITROSTAT) 0.4 MG SL tablet Place 1 tablet under the tongue every 5 minutes for 3 doses as needed for chest pain 25 tablet 3   pantoprazole (PROTONIX) 40 MG tablet Take 1 tablet (40 mg total) by mouth 2 (two) times daily. 180 tablet 3   tirzepatide (MOUNJARO) 10 MG/0.5ML Pen Inject 10 mg into the skin once a week. 6 mL 3   No current facility-administered medications on file prior to visit.   No Known Allergies Family History  Problem Relation Age of Onset    Hypertension Mother    Diabetes Mother    Heart disease Paternal Uncle    Heart attack Paternal Grandmother    Colon cancer Neg Hx    Esophageal cancer Neg Hx    Stomach cancer Neg Hx    PE: BP 116/70 (BP Location: Left Arm, Patient Position: Sitting, Cuff Size: Normal)   Pulse 97   Ht 5\' 9"  (1.753 m)   Wt 197 lb 6.4 oz (89.5 kg)   SpO2 98%   BMI 29.15 kg/m  Wt Readings from Last 3 Encounters:  02/23/22 197 lb 6.4 oz (89.5  kg)  12/08/21 194 lb 9.6 oz (88.3 kg)  10/20/21 194 lb 9.6 oz (88.3 kg)   Constitutional: overweight, in NAD Eyes: no exophthalmos ENT: no masses palpated in neck, no cervical lymphadenopathy Cardiovascular: tachycardia, RR, No MRG Respiratory: CTA B Musculoskeletal: no deformities Skin: no rashes Neurological: no tremor with outstretched hands  ASSESSMENT: 1. DM2, non-insulin-dependent, uncontrolled, with complications: - CAD, s/p drug-eluting stent 07/2020  2. Hypertriglyceridemia  3.  Obesity class I  - Right upper quadrant ultrasound from 08/07/2013 shows probable fatty infiltration, confirmed on the abdominal MRI 08/13/2013 - AST> ALT, consistent with excessive alcohol use.  He stopped drinking alcohol several times in the past, but restarted afterwards.  PLAN:  1. Patient with history of uncontrolled type 2 diabetes, improved last year and after adjusting his diabetic regimen and improved with diet.  His latest HbA1c obtained at last visit, was 6.3%, slightly higher than before but still at goal.  He is currently on metformin ER (had diarrhea with regular metformin), sulfonylurea as needed with a large meal, SGLT2 inhibitor, and GLP-1/GIP receptor agonist.  He tolerated the above regimen well.  Since last visit, he restarted the CGM - now [place] 3. CGM interpretation: -At today's visit, we reviewed his CGM downloads: It appears that 82% of values are in target range (goal >70%), while 18% are higher than 180 (goal <25%), and 0% are lower than 70  (goal <4%).  The calculated average blood sugar is 155.  The projected HbA1c for the next 3 months (GMI) is 7.0%. -Reviewing the CGM trends, sugars appear to be slightly higher in the target range, with occasional high blood sugars after breakfast and occasional  higher blood sugars after dinner.  He has been in vacation during the last 2 weeks and has had higher blood sugars after breakfast while there, up to 260s.  He did not usually take glipizide before breakfast at that time.  He mentions that he tried to take it once and sugars dropped afterwards but, upon questioning, he took it after the meal.  We discussed that ideally he would take glipizide 30 minutes before a larger meal. -Otherwise, we discussed about continuing the current regimen.  He is wondering whether he should increase the Mounjaro dose, but for now, I do not feel that this is necessary.  He is planning to restart exercise, going to the gym.  Sugars are excellent when he was exercising consistently before. - I suggested to:  Patient Instructions  Please continue: - Metformin ER 1500-2000 mg in a.m. - Jardiance 25 mg daily in am - Glipizide 5 mg 15-30 min before a larger meal - Mounjaro 10 mg weekly  Please stop at the lab.  Please return in 3-4 months.  - we checked his HbA1c: 6.6% (higher) - advised to check sugars at different times of the day - 4x a day, rotating check times - advised for yearly eye exams >> he is UTD - return to clinic in 3-4 months  2. Hypertriglyceridemia -Reviewed latest lipid panel from 11/2021: LDL above our target of less than 55 due to cardiovascular disease, but improved from before; triglycerides slightly high, HDL low: Lab Results  Component Value Date   CHOL 137 12/08/2021   HDL 32 (L) 12/08/2021   LDLCALC 71 12/08/2021   LDLDIRECT 94.0 06/18/2021   TRIG 205 (H) 12/08/2021   CHOLHDL 4.3 12/08/2021  -He is on Lipitor 80 mg daily, Tricor 160 mg daily.  He tried Vascepa but this was  causing  diarrhea.  Since last visit, he started Zetia 10 mg daily. -He continues to work on reducing alcohol and improving diet  3.  Overweight -continue SGLT 2 inhibitor and GLP-1/GIP receptor agonist which should also help with weight loss -She lost 13 pounds before the last 3 visits combined -At today's visit, he lost 3 more pounds, but gained them back since last OV  Philemon Kingdom, MD PhD [facility]

## 2022-02-23 NOTE — Patient Instructions (Addendum)
Please continue: - Metformin ER 1500-2000 mg in a.m. - Jardiance 25 mg daily in am - Glipizide 5 mg 15-30 min before a larger meal - Mounjaro 10 mg weekly  Please stop at the lab.  Please return in 3-4 months.

## 2022-03-02 ENCOUNTER — Other Ambulatory Visit (HOSPITAL_COMMUNITY): Payer: Self-pay

## 2022-03-02 ENCOUNTER — Other Ambulatory Visit: Payer: Self-pay | Admitting: Cardiovascular Disease

## 2022-03-02 MED ORDER — CLOPIDOGREL BISULFATE 75 MG PO TABS
75.0000 mg | ORAL_TABLET | Freq: Every day | ORAL | 2 refills | Status: DC
  Filled 2022-03-02: qty 30, 30d supply, fill #0
  Filled 2022-03-28: qty 30, 30d supply, fill #1
  Filled 2022-04-27: qty 30, 30d supply, fill #2
  Filled 2022-05-26: qty 30, 30d supply, fill #3
  Filled 2022-06-30: qty 30, 30d supply, fill #4
  Filled 2022-07-27: qty 30, 30d supply, fill #5
  Filled 2022-08-27: qty 30, 30d supply, fill #6
  Filled 2022-09-24: qty 30, 30d supply, fill #7
  Filled 2022-10-19: qty 30, 30d supply, fill #8

## 2022-03-03 ENCOUNTER — Other Ambulatory Visit (HOSPITAL_COMMUNITY): Payer: Self-pay

## 2022-03-03 ENCOUNTER — Other Ambulatory Visit: Payer: Self-pay

## 2022-03-04 ENCOUNTER — Other Ambulatory Visit (HOSPITAL_COMMUNITY): Payer: Self-pay

## 2022-03-15 ENCOUNTER — Other Ambulatory Visit: Payer: 59

## 2022-03-16 ENCOUNTER — Other Ambulatory Visit: Payer: Self-pay

## 2022-03-22 ENCOUNTER — Encounter: Payer: Self-pay | Admitting: Cardiovascular Disease

## 2022-03-22 NOTE — Telephone Encounter (Signed)
Called patient about his message. Patient stated he has been having chest pain that comes and goes for the past week. Patient thought it might be GERD and took some Nexium for 4 days. Patient also took nitro today when he had the chest pain come back. Patient stated it did relieve him of the chest pain and he did not have to take it again. Patient stated he would like to see Dr. Acie Fredrickson. Made patient an appointment with DOD, Dr. Caryl Comes tomorrow to get evaluated, since Dr. Acie Fredrickson had no openings. Patient has a history of CAD and stent in June 2022. Informed patient that if he takes nitro and it does not help to call 911. Patient verbalized understanding and will see Dr. Caryl Comes tomorrow.

## 2022-03-23 ENCOUNTER — Encounter: Payer: Self-pay | Admitting: Internal Medicine

## 2022-03-23 ENCOUNTER — Other Ambulatory Visit (HOSPITAL_COMMUNITY): Payer: Self-pay

## 2022-03-23 ENCOUNTER — Ambulatory Visit: Payer: 59 | Attending: Internal Medicine | Admitting: Internal Medicine

## 2022-03-23 VITALS — BP 130/86 | HR 103 | Ht 69.0 in | Wt 199.6 lb

## 2022-03-23 DIAGNOSIS — I200 Unstable angina: Secondary | ICD-10-CM | POA: Diagnosis not present

## 2022-03-23 DIAGNOSIS — R079 Chest pain, unspecified: Secondary | ICD-10-CM | POA: Diagnosis not present

## 2022-03-23 DIAGNOSIS — I2510 Atherosclerotic heart disease of native coronary artery without angina pectoris: Secondary | ICD-10-CM

## 2022-03-23 MED ORDER — ISOSORBIDE MONONITRATE ER 30 MG PO TB24
30.0000 mg | ORAL_TABLET | Freq: Every day | ORAL | 1 refills | Status: DC
  Filled 2022-03-23: qty 30, 30d supply, fill #0
  Filled 2022-04-27: qty 30, 30d supply, fill #1

## 2022-03-23 NOTE — Progress Notes (Signed)
Patient Care Team: Peter Koch, MD as PCP - General (Internal Medicine) Peter Keller, Peter Cheng, MD as PCP - Cardiology (Cardiology)   HPI  Peter Keller is a 47 y.o. male Seen as an add-on today because of chest pain; Sharp with some DOE, durations about 30 or 40 seconds.  Occurs both with rest and can be aggravated by exertion but not clearly triggered by exertion.   He has significant problems GE reflux disease.  Because of this he took Nexium which is what he finds helps the most.  He was concerned about the interaction of Nexium and clopidogrel, and so he took actually an extra clopidogrel the other day when the chest pain started.   Initial presentation had been back pain with dyspnea  Does not smoke.  Alcohol is exuberant greater than 5 drinks per day   DATE TEST EF   6//22 CTA   % LAD disease  6/22 LHC  40-45 % LAD 80>>stent   6/22 Echo  60-65%         Date Cr K Hgb LDL  11/22   16.6   1/24 0.98 3.7   71 10/23 (calc)           DM   Records and Results Reviewed   Past Medical History:  Diagnosis Date   Alcohol dependence ([place])    Diabetes ([place])    GERD (gastroesophageal reflux disease)    Hepatic steatosis    Hyperlipidemia    Hypertension    Obesity    Sinus tachycardia     Past Surgical History:  Procedure Laterality Date   CORONARY STENT INTERVENTION N/A 07/24/2020   Procedure: CORONARY STENT INTERVENTION;  Surgeon: Burnell Blanks, MD;  Location: [place] [facility];  Service: Cardiovascular;  Laterality: N/A;   INTRAVASCULAR ULTRASOUND/IVUS N/A 07/24/2020   Procedure: Intravascular Ultrasound/IVUS;  Surgeon: Burnell Blanks, MD;  Location: [facility];  Service: Cardiovascular;  Laterality: N/A;   LEFT HEART CATH AND CORONARY ANGIOGRAPHY N/A 07/24/2020   Procedure: LEFT HEART CATH AND CORONARY ANGIOGRAPHY;  Surgeon: Burnell Blanks, MD;  Location: [place] [facility];  Service: Cardiovascular;  Laterality: N/A;    none      Current Meds  Medication Sig   atorvastatin (LIPITOR) 80 MG tablet Take 1 tablet (80 mg total) by mouth daily.   Cholecalciferol (VITAMIN D) 400 UNITS capsule Take 500 Units by mouth daily.   clopidogrel (PLAVIX) 75 MG tablet Take 1 tablet (75 mg total) by mouth daily.   Continuous Blood Gluc Sensor (FREESTYLE LIBRE 3 SENSOR) MISC Change every 14 days as directed.   ezetimibe (ZETIA) 10 MG tablet Take 1 tablet (10 mg total) by mouth daily.   fenofibrate 160 MG tablet Take 1 tablet (160 mg total) by mouth daily.   JARDIANCE 25 MG TABS tablet Take 1 tablet (25 mg total) by mouth daily.   losartan-hydrochlorothiazide (HYZAAR) 100-25 MG tablet Take 1 tablet by mouth daily.   metFORMIN (GLUCOPHAGE-XR) 500 MG 24 hr tablet Take 4 tablets (2,000 mg total) by mouth daily with breakfast.   metoprolol succinate (TOPROL XL) 50 MG 24 hr tablet Take 3 tablets (150 mg total) by mouth daily. Take with or immediately following a meal.   nitroGLYCERIN (NITROSTAT) 0.4 MG SL tablet Place 1 tablet under the tongue every 5 minutes for 3 doses as needed for chest pain   omeprazole (PRILOSEC) 40 MG capsule Take 40 mg by mouth daily.  pantoprazole (PROTONIX) 40 MG tablet Take 1 tablet (40 mg total) by mouth 2 (two) times daily.   tirzepatide (MOUNJARO) 10 MG/0.5ML Pen Inject 10 mg into the skin once a week.    No Known Allergies    Review of Systems negative except from HPI and PMH  Physical Exam BP 130/86   Pulse (!) 103   Ht 5\' 9"  (1.753 m)   Wt 199 lb 9.6 oz (90.5 kg)   SpO2 96%   BMI 29.48 kg/m  Well developed and well nourished in no acute distress HENT normal E scleral and icterus clear Neck Supple JVP flat; carotids brisk and full Clear to ausculation Regular rate and rhythm, no murmurs gallops or rub Soft with active bowel sounds No clubbing cyanosis  Edema Alert and oriented, grossly normal motor and sensory function Skin Warm and diaphoretic  ECG sinus @  84 17/09/36  CrCl cannot be calculated (Patient's most recent lab result is older than the maximum 21 days allowed.).   Assessment and  Plan  CAD with prior LAD stenting   Diabetes  Alcohol use-excessive  Sinus tachycardia  Hypertension   Atypical chest pain.  Most concerning is that last time was also atypical but quite different from now.  Will undertake stress testing with lexiscan -- reveiwed briefly with Dr Loletha Grayer Mac  Right now we will start him on isosorbide 30 mg daily in addition until his stress scan  Discussed also sinus tachycardia, he might be a candidate for clonidine for central sympatholysis. Continue beta blocker   Encouraged him to discontinue alcohol use  Blood pressure is well-controlled.  Current medicines are reviewed at length with the patient today .  The patient does not  have concerns regarding medicines.

## 2022-03-23 NOTE — Patient Instructions (Addendum)
Medication Instructions:  Your physician has recommended you make the following change in your medication:   ** Begin Isosorbide 30mg  - 1 tablet by mouth daily  *If you need a refill on your cardiac medications before your next appointment, please call your pharmacy*   Lab Work: None ordered.  If you have labs (blood work) drawn today and your tests are completely normal, you will receive your results only by: [place] (if you have MyChart) OR A paper copy in the mail If you have any lab test that is abnormal or we need to change your treatment, we will call you to review the results.   Testing/Procedures: Your physician has requested that you have a lexiscan myoview. For further information please visit [email or web address]. Please follow instruction sheet, as given.    Follow-Up: At [facility], you and your health needs are our priority.  As part of our continuing mission to provide you with exceptional heart care, we have created designated Provider Care Teams.  These Care Teams include your primary Cardiologist (physician) and Advanced Practice Providers (APPs -  Physician Assistants and Nurse Practitioners) who all work together to provide you with the care you need, when you need it.  We recommend signing up for the patient portal called "MyChart".  Sign up information is provided on this After Visit Summary.  MyChart is used to connect with patients for Virtual Visits (Telemedicine).  Patients are able to view lab/test results, encounter notes, upcoming appointments, etc.  Non-urgent messages can be sent to your provider as well.   To learn more about what you can do with MyChart, go to [email or web address].    Your next appointment:   To be determined

## 2022-03-24 ENCOUNTER — Telehealth (HOSPITAL_COMMUNITY): Payer: Self-pay

## 2022-03-24 NOTE — Telephone Encounter (Signed)
Spoke with the patient, detailed instructions given. He stated that he would be here for his test. Asked to call back with any questions. S.Makalya Nave EMTP/CCT 

## 2022-03-29 ENCOUNTER — Ambulatory Visit (HOSPITAL_COMMUNITY): Payer: 59 | Attending: Internal Medicine

## 2022-03-29 DIAGNOSIS — R079 Chest pain, unspecified: Secondary | ICD-10-CM | POA: Insufficient documentation

## 2022-03-29 DIAGNOSIS — I2510 Atherosclerotic heart disease of native coronary artery without angina pectoris: Secondary | ICD-10-CM | POA: Diagnosis present

## 2022-03-29 LAB — MYOCARDIAL PERFUSION IMAGING
LV dias vol: 59 mL (ref 62–150)
LV sys vol: 17 mL
Nuc Stress EF: 71 %
Peak HR: 121 {beats}/min
Rest HR: 98 {beats}/min
Rest Nuclear Isotope Dose: 10.2 mCi
SDS: 0
SRS: 0
SSS: 0
ST Depression (mm): 0 mm
Stress Nuclear Isotope Dose: 29.6 mCi
TID: 1.02

## 2022-03-29 MED ORDER — TECHNETIUM TC 99M TETROFOSMIN IV KIT
29.6000 | PACK | Freq: Once | INTRAVENOUS | Status: AC | PRN
  Administered 2022-03-29: 29.6 via INTRAVENOUS

## 2022-03-29 MED ORDER — TECHNETIUM TC 99M TETROFOSMIN IV KIT
10.2000 | PACK | Freq: Once | INTRAVENOUS | Status: AC | PRN
  Administered 2022-03-29: 10.2 via INTRAVENOUS

## 2022-03-29 MED ORDER — REGADENOSON 0.4 MG/5ML IV SOLN
0.4000 mg | Freq: Once | INTRAVENOUS | Status: AC
  Administered 2022-03-29: 0.4 mg via INTRAVENOUS

## 2022-04-01 ENCOUNTER — Other Ambulatory Visit (HOSPITAL_COMMUNITY): Payer: Self-pay

## 2022-04-06 ENCOUNTER — Other Ambulatory Visit (HOSPITAL_COMMUNITY): Payer: Self-pay

## 2022-04-06 ENCOUNTER — Other Ambulatory Visit: Payer: Self-pay | Admitting: Cardiovascular Disease

## 2022-04-06 MED ORDER — LOSARTAN POTASSIUM-HCTZ 100-25 MG PO TABS
1.0000 | ORAL_TABLET | Freq: Every day | ORAL | 3 refills | Status: DC
  Filled 2022-04-06: qty 30, 30d supply, fill #0
  Filled 2022-05-10: qty 30, 30d supply, fill #1
  Filled 2022-06-06: qty 30, 30d supply, fill #2
  Filled 2022-06-30: qty 30, 30d supply, fill #3
  Filled 2022-07-27: qty 30, 30d supply, fill #4
  Filled 2022-08-27: qty 30, 30d supply, fill #5
  Filled 2022-09-24: qty 30, 30d supply, fill #6
  Filled 2022-10-19: qty 30, 30d supply, fill #7
  Filled 2022-11-16: qty 30, 30d supply, fill #8
  Filled 2022-12-19: qty 30, 30d supply, fill #9
  Filled 2023-01-18: qty 30, 30d supply, fill #10
  Filled 2023-02-11: qty 30, 30d supply, fill #11

## 2022-04-07 ENCOUNTER — Other Ambulatory Visit: Payer: Self-pay

## 2022-04-07 ENCOUNTER — Other Ambulatory Visit (HOSPITAL_COMMUNITY): Payer: Self-pay

## 2022-04-08 ENCOUNTER — Other Ambulatory Visit: Payer: Self-pay | Admitting: Internal Medicine

## 2022-04-08 ENCOUNTER — Other Ambulatory Visit (HOSPITAL_COMMUNITY): Payer: Self-pay

## 2022-04-08 DIAGNOSIS — E785 Hyperlipidemia, unspecified: Secondary | ICD-10-CM

## 2022-04-11 ENCOUNTER — Other Ambulatory Visit (HOSPITAL_COMMUNITY): Payer: Self-pay

## 2022-04-12 ENCOUNTER — Encounter: Payer: Self-pay | Admitting: Internal Medicine

## 2022-04-12 LAB — HM DIABETES EYE EXAM

## 2022-04-14 ENCOUNTER — Other Ambulatory Visit (HOSPITAL_COMMUNITY): Payer: Self-pay

## 2022-04-15 ENCOUNTER — Telehealth: Payer: Self-pay

## 2022-04-15 ENCOUNTER — Other Ambulatory Visit: Payer: Self-pay | Admitting: Internal Medicine

## 2022-04-15 ENCOUNTER — Other Ambulatory Visit (HOSPITAL_COMMUNITY): Payer: Self-pay

## 2022-04-15 ENCOUNTER — Encounter: Payer: Self-pay | Admitting: Internal Medicine

## 2022-04-15 ENCOUNTER — Other Ambulatory Visit (HOSPITAL_BASED_OUTPATIENT_CLINIC_OR_DEPARTMENT_OTHER): Payer: Self-pay

## 2022-04-15 MED ORDER — TIRZEPATIDE 12.5 MG/0.5ML ~~LOC~~ SOAJ
12.5000 mg | SUBCUTANEOUS | 5 refills | Status: DC
  Filled 2022-04-15: qty 2, 28d supply, fill #1
  Filled 2022-04-15 (×3): qty 2, 28d supply, fill #0
  Filled 2022-05-26: qty 2, 28d supply, fill #1
  Filled 2022-06-30: qty 2, 28d supply, fill #2
  Filled 2022-07-27: qty 2, 28d supply, fill #3
  Filled 2022-08-27: qty 2, 28d supply, fill #4

## 2022-04-15 NOTE — Telephone Encounter (Signed)
Mounjaro 10 mg dose is on backorder. They have 12.5 mg dose available.

## 2022-04-19 ENCOUNTER — Other Ambulatory Visit (HOSPITAL_COMMUNITY): Payer: Self-pay

## 2022-04-20 ENCOUNTER — Encounter: Payer: Self-pay | Admitting: Internal Medicine

## 2022-04-27 ENCOUNTER — Other Ambulatory Visit: Payer: Self-pay

## 2022-04-29 ENCOUNTER — Other Ambulatory Visit (HOSPITAL_COMMUNITY): Payer: Self-pay

## 2022-05-04 ENCOUNTER — Other Ambulatory Visit (HOSPITAL_COMMUNITY): Payer: Self-pay

## 2022-05-26 ENCOUNTER — Other Ambulatory Visit: Payer: Self-pay | Admitting: Internal Medicine

## 2022-05-26 ENCOUNTER — Other Ambulatory Visit: Payer: Self-pay

## 2022-05-26 ENCOUNTER — Other Ambulatory Visit (HOSPITAL_COMMUNITY): Payer: Self-pay

## 2022-05-26 MED ORDER — ISOSORBIDE MONONITRATE ER 30 MG PO TB24
30.0000 mg | ORAL_TABLET | Freq: Every day | ORAL | 3 refills | Status: DC
  Filled 2022-05-26: qty 30, 30d supply, fill #0
  Filled 2022-06-30: qty 30, 30d supply, fill #1
  Filled 2022-07-27: qty 30, 30d supply, fill #2
  Filled 2022-08-27: qty 30, 30d supply, fill #3
  Filled 2022-10-19: qty 30, 30d supply, fill #4
  Filled 2022-11-16: qty 30, 30d supply, fill #5
  Filled 2022-12-19: qty 30, 30d supply, fill #6

## 2022-06-10 ENCOUNTER — Ambulatory Visit: Payer: 59 | Attending: Cardiovascular Disease

## 2022-06-10 DIAGNOSIS — Z79899 Other long term (current) drug therapy: Secondary | ICD-10-CM

## 2022-06-10 DIAGNOSIS — E1169 Type 2 diabetes mellitus with other specified complication: Secondary | ICD-10-CM

## 2022-06-11 LAB — LIPID PANEL
Chol/HDL Ratio: 4.1 ratio (ref 0.0–5.0)
Cholesterol, Total: 120 mg/dL (ref 100–199)
HDL: 29 mg/dL — ABNORMAL LOW (ref >39)
LDL Chol Calc (NIH): 47 mg/dL (ref 0–99)
Triglycerides: 286 mg/dL — ABNORMAL HIGH (ref 0–149)
VLDL Cholesterol Cal: 44 mg/dL — ABNORMAL HIGH (ref 5–40)

## 2022-06-29 ENCOUNTER — Ambulatory Visit: Payer: 59 | Admitting: Internal Medicine

## 2022-06-30 ENCOUNTER — Other Ambulatory Visit: Payer: Self-pay | Admitting: Internal Medicine

## 2022-06-30 ENCOUNTER — Other Ambulatory Visit (HOSPITAL_COMMUNITY): Payer: Self-pay

## 2022-06-30 MED ORDER — ATORVASTATIN CALCIUM 80 MG PO TABS
80.0000 mg | ORAL_TABLET | Freq: Every day | ORAL | 1 refills | Status: DC
  Filled 2022-06-30: qty 30, 30d supply, fill #0
  Filled 2022-07-27: qty 30, 30d supply, fill #1
  Filled 2022-08-27: qty 30, 30d supply, fill #2
  Filled 2022-09-24: qty 30, 30d supply, fill #3
  Filled 2022-10-19: qty 30, 30d supply, fill #4
  Filled 2022-11-16: qty 30, 30d supply, fill #5

## 2022-07-01 ENCOUNTER — Other Ambulatory Visit (HOSPITAL_COMMUNITY): Payer: Self-pay

## 2022-07-27 ENCOUNTER — Other Ambulatory Visit: Payer: Self-pay

## 2022-07-27 ENCOUNTER — Other Ambulatory Visit (HOSPITAL_COMMUNITY): Payer: Self-pay

## 2022-08-27 ENCOUNTER — Other Ambulatory Visit: Payer: Self-pay | Admitting: Cardiovascular Disease

## 2022-08-29 ENCOUNTER — Other Ambulatory Visit (HOSPITAL_COMMUNITY): Payer: Self-pay

## 2022-08-29 ENCOUNTER — Other Ambulatory Visit: Payer: Self-pay

## 2022-08-29 MED ORDER — PANTOPRAZOLE SODIUM 40 MG PO TBEC
40.0000 mg | DELAYED_RELEASE_TABLET | Freq: Two times a day (BID) | ORAL | 1 refills | Status: DC
  Filled 2022-08-29: qty 60, 30d supply, fill #0
  Filled 2022-09-24: qty 60, 30d supply, fill #1
  Filled 2022-10-19: qty 60, 30d supply, fill #2
  Filled 2022-11-16: qty 60, 30d supply, fill #3
  Filled 2022-12-19: qty 60, 30d supply, fill #4
  Filled 2023-01-18: qty 60, 30d supply, fill #5

## 2022-08-29 MED ORDER — FENOFIBRATE 160 MG PO TABS
160.0000 mg | ORAL_TABLET | Freq: Every day | ORAL | 1 refills | Status: DC
  Filled 2022-08-29: qty 30, 30d supply, fill #0
  Filled 2022-09-24: qty 30, 30d supply, fill #1
  Filled 2022-10-19: qty 30, 30d supply, fill #2
  Filled 2022-11-16: qty 30, 30d supply, fill #3
  Filled 2022-12-19: qty 30, 30d supply, fill #4
  Filled 2023-01-18: qty 30, 30d supply, fill #5

## 2022-09-01 ENCOUNTER — Encounter (HOSPITAL_COMMUNITY): Payer: Self-pay

## 2022-09-01 ENCOUNTER — Other Ambulatory Visit (HOSPITAL_COMMUNITY): Payer: Self-pay

## 2022-09-26 ENCOUNTER — Other Ambulatory Visit: Payer: Self-pay

## 2022-09-27 ENCOUNTER — Other Ambulatory Visit (HOSPITAL_COMMUNITY): Payer: Self-pay

## 2022-10-18 ENCOUNTER — Encounter: Payer: Self-pay | Admitting: Internal Medicine

## 2022-10-18 ENCOUNTER — Other Ambulatory Visit (HOSPITAL_COMMUNITY): Payer: Self-pay

## 2022-10-18 ENCOUNTER — Other Ambulatory Visit: Payer: Self-pay

## 2022-10-18 ENCOUNTER — Ambulatory Visit (INDEPENDENT_AMBULATORY_CARE_PROVIDER_SITE_OTHER): Payer: 59 | Admitting: Internal Medicine

## 2022-10-18 VITALS — BP 120/70 | HR 84 | Ht 69.0 in | Wt 203.6 lb

## 2022-10-18 DIAGNOSIS — E1169 Type 2 diabetes mellitus with other specified complication: Secondary | ICD-10-CM

## 2022-10-18 DIAGNOSIS — E785 Hyperlipidemia, unspecified: Secondary | ICD-10-CM

## 2022-10-18 DIAGNOSIS — Z7985 Long-term (current) use of injectable non-insulin antidiabetic drugs: Secondary | ICD-10-CM | POA: Diagnosis not present

## 2022-10-18 DIAGNOSIS — E6609 Other obesity due to excess calories: Secondary | ICD-10-CM | POA: Diagnosis not present

## 2022-10-18 DIAGNOSIS — Z7984 Long term (current) use of oral hypoglycemic drugs: Secondary | ICD-10-CM | POA: Diagnosis not present

## 2022-10-18 DIAGNOSIS — Z6832 Body mass index (BMI) 32.0-32.9, adult: Secondary | ICD-10-CM

## 2022-10-18 LAB — POCT GLYCOSYLATED HEMOGLOBIN (HGB A1C): Hemoglobin A1C: 7.3 % — AB (ref 4.0–5.6)

## 2022-10-18 MED ORDER — MOUNJARO 10 MG/0.5ML ~~LOC~~ SOAJ
10.0000 mg | SUBCUTANEOUS | 3 refills | Status: DC
  Filled 2022-10-18: qty 2, 28d supply, fill #0
  Filled 2022-11-16: qty 2, 28d supply, fill #1
  Filled 2022-12-19 (×2): qty 2, 28d supply, fill #2
  Filled 2023-01-18: qty 2, 28d supply, fill #3
  Filled 2023-02-11: qty 2, 28d supply, fill #4
  Filled 2023-03-13 – 2023-03-14 (×2): qty 6, 84d supply, fill #5
  Filled 2023-07-27: qty 6, 84d supply, fill #6

## 2022-10-18 MED ORDER — FREESTYLE LIBRE 3 SENSOR MISC
1.0000 | 3 refills | Status: DC
Start: 2022-10-18 — End: 2023-11-17
  Filled 2022-10-18: qty 2, 28d supply, fill #0

## 2022-10-18 MED ORDER — JARDIANCE 25 MG PO TABS
25.0000 mg | ORAL_TABLET | Freq: Every day | ORAL | 3 refills | Status: DC
  Filled 2022-10-18: qty 30, 30d supply, fill #0
  Filled 2022-11-16: qty 30, 30d supply, fill #1
  Filled 2022-12-19: qty 30, 30d supply, fill #2
  Filled 2023-01-18: qty 30, 30d supply, fill #3
  Filled 2023-02-11: qty 30, 30d supply, fill #4
  Filled 2023-03-13 – 2023-03-14 (×2): qty 90, 90d supply, fill #5
  Filled 2023-07-27: qty 90, 90d supply, fill #6

## 2022-10-18 MED ORDER — METFORMIN HCL ER 500 MG PO TB24
2000.0000 mg | ORAL_TABLET | Freq: Every day | ORAL | 3 refills | Status: DC
  Filled 2022-10-18: qty 120, 30d supply, fill #0
  Filled 2023-01-18: qty 120, 30d supply, fill #1
  Filled 2023-02-11: qty 120, 30d supply, fill #2
  Filled 2023-03-13 – 2023-03-14 (×2): qty 360, 90d supply, fill #3
  Filled 2023-07-27: qty 360, 90d supply, fill #4

## 2022-10-18 NOTE — Patient Instructions (Addendum)
Please continue: - Metformin ER 1500-2000 mg in a.m. - Jardiance 25 mg daily in am - Glipizide 5 mg 15-30 min before a larger meal  Decrease: - Mounjaro 10 mg weekly  Resume the [place] 3 sensor.  Please return in 3-4 months.

## 2022-10-18 NOTE — Progress Notes (Unsigned)
Patient ID: Peter Keller, male   DOB: 06/17/1975, 47 y.o.   MRN: [id number]  HPI: Peter Keller is a 47 y.o.-year-old male, returning for follow-up for DM2, dx in 04/2013 (A1c was 11%), non-insulin-dependent, uncontrolled, with long term complications (CAD - s/p drug-eluting stent).  Last visit 8 months ago.  Interim history: No increased urination, blurry vision, nausea, chest pain. He has decreased appetite and significant acid reflux from the Mounjaro 12.5 mg weekly. He also resumed drinking alcohol and he is planning to start an Ayurvedic treatment for this soon. He came off the sensor and is not checking blood sugars right now. He is working from home and not exercising consistently at the moment.  DM2: Reviewed HbA1c levels: Lab Results  Component Value Date   HGBA1C 6.6 (A) 02/23/2022   HGBA1C 6.3 (A) 10/20/2021   HGBA1C 6.1 06/18/2021   He is on: - Metformin 1000 mg 2x a day with meals - may forget the dose at night >> metformin ER 2000 >> 1500-2000 mg >> 1500 mg in the a.m.  - Mounjaro 5 >> 10 mg >> 10/12.5 weekly - Jardiance 25 mg daily - Glipizide 5 mg before a larger dinner  -he seldom has to take this Patient was initially on Janumet extended-release, which was subsequently stopped.  Amaryl was added 07/2013, but then switched to Invokana 100 mg daily in 09/2013 because of weight gain. Trulicity 0.75 mg weekly was added in 11/2013 for help with weight loss >> increased to 1.5 mg weekly in 01/2014 >> increased to 3 mg weekly and 01/2019. Previously on metformin extended-release - started  in 03/2014 Previously on [place].  Now on Jardiance per insurance preference  He is not checking blood sugars now.  Previously:  Previously: - am: 140-150 - 2h after b'fast: n/c - lunch: n/c - 2h after lunch: 160 - dinner: n/c - 2h after dinner: 160 - bedtime: n/c  Previously:   Lowest sugar was >70 >> 60s >> n/c; it is unclear at which CBG level he has  hypoglycemia awareness Highest sugar was 220 >> 260s >> n/c.  Glucometer: One Touch Verio IQ.   He was exercising regularly at the gym >>  exerciseing with personal trainer at home >> not now.  -No CKD, last BUN/creatinine:  Lab Results  Component Value Date   BUN 18 02/23/2022   CREATININE 0.98 02/23/2022   Lab Results  Component Value Date   MICRALBCREAT 1.1 03/17/2021   MICRALBCREAT 1.4 05/28/2019   MICRALBCREAT 7.2 01/30/2017   MICRALBCREAT 1.6 04/07/2015   -+ HL;  last set of lipids: Lab Results  Component Value Date   CHOL 120 06/10/2022   HDL 29 (L) 06/10/2022   LDLCALC 47 06/10/2022   LDLDIRECT 74.0 02/23/2022   TRIG 286 (H) 06/10/2022   CHOLHDL 4.1 06/10/2022  On Lipitor 20 >> 40 >> 80.  We restarted fenofibrate at 160 mg daily.  He was started on Vascepa 2 g twice a day, but this was causing diarrhea >> we stopped this.  Cardiology added Zetia 10 mg daily.  - last eye exam was 03/10/2021: No DR reportedly. Dr. Fabian Keller.  -No numbness and tingling in his feet.  Last foot exam 10/20/2021.  She also has a history of HTN. When I saw him in 01/2017, he just went skiing and forgot BP meds: drank 2 cups of coffee >> HR 125-145 and BP when he returned 213/105.  He was started on blood pressure medication then. He also  has a history of GERD.  ROS: + see HPI  I reviewed pt's medications, allergies, PMH, social hx, family hx, and changes were documented in the history of present illness. Otherwise, unchanged from my initial visit note.  Past Medical History:  Diagnosis Date   Alcohol dependence ([facility])    Diabetes ([facility])    GERD (gastroesophageal reflux disease)    Hepatic steatosis    Hyperlipidemia    Hypertension    Obesity    Sinus tachycardia    Past Surgical History:  Procedure Laterality Date   CORONARY STENT INTERVENTION N/A 07/24/2020   Procedure: CORONARY STENT INTERVENTION;  Surgeon: Kathleene Hazel, MD;  Location: [facility];  Service:  Cardiovascular;  Laterality: N/A;   CORONARY ULTRASOUND/IVUS N/A 07/24/2020   Procedure: Intravascular Ultrasound/IVUS;  Surgeon: Kathleene Hazel, MD;  Location: [facility];  Service: Cardiovascular;  Laterality: N/A;   LEFT HEART CATH AND CORONARY ANGIOGRAPHY N/A 07/24/2020   Procedure: LEFT HEART CATH AND CORONARY ANGIOGRAPHY;  Surgeon: Kathleene Hazel, MD;  Location: [facility];  Service: Cardiovascular;  Laterality: N/A;   none     History   Social History   Marital Status: Married    Spouse Name: N/A   Number of Children: 2   Occupational History    Nurse, adult    Social History Main Topics   Smoking status: Never Smoker    Smokeless tobacco: Never Used   Alcohol Use: Yes     Comment: 3 alcoholic drinks daily   Drug Use: No   Current Outpatient Medications on File Prior to Visit  Medication Sig Dispense Refill   atorvastatin (LIPITOR) 80 MG tablet Take 1 tablet (80 mg total) by mouth daily. 90 tablet 1   Cholecalciferol (VITAMIN D) 400 UNITS capsule Take 500 Units by mouth daily.     clopidogrel (PLAVIX) 75 MG tablet Take 1 tablet (75 mg total) by mouth daily. 90 tablet 2   Continuous Blood Gluc Sensor (FREESTYLE LIBRE 3 SENSOR) MISC Change every 14 days as directed. 6 each 3   ezetimibe (ZETIA) 10 MG tablet Take 1 tablet (10 mg total) by mouth daily. 90 tablet 3   fenofibrate 160 MG tablet Take 1 tablet (160 mg total) by mouth daily. 90 tablet 1   isosorbide mononitrate (IMDUR) 30 MG 24 hr tablet Take 1 tablet (30 mg total) by mouth daily. 90 tablet 3   JARDIANCE 25 MG TABS tablet Take 1 tablet (25 mg total) by mouth daily. 90 tablet 3   losartan-hydrochlorothiazide (HYZAAR) 100-25 MG tablet Take 1 tablet by mouth daily. 90 tablet 3   metFORMIN (GLUCOPHAGE-XR) 500 MG 24 hr tablet Take 4 tablets (2,000 mg total) by mouth daily with breakfast. 360 tablet 3   metoprolol succinate (TOPROL XL) 50 MG 24 hr tablet Take 3 tablets (150 mg total)  by mouth daily. Take with or immediately following a meal. 270 tablet 3   nitroGLYCERIN (NITROSTAT) 0.4 MG SL tablet Place 1 tablet under the tongue every 5 minutes for 3 doses as needed for chest pain 25 tablet 3   pantoprazole (PROTONIX) 40 MG tablet Take 1 tablet (40 mg total) by mouth 2 (two) times daily. 180 tablet 1   tirzepatide (MOUNJARO) 10 MG/0.5ML Pen Inject 10 mg into the skin once a week. 6 mL 3   tirzepatide (MOUNJARO) 12.5 MG/0.5ML Pen Inject 12.5 mg into the skin once a week. 2 mL 5   No current facility-administered medications  on file prior to visit.   No Known Allergies Family History  Problem Relation Age of Onset   Hypertension Mother    Diabetes Mother    Heart disease Paternal Uncle    Heart attack Paternal Grandmother    Colon cancer Neg Hx    Esophageal cancer Neg Hx    Stomach cancer Neg Hx    PE: There were no vitals taken for this visit. Wt Readings from Last 3 Encounters:  10/18/22 203 lb 9.6 oz (92.4 kg)  03/23/22 199 lb 9.6 oz (90.5 kg)  02/23/22 197 lb 6.4 oz (89.5 kg)   Constitutional: overweight, in NAD Eyes: no exophthalmos ENT: no masses palpated in neck, no cervical lymphadenopathy Cardiovascular: tachycardia, RR, No MRG Respiratory: CTA B Musculoskeletal: no deformities Skin: no rashes Neurological: no tremor with outstretched hands Diabetic Foot Exam - Simple   Simple Foot Form Diabetic Foot exam was performed with the following findings: Yes 10/18/2022  2:46 PM  Visual Inspection No deformities, no ulcerations, no other skin breakdown bilaterally: Yes Sensation Testing Intact to touch and monofilament testing bilaterally: Yes Pulse Check Posterior Tibialis and Dorsalis pulse intact bilaterally: Yes Comments    ASSESSMENT: 1. DM2, non-insulin-dependent, uncontrolled, with complications: - CAD, s/p drug-eluting stent 07/2020  2. Hypertriglyceridemia  3.  Obesity class I  - Right upper quadrant ultrasound from 08/07/2013 shows  probable fatty infiltration, confirmed on the abdominal MRI 08/13/2013 - AST> ALT, consistent with excessive alcohol use.  He stopped drinking alcohol several times in the past, but restarted afterwards.  PLAN:  1. Patient with history of uncontrolled type 2 diabetes, improved in 2022 after adjusting his diabetic regimen, using a CGM, and improving diet.  HbA1c decreased to 6.3%, but an HbA1c was higher at last visit, at 6.6%.  At last visit, sugars are higher within the target range, with occasional higher blood sugars after breakfast and dinner.  This could have been related to being in vacation prior to the appt. we did not increase the Mounjaro dose at that time as I did not feel that this was necessary especially as he was also planning to restart exercise, going to the gym.  Of note, sugars were excellent when he was not exercising consistently before.  Since then, we had to increase the dose of Mounjaro due to the lack of availability of the 10 mg dose at the pharmacy.  -At today's visit, he describes that after he takes the Mounjaro 12.5 mg dose, he has significantly decreased appetite for the next 2 days, but then he has building up of acid in his stomach and has to vomit.  We discussed about backing off the dose to the 10 mg dose, which she was tolerating well.  He agrees with this.  If the 10 mg dose is not available at the pharmacy, we will need to send a 7.5 mg dose. -He describes that he is still drinking alcohol and admits he has an addiction to it.  Greggory Keen is not helping much with this.  He is planning to obtain an Ayurvedic medicine from [place] to help with this. -Also, he is not exercising consistently now.  We discussed that this really helped his blood sugars and weight in the future.  He is considering going back to the gym. -Lastly, he is not checking blood sugars, now off the [place] 3 sensor.  I strongly advised him to restart this. - I suggested to:  Patient Instructions  Please  continue: - Metformin ER 1500-2000  mg in a.m. - Jardiance 25 mg daily in am - Glipizide 5 mg 15-30 min before a larger meal  Decrease: - Mounjaro 10 mg weekly  Resume the [place] 3 sensor.  Please return in 3-4 months.  - we checked his HbA1c: 7.3% (higher) - advised to check sugars at different times of the day - 4x a day, rotating check times - advised for yearly eye exams >> he is not UTD - he would like to have annual labs today. - return to clinic in 3-4 months  2. Hypertriglyceridemia -Reviewed latest lipid panel from 05/2022: LDL much improved, HDL slightly low, triglycerides still elevated: Lab Results  Component Value Date   CHOL 120 06/10/2022   HDL 29 (L) 06/10/2022   LDLCALC 47 06/10/2022   LDLDIRECT 74.0 02/23/2022   TRIG 286 (H) 06/10/2022   CHOLHDL 4.1 06/10/2022  -He continues Lipitor 80 mg daily and Tricor 160 mg daily.  Also Zetia 10 mg daily.  No side effects.  He could not tolerate Vascepa due to diarrhea in the past. -Discussed about working on reducing alcohol and improving diet -He would like to have the lipid panel repeated at today's visit  3.  Overweight -continue SGLT 2 inhibitor and GLP-1/GIP receptor agonist which should also help with weight loss -At last visit, weight was stable, previously lost 13 pounds before the prior 3 visits combined -He gained 6 pounds since last visit  Carlus Pavlov, MD PhD [facility] Endocrinology

## 2022-10-19 LAB — COMPREHENSIVE METABOLIC PANEL
ALT: 29 U/L (ref 0–53)
AST: 38 U/L — ABNORMAL HIGH (ref 0–37)
Albumin: 4.3 g/dL (ref 3.5–5.2)
Alkaline Phosphatase: 40 U/L (ref 39–117)
BUN: 13 mg/dL (ref 6–23)
CO2: 26 meq/L (ref 19–32)
Calcium: 9.5 mg/dL (ref 8.4–10.5)
Chloride: 101 meq/L (ref 96–112)
Creatinine, Ser: 0.87 mg/dL (ref 0.40–1.50)
GFR: 103.12 mL/min (ref >60.00)
Glucose, Bld: 163 mg/dL — ABNORMAL HIGH (ref 70–99)
Potassium: 3.6 meq/L (ref 3.5–5.1)
Sodium: 138 meq/L (ref 135–145)
Total Bilirubin: 1 mg/dL (ref 0.2–1.2)
Total Protein: 7.5 g/dL (ref 6.0–8.3)

## 2022-10-19 LAB — LIPID PANEL
Cholesterol: 110 mg/dL (ref 0–200)
HDL: 33 mg/dL — ABNORMAL LOW (ref >39.00)
LDL Cholesterol: 37 mg/dL (ref 0–99)
NonHDL: 77.35
Total CHOL/HDL Ratio: 3
Triglycerides: 201 mg/dL — ABNORMAL HIGH (ref 0.0–149.0)
VLDL: 40.2 mg/dL — ABNORMAL HIGH (ref 0.0–40.0)

## 2022-10-19 LAB — MICROALBUMIN / CREATININE URINE RATIO
Creatinine,U: 52.2 mg/dL
Microalb Creat Ratio: 1.3 mg/g (ref 0.0–30.0)
Microalb, Ur: <0.7 mg/dL (ref 0.0–1.9)

## 2022-10-27 ENCOUNTER — Other Ambulatory Visit (HOSPITAL_COMMUNITY): Payer: Self-pay

## 2022-10-28 NOTE — Addendum Note (Signed)
Addended by: Pollie Meyer on: 10/28/2022 09:18 AM   Modules accepted: Orders

## 2022-11-16 ENCOUNTER — Other Ambulatory Visit: Payer: Self-pay | Admitting: Cardiovascular Disease

## 2022-11-16 ENCOUNTER — Other Ambulatory Visit: Payer: Self-pay

## 2022-11-17 ENCOUNTER — Other Ambulatory Visit (HOSPITAL_COMMUNITY): Payer: Self-pay

## 2022-11-17 MED ORDER — METOPROLOL SUCCINATE ER 50 MG PO TB24
150.0000 mg | ORAL_TABLET | Freq: Every day | ORAL | 0 refills | Status: DC
  Filled 2022-11-17: qty 90, 30d supply, fill #0

## 2022-11-17 MED ORDER — CLOPIDOGREL BISULFATE 75 MG PO TABS
75.0000 mg | ORAL_TABLET | Freq: Every day | ORAL | 0 refills | Status: DC
  Filled 2022-11-17: qty 30, 30d supply, fill #0

## 2022-11-17 NOTE — Telephone Encounter (Signed)
Last visit with Dr. Elease Hashimoto on 12/08/21 with plan to f/u in 6 months.  No f/u appt

## 2022-12-19 ENCOUNTER — Other Ambulatory Visit: Payer: Self-pay | Admitting: Internal Medicine

## 2022-12-19 ENCOUNTER — Other Ambulatory Visit: Payer: Self-pay | Admitting: Cardiovascular Disease

## 2022-12-19 ENCOUNTER — Other Ambulatory Visit: Payer: Self-pay

## 2022-12-19 ENCOUNTER — Other Ambulatory Visit (HOSPITAL_COMMUNITY): Payer: Self-pay

## 2022-12-19 DIAGNOSIS — E785 Hyperlipidemia, unspecified: Secondary | ICD-10-CM

## 2022-12-19 DIAGNOSIS — Z79899 Other long term (current) drug therapy: Secondary | ICD-10-CM

## 2022-12-19 MED ORDER — ATORVASTATIN CALCIUM 80 MG PO TABS
80.0000 mg | ORAL_TABLET | Freq: Every day | ORAL | 1 refills | Status: DC
  Filled 2022-12-19 (×2): qty 30, 30d supply, fill #0
  Filled 2023-01-18: qty 30, 30d supply, fill #1
  Filled 2023-02-11: qty 30, 30d supply, fill #2
  Filled 2023-03-13: qty 30, 30d supply, fill #3
  Filled 2023-03-14: qty 90, 90d supply, fill #3

## 2022-12-19 MED ORDER — CLOPIDOGREL BISULFATE 75 MG PO TABS
75.0000 mg | ORAL_TABLET | Freq: Every day | ORAL | 0 refills | Status: DC
  Filled 2022-12-19: qty 30, 30d supply, fill #0

## 2022-12-19 MED ORDER — EZETIMIBE 10 MG PO TABS
10.0000 mg | ORAL_TABLET | Freq: Every day | ORAL | 3 refills | Status: DC
  Filled 2022-12-19: qty 30, 30d supply, fill #0
  Filled 2023-01-18: qty 30, 30d supply, fill #1
  Filled 2023-02-11: qty 30, 30d supply, fill #2
  Filled 2023-03-13: qty 30, 30d supply, fill #3
  Filled 2023-03-14: qty 90, 90d supply, fill #3
  Filled 2023-07-27: qty 90, 90d supply, fill #4

## 2022-12-19 MED ORDER — METOPROLOL SUCCINATE ER 50 MG PO TB24
150.0000 mg | ORAL_TABLET | Freq: Every day | ORAL | 0 refills | Status: DC
  Filled 2022-12-19 (×2): qty 90, 30d supply, fill #0

## 2022-12-20 ENCOUNTER — Other Ambulatory Visit (HOSPITAL_COMMUNITY): Payer: Self-pay

## 2022-12-20 ENCOUNTER — Other Ambulatory Visit: Payer: Self-pay

## 2022-12-20 MED ORDER — NITROGLYCERIN 0.4 MG SL SUBL
SUBLINGUAL_TABLET | SUBLINGUAL | 1 refills | Status: AC
  Filled 2022-12-20: qty 25, 5d supply, fill #0

## 2023-01-18 ENCOUNTER — Other Ambulatory Visit: Payer: Self-pay | Admitting: Internal Medicine

## 2023-01-18 ENCOUNTER — Other Ambulatory Visit: Payer: Self-pay

## 2023-01-19 ENCOUNTER — Other Ambulatory Visit (HOSPITAL_COMMUNITY): Payer: Self-pay

## 2023-01-20 ENCOUNTER — Other Ambulatory Visit (HOSPITAL_COMMUNITY): Payer: Self-pay

## 2023-01-20 MED ORDER — CLOPIDOGREL BISULFATE 75 MG PO TABS
75.0000 mg | ORAL_TABLET | Freq: Every day | ORAL | 0 refills | Status: DC
  Filled 2023-01-20: qty 30, 30d supply, fill #0

## 2023-01-20 MED ORDER — METOPROLOL SUCCINATE ER 50 MG PO TB24
150.0000 mg | ORAL_TABLET | Freq: Every day | ORAL | 0 refills | Status: DC
  Filled 2023-01-20: qty 90, 30d supply, fill #0

## 2023-02-11 ENCOUNTER — Other Ambulatory Visit: Payer: Self-pay | Admitting: Cardiovascular Disease

## 2023-02-11 ENCOUNTER — Other Ambulatory Visit: Payer: Self-pay | Admitting: Internal Medicine

## 2023-02-12 ENCOUNTER — Other Ambulatory Visit (HOSPITAL_COMMUNITY): Payer: Self-pay

## 2023-02-13 ENCOUNTER — Other Ambulatory Visit (HOSPITAL_COMMUNITY): Payer: Self-pay

## 2023-02-13 ENCOUNTER — Other Ambulatory Visit: Payer: Self-pay

## 2023-02-13 MED ORDER — PANTOPRAZOLE SODIUM 40 MG PO TBEC
40.0000 mg | DELAYED_RELEASE_TABLET | Freq: Two times a day (BID) | ORAL | 1 refills | Status: DC
  Filled 2023-02-13: qty 60, 30d supply, fill #0
  Filled 2023-03-13 – 2023-03-14 (×2): qty 180, 90d supply, fill #1
  Filled 2023-07-27: qty 60, 30d supply, fill #2
  Filled 2023-11-01: qty 60, 30d supply, fill #3

## 2023-02-13 MED ORDER — FENOFIBRATE 160 MG PO TABS
160.0000 mg | ORAL_TABLET | Freq: Every day | ORAL | 1 refills | Status: DC
  Filled 2023-02-13: qty 30, 30d supply, fill #0
  Filled 2023-03-13: qty 30, 30d supply, fill #1
  Filled 2023-03-14: qty 90, 90d supply, fill #1
  Filled 2023-07-27: qty 30, 30d supply, fill #2
  Filled 2023-11-01: qty 30, 30d supply, fill #3

## 2023-02-13 MED ORDER — METOPROLOL SUCCINATE ER 50 MG PO TB24
150.0000 mg | ORAL_TABLET | Freq: Every day | ORAL | 3 refills | Status: DC
  Filled 2023-02-13: qty 90, 30d supply, fill #0
  Filled 2023-03-13 – 2023-03-14 (×2): qty 270, 90d supply, fill #1

## 2023-02-13 MED ORDER — CLOPIDOGREL BISULFATE 75 MG PO TABS
75.0000 mg | ORAL_TABLET | Freq: Every day | ORAL | 3 refills | Status: DC
  Filled 2023-02-13: qty 30, 30d supply, fill #0
  Filled 2023-03-13: qty 30, 30d supply, fill #1
  Filled 2023-03-14: qty 90, 90d supply, fill #1
  Filled 2023-07-27: qty 90, 90d supply, fill #2
  Filled 2023-11-01: qty 90, 90d supply, fill #3

## 2023-02-21 ENCOUNTER — Ambulatory Visit: Payer: 59 | Admitting: Internal Medicine

## 2023-03-07 ENCOUNTER — Encounter (HOSPITAL_BASED_OUTPATIENT_CLINIC_OR_DEPARTMENT_OTHER): Payer: Self-pay

## 2023-03-13 ENCOUNTER — Other Ambulatory Visit: Payer: Self-pay | Admitting: Cardiovascular Disease

## 2023-03-14 ENCOUNTER — Telehealth: Payer: Self-pay

## 2023-03-14 ENCOUNTER — Other Ambulatory Visit (HOSPITAL_COMMUNITY): Payer: Self-pay

## 2023-03-14 ENCOUNTER — Ambulatory Visit: Payer: Managed Care, Other (non HMO) | Admitting: Internal Medicine

## 2023-03-14 ENCOUNTER — Encounter: Payer: Self-pay | Admitting: Internal Medicine

## 2023-03-14 VITALS — BP 120/70 | HR 88 | Ht 69.0 in | Wt 191.8 lb

## 2023-03-14 DIAGNOSIS — E66811 Obesity, class 1: Secondary | ICD-10-CM | POA: Diagnosis not present

## 2023-03-14 DIAGNOSIS — Z7984 Long term (current) use of oral hypoglycemic drugs: Secondary | ICD-10-CM | POA: Diagnosis not present

## 2023-03-14 DIAGNOSIS — Z7985 Long-term (current) use of injectable non-insulin antidiabetic drugs: Secondary | ICD-10-CM

## 2023-03-14 DIAGNOSIS — E6609 Other obesity due to excess calories: Secondary | ICD-10-CM

## 2023-03-14 DIAGNOSIS — E785 Hyperlipidemia, unspecified: Secondary | ICD-10-CM

## 2023-03-14 DIAGNOSIS — Z6832 Body mass index (BMI) 32.0-32.9, adult: Secondary | ICD-10-CM

## 2023-03-14 DIAGNOSIS — E1169 Type 2 diabetes mellitus with other specified complication: Secondary | ICD-10-CM | POA: Diagnosis not present

## 2023-03-14 LAB — POCT GLYCOSYLATED HEMOGLOBIN (HGB A1C): Hemoglobin A1C: 6.6 % — AB (ref 4.0–5.6)

## 2023-03-14 NOTE — Telephone Encounter (Signed)
Pt needs PA for Mounjaro 10 mg

## 2023-03-14 NOTE — Progress Notes (Signed)
Patient ID: Peter Keller, male   DOB: Sep 09, 1975, 48 y.o.   MRN: [id number]  HPI: Peter Keller is a 48 y.o.-year-old male, returning for follow-up for DM2, dx in 04/2013 (A1c was 11%), non-insulin-dependent, uncontrolled, with long term complications (CAD - s/p drug-eluting stent).  Last visit 4.5 months ago.  Interim history: No increased urination, blurry vision, nausea, chest pain. He had decreased appetite and significant acid reflux from the Mounjaro 12.5 mg weekly. Now on the 10 mg weekly.  He still has acid reflux and has tried a PPI without success.  He occasionally has to vomit to eliminate the acid from his stomach. Since last visit, he has increased his green tea consumption and is trying to use this to replace alcohol.  Since he is doing this, he started to lose weight (12 pounds since last visit) and feels that Greggory Keen is reducing his appetite more. He stopped eating bagels in the morning.  Usually has coffee and then has a brunch.  DM2: Reviewed HbA1c levels: Lab Results  Component Value Date   HGBA1C 7.3 (A) 10/18/2022   HGBA1C 6.6 (A) 02/23/2022   HGBA1C 6.3 (A) 10/20/2021   He is on: - Metformin 1000 mg 2x a day with meals - may forget the dose at night >> metformin ER 2000 >> 1500-2000 mg >> 1000-1500 mg in the a.m.  - Mounjaro 5 >> 10 mg >> 10 >> 12.5 >> 10 weekly - Jardiance 25 mg daily -   -he did not have to take this Patient was initially on Janumet extended-release, which was subsequently stopped.  Amaryl was added 07/2013, but then switched to Invokana 100 mg daily in 09/2013 because of weight gain. Trulicity 0.75 mg weekly was added in 11/2013 for help with weight loss >> increased to 1.5 mg weekly in 01/2014 >> increased to 3 mg weekly and 01/2019. Previously on metformin extended-release - started  in 03/2014 Previously on [place].  Now on Jardiance per insurance preference  At last visit he was not checking blood sugars at all.  Then restarted the  freestyle libre 3 CGM - now off: - am: 100 - 1h later: 130s - 2h after lunch: 170-180 - dinner: ? - 2h after dinner: ? - bedtime: ?  Previously:  Previously: - am: 140-150 - 2h after b'fast: n/c - lunch: n/c - 2h after lunch: 160 - dinner: n/c - 2h after dinner: 160 - bedtime: n/c  Previously:   Lowest sugar was >70 >> 60s >> n/c >> 70; it is unclear at which CBG level he has hypoglycemia awareness Highest sugar was 220 >> 260s >> n/c >> 200.  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   He was exercising regularly at the gym >>  exerciseing with personal trainer at home >> not now.  -No CKD, last BUN/creatinine:  Lab Results  Component Value Date   BUN 13 10/18/2022   CREATININE 0.87 10/18/2022   Lab Results  Component Value Date   MICRALBCREAT 1.3 10/18/2022   MICRALBCREAT 1.1 03/17/2021   MICRALBCREAT 1.4 05/28/2019   MICRALBCREAT 7.2 01/30/2017   MICRALBCREAT 1.6 04/07/2015   -+ HL;  last set of lipids: Lab Results  Component Value Date   CHOL 110 10/18/2022   HDL 33.00 (L) 10/18/2022   LDLCALC 37 10/18/2022   LDLDIRECT 74.0 02/23/2022   TRIG 201.0 (H) 10/18/2022   CHOLHDL 3 10/18/2022  On Lipitor 20 >> 40 >> 80.  We restarted fenofibrate at 160 mg daily.  He was  started on Vascepa 2 g twice a day, but this was causing diarrhea >> we stopped this.  Cardiology added Zetia 10 mg daily.  - last eye exam was 04/12/2022: No DR reportedly. Dr. Fabian Sharp.  -No numbness and tingling in his feet.  Last foot exam 10/18/2022.  She also has a history of HTN. When I saw him in 01/2017, he just went skiing and forgot BP meds: drank 2 cups of coffee >> HR 125-145 and BP when he returned 213/105.  He was started on blood pressure medication then. He also has a history of GERD.  ROS: + see HPI  I reviewed pt's medications, allergies, PMH, social hx, family hx, and changes were documented in the history of present illness. Otherwise, unchanged from my initial visit note.  Past  Medical History:  Diagnosis Date   Alcohol dependence (HCC)    Diabetes (HCC)    GERD (gastroesophageal reflux disease)    Hepatic steatosis    Hyperlipidemia    Hypertension    Obesity    Sinus tachycardia    Past Surgical History:  Procedure Laterality Date   CORONARY STENT INTERVENTION N/A 07/24/2020   Procedure: CORONARY STENT INTERVENTION;  Surgeon: Kathleene Hazel, MD;  [facility]: [facility];  Service: Cardiovascular;  Laterality: N/A;   CORONARY ULTRASOUND/IVUS N/A 07/24/2020   Procedure: Intravascular Ultrasound/IVUS;  Surgeon: Kathleene Hazel, MD;  Location: [facility];  Service: Cardiovascular;  Laterality: N/A;   LEFT HEART CATH AND CORONARY ANGIOGRAPHY N/A 07/24/2020   Procedure: LEFT HEART CATH AND CORONARY ANGIOGRAPHY;  Surgeon: Kathleene Hazel, MD;  Location: [facility];  Service: Cardiovascular;  Laterality: N/A;   none     History   Social History   Marital Status: Married    Spouse Name: N/A   Number of Children: 2   Occupational History    Nurse, adult    Social History Main Topics   Smoking status: Never Smoker    Smokeless tobacco: Never Used   Alcohol Use: Yes     Comment: 3 alcoholic drinks daily   Drug Use: No   Current Outpatient Medications on File Prior to Visit  Medication Sig Dispense Refill   atorvastatin (LIPITOR) 80 MG tablet Take 1 tablet (80 mg total) by mouth daily. 90 tablet 1   Cholecalciferol (VITAMIN D) 400 UNITS capsule Take 500 Units by mouth daily.     clopidogrel (PLAVIX) 75 MG tablet Take 1 tablet (75 mg total) by mouth daily. 90 tablet 3   Continuous Glucose Sensor (FREESTYLE LIBRE 3 SENSOR) MISC Change every 14 days as directed. 6 each 3   ezetimibe (ZETIA) 10 MG tablet Take 1 tablet (10 mg total) by mouth daily. 90 tablet 3   fenofibrate 160 MG tablet Take 1 tablet (160 mg total) by mouth daily. 90 tablet 1   isosorbide mononitrate (IMDUR) 30 MG 24 hr tablet Take 1 tablet (30  mg total) by mouth daily. 90 tablet 3   JARDIANCE 25 MG TABS tablet Take 1 tablet (25 mg total) by mouth daily. 90 tablet 3   losartan-hydrochlorothiazide (HYZAAR) 100-25 MG tablet Take 1 tablet by mouth daily. 90 tablet 3   metFORMIN (GLUCOPHAGE-XR) 500 MG 24 hr tablet Take 4 tablets (2,000 mg total) by mouth daily with breakfast. 360 tablet 3   metoprolol succinate (TOPROL XL) 50 MG 24 hr tablet Take 3 tablets (150 mg total) by mouth daily. Take with or immediately following a meal 90  tablet 3   nitroGLYCERIN (NITROSTAT) 0.4 MG SL tablet Place 1 tablet under the tongue every 5 minutes for 3 doses as needed for chest pain 25 tablet 1   pantoprazole (PROTONIX) 40 MG tablet Take 1 tablet (40 mg total) by mouth 2 (two) times daily. 180 tablet 1   tirzepatide (MOUNJARO) 10 MG/0.5ML Pen Inject 10 mg into the skin once a week. 6 mL 3   No current facility-administered medications on file prior to visit.   No Known Allergies Family History  Problem Relation Age of Onset   Hypertension Mother    Diabetes Mother    Heart disease Paternal Uncle    Heart attack Paternal Grandmother    Colon cancer Neg Hx    Esophageal cancer Neg Hx    Stomach cancer Neg Hx    PE: BP 120/70   Pulse 88   Ht 5\' 9"  (1.753 m)   Wt 191 lb 12.8 oz (87 kg)   SpO2 98%   BMI 28.32 kg/m  Wt Readings from Last 3 Encounters:  03/14/23 191 lb 12.8 oz (87 kg)  10/18/22 203 lb 9.6 oz (92.4 kg)  03/23/22 199 lb 9.6 oz (90.5 kg)   Constitutional: overweight, in NAD Eyes: no exophthalmos ENT: no masses palpated in neck, no cervical lymphadenopathy Cardiovascular: RRR, No MRG Respiratory: CTA B Musculoskeletal: no deformities Skin: no rashes Neurological: no tremor with outstretched hands  ASSESSMENT: 1. DM2, non-insulin-dependent, uncontrolled, with complications: - CAD, s/p drug-eluting stent 07/2020  2. Hypertriglyceridemia  3.  Obesity class I  - Right upper quadrant ultrasound from 08/07/2013 shows  probable fatty infiltration, confirmed on the abdominal MRI 08/13/2013 - AST> ALT, consistent with excessive alcohol use.  He continues to try to stop. Component     Latest Ref Rng 02/23/2022 06/10/2022 10/18/2022  Total Bilirubin     0.2 - 1.2 mg/dL 0.9   1.0   Alkaline Phosphatase     39 - 117 U/L 51   40   AST     0 - 37 U/L 81 (H)   38 (H)   ALT     0 - 53 U/L 56 (H)   29    PLAN:  1. Patient with history of uncontrolled type 2 diabetes, improved in 2022 after adjusting his diabetic regimen using the CGM and improving diet.  At that time HbA1c increased to 6.3% but then started to increase.  At last visit, this was 7.3%, higher.  At that time, expressed his wish to decrease the dose of Mounjaro to see if his acid reflux would be improve and also to be able to enjoy food.  We decreased the dose from 12.5 mg to 10 mg weekly.  We continued metformin, SGLT2 inhibitor and sulfonylurea.  He was still drinking alcohol and admitted he did have an addiction to it.  We discussed Greggory Keen can also help with this but he did not feel that it reduced his craving.  He was planning to obtain an Ayurvedic medicine from [place] to help with this.  Also, he was planning to restart exercise.  He was off the freestyle libre 3 sensor and I advised him to restart.  At that time, he was not checking sugars at all.  Now checking occasionally. -At today's visit, I does not have the sensor as she does not feel that he needs it anymore.  Sugars appear to be better controlled per his report, at goal in the morning, but they increase by approximately 30  mg/dL after waking up without him eating anything currently.  After lunch, sugars are higher, 170-180 and is not checking consistently later in the day. -He describes acid reflux with Mounjaro and I recommended to try Mylanta.  If this is not helping, we could reduce his Mounjaro dose further, to 7.5 mg weekly.  I advised him to let me know. -He has not been taking glipizide since  last visit.  Will continue without it. -He is doing a better job with alcohol, did not complete.  But every 6 months he is trying to take 1 month off and started to replace some of the alcohol with green tea.  I encouraged him to continue to try to replace all of the alcohol with greatly, if possible. -I advised him to: Patient Instructions  Please continue: - Metformin ER 1000-1500 mg in a.m. - Jardiance 25 mg daily in am - Mounjaro 10 mg weekly  Try Mylanta when you have the acid reflux.  Please return in 4 months.  - we checked his HbA1c: 6.6% (lower) - advised to check sugars at different times of the day - 4x a day, rotating check times - advised for yearly eye exams >> he is UTD - return to clinic in 3-4 months  2. Hypertriglyceridemia -Latest lipid panel was reviewed from last visit: LDL at goal, triglycerides elevated, HDL slightly low: Lab Results  Component Value Date   CHOL 110 10/18/2022   HDL 33.00 (L) 10/18/2022   LDLCALC 37 10/18/2022   LDLDIRECT 74.0 02/23/2022   TRIG 201.0 (H) 10/18/2022   CHOLHDL 3 10/18/2022  -He continues on Lipitor 80 mg daily, Tricor 160 mg daily, Zetia 10 mg daily.  No side effects.  He could not tolerate Vascepa due to diarrhea in the past. -Reducing alcohol will also greatly help improving his triglycerides.  3.  Overweight -continue SGLT 2 inhibitor and GLP-1/GIP receptor agonist which should also help with weight loss.  At last visit, we had to reduce the dose of Mounjaro due to lack of appetite and acid reflux symptoms. -He gained 6 pounds before last visit - he lost 12 lbs since last OV: reduced alcohol, improved sensitivity to Roosvelt Harps, MD PhD [facility]

## 2023-03-14 NOTE — Patient Instructions (Addendum)
Please continue: - Metformin ER 1000-1500 mg in a.m. - Jardiance 25 mg daily in am - Mounjaro 10 mg weekly  Try Mylanta when you have the acid reflux.  Please return in 4 months.

## 2023-03-15 ENCOUNTER — Other Ambulatory Visit (HOSPITAL_COMMUNITY): Payer: Self-pay

## 2023-03-15 ENCOUNTER — Other Ambulatory Visit: Payer: Self-pay

## 2023-03-15 ENCOUNTER — Telehealth: Payer: Self-pay

## 2023-03-15 MED ORDER — LOSARTAN POTASSIUM-HCTZ 100-25 MG PO TABS
1.0000 | ORAL_TABLET | Freq: Every day | ORAL | 0 refills | Status: DC
  Filled 2023-03-15: qty 90, 90d supply, fill #0

## 2023-03-15 NOTE — Telephone Encounter (Signed)
Pharmacy Patient Advocate Encounter   Received notification from Pt Calls Messages that prior authorization for Mounjaro 10mg  is required/requested.   Insurance verification completed.   The patient is insured through [employer] .   Per test claim: PA required; PA started via CoverMyMeds. KEY [id number] . Waiting for clinical questions to populate.

## 2023-03-21 ENCOUNTER — Ambulatory Visit (HOSPITAL_BASED_OUTPATIENT_CLINIC_OR_DEPARTMENT_OTHER): Payer: Managed Care, Other (non HMO) | Admitting: Cardiology

## 2023-03-21 ENCOUNTER — Encounter (HOSPITAL_BASED_OUTPATIENT_CLINIC_OR_DEPARTMENT_OTHER): Payer: Self-pay | Admitting: Cardiology

## 2023-03-21 VITALS — BP 126/76 | HR 95 | Ht 69.0 in | Wt 189.9 lb

## 2023-03-21 DIAGNOSIS — E1169 Type 2 diabetes mellitus with other specified complication: Secondary | ICD-10-CM

## 2023-03-21 DIAGNOSIS — E785 Hyperlipidemia, unspecified: Secondary | ICD-10-CM | POA: Diagnosis not present

## 2023-03-21 DIAGNOSIS — I10 Essential (primary) hypertension: Secondary | ICD-10-CM

## 2023-03-21 DIAGNOSIS — I2510 Atherosclerotic heart disease of native coronary artery without angina pectoris: Secondary | ICD-10-CM | POA: Diagnosis not present

## 2023-03-21 MED ORDER — ASPIRIN 81 MG PO TBEC: 81.0000 mg | DELAYED_RELEASE_TABLET | Freq: Every day | ORAL | Status: DC

## 2023-03-21 NOTE — Progress Notes (Signed)
 Cardiology Office Note:    Date:  03/21/2023   ID:  Peter Keller, DOB Oct 05, 1975, MRN [id number]  PCP:  Rollene Almarie LABOR, MD  Cardiologist:  Aleene Passe, MD  Electrophysiologist:  None  Referring MD: Rollene Almarie LABOR, *   Chief Complaint  Patient presents with   Coronary Artery Disease    History of Present Illness:    Peter Keller is a 48 y.o. male with a hx of CAD status post stenting, hypertension, hyperlipidemia who presents for follow-up.  Previously followed with Dr. Passe, last seen 11/2021.  Coronary CTA 07/23/2020 showed severe mid LAD stenosis, calcium  score 60 (92nd percentile).  Cath on 07/24/2020 showed severe proximal LAD stenosis status post DES, mild nonobstructive disease in ramus and distal LAD.  Echocardiogram 07/24/2020 showed EF 60 to 65%, normal RV function, no significant valvular disease.  Reported chest pain and underwent Lexiscan  Myoview  03/2022 which showed normal perfusion, EF 71%.  Since last clinic visit, he reports he is doing well.  Does report some dyspnea with exertion but denies any chest pain.  He has lost 15 pounds over last 6 months.  Reports has had recent nosebleeds.    Past Medical History:  Diagnosis Date   Alcohol dependence (HCC)    Diabetes (HCC)    GERD (gastroesophageal reflux disease)    Hepatic steatosis    Hyperlipidemia    Hypertension    Obesity    Sinus tachycardia     Past Surgical History:  Procedure Laterality Date   CORONARY STENT INTERVENTION N/A 07/24/2020   Procedure: CORONARY STENT INTERVENTION;  Surgeon: Verlin Lonni BIRCH, MD;  Location: [facility];  Service: Cardiovascular;  Laterality: N/A;   CORONARY ULTRASOUND/IVUS N/A 07/24/2020   Procedure: Intravascular Ultrasound/IVUS;  Surgeon: Verlin Lonni BIRCH, MD;  Location: [facility];  Service: Cardiovascular;  Laterality: N/A;   LEFT HEART CATH AND CORONARY ANGIOGRAPHY N/A 07/24/2020   Procedure: LEFT HEART CATH AND CORONARY  ANGIOGRAPHY;  Surgeon: Verlin Lonni BIRCH, MD;  Location: [facility];  Service: Cardiovascular;  Laterality: N/A;   none      Current Medications: Current Meds  Medication Sig   aspirin  EC 81 MG tablet Take 1 tablet (81 mg total) by mouth daily. Swallow whole.   atorvastatin  (LIPITOR) 80 MG tablet Take 1 tablet (80 mg total) by mouth daily.   Cholecalciferol (VITAMIN D ) 400 UNITS capsule Take 500 Units by mouth daily.   clopidogrel  (PLAVIX ) 75 MG tablet Take 1 tablet (75 mg total) by mouth daily.   Continuous Glucose Sensor (FREESTYLE LIBRE 3 SENSOR) MISC Change every 14 days as directed.   ezetimibe  (ZETIA ) 10 MG tablet Take 1 tablet (10 mg total) by mouth daily.   fenofibrate  160 MG tablet Take 1 tablet (160 mg total) by mouth daily.   JARDIANCE  25 MG TABS tablet Take 1 tablet (25 mg total) by mouth daily.   losartan -hydrochlorothiazide  (HYZAAR ) 100-25 MG tablet Take 1 tablet by mouth daily.   metFORMIN  (GLUCOPHAGE -XR) 500 MG 24 hr tablet Take 4 tablets (2,000 mg total) by mouth daily with breakfast.   metoprolol  succinate (TOPROL  XL) 50 MG 24 hr tablet Take 3 tablets (150 mg total) by mouth daily. Take with or immediately following a meal   nitroGLYCERIN  (NITROSTAT ) 0.4 MG SL tablet Place 1 tablet under the tongue every 5 minutes for 3 doses as needed for chest pain   pantoprazole  (PROTONIX ) 40 MG tablet Take 1 tablet (40 mg total) by mouth 2 (two)  times daily.   tirzepatide  (MOUNJARO ) 10 MG/0.5ML Pen Inject 10 mg into the skin once a week.   [DISCONTINUED] isosorbide  mononitrate (IMDUR ) 30 MG 24 hr tablet Take 1 tablet (30 mg total) by mouth daily.     Allergies:   Patient has no known allergies.   Social History   Socioeconomic History   Marital status: Married    Spouse name: Not on file   Number of children: Not on file   Years of education: Not on file   Highest education level: Not on file  Occupational History   Not on file  Tobacco Use   Smoking status:  Never   Smokeless tobacco: Never  Substance and Sexual Activity   Alcohol use: Yes    Comment: 3 alcoholic drinks daily   Drug use: No   Sexual activity: Not on file  Other Topics Concern   Not on file  Social History Narrative   Not on file   Social Drivers of Health   Financial Resource Strain: Not on file  Food Insecurity: Not on file  Transportation Needs: Not on file  Physical Activity: Not on file  Stress: Not on file  Social Connections: Not on file     Family History: The patient's family history includes Diabetes in his mother; Heart attack in his paternal grandmother; Heart disease in his paternal uncle; Hypertension in his mother. There is no history of Colon cancer, Esophageal cancer, or Stomach cancer.  ROS:   Please see the history of present illness.     All other systems reviewed and are negative.  EKGs/Labs/Other Studies Reviewed:    The following studies were reviewed today:   EKG:  EKG is not ordered today.    Recent Labs: 10/18/2022: ALT 29; BUN 13; Creatinine, Ser 0.87; Potassium 3.6; Sodium 138  Recent Lipid Panel    Component Value Date/Time   CHOL 110 10/18/2022 1500   CHOL 120 06/10/2022 1321   TRIG 201.0 (H) 10/18/2022 1500   HDL 33.00 (L) 10/18/2022 1500   HDL 29 (L) 06/10/2022 1321   CHOLHDL 3 10/18/2022 1500   VLDL 40.2 (H) 10/18/2022 1500   LDLCALC 37 10/18/2022 1500   LDLCALC 47 06/10/2022 1321   LDLDIRECT 74.0 02/23/2022 1043    Physical Exam:    VS:  BP 126/76 (BP Location: Left Arm, Patient Position: Sitting)   Pulse 95   Ht 5' 9 (1.753 m)   Wt 189 lb 14.4 oz (86.1 kg)   SpO2 98%   BMI 28.04 kg/m     Wt Readings from Last 3 Encounters:  03/21/23 189 lb 14.4 oz (86.1 kg)  03/14/23 191 lb 12.8 oz (87 kg)  10/18/22 203 lb 9.6 oz (92.4 kg)     GEN:  Well nourished, well developed in no acute distress HEENT: Normal NECK: No JVD; No carotid bruits CARDIAC: RRR, no murmurs, rubs, gallops RESPIRATORY:  Clear to  auscultation without rales, wheezing or rhonchi  ABDOMEN: Soft, non-tender, non-distended MUSCULOSKELETAL:  No edema; No deformity  SKIN: Warm and dry NEUROLOGIC:  Alert and oriented x 3 PSYCHIATRIC:  Normal affect   ASSESSMENT:    1. Coronary artery disease involving native coronary artery of native heart, unspecified whether angina present   2. Essential hypertension, benign   3. Hyperlipidemia associated with type 2 diabetes mellitus (HCC)    PLAN:    CAD: Coronary CTA 07/23/2020 showed severe mid LAD stenosis, calcium  score 60 (92nd percentile).  Cath on 07/24/2020 showed severe mid  LAD stenosis status post DES, mild nonobstructive disease in ramus and distal LAD.  Echocardiogram 07/24/2020 showed EF 60 to 65%, normal RV function, no significant valvular disease.  Reported chest pain and underwent Lexiscan  Myoview  03/2022 which showed normal perfusion, EF 71%. -On Plavix  75 mg daily.  Has been having some epistaxis, will switch to aspirin  81 mg daily -Continue atorvastatin  80 mg daily and Zetia  10 mg daily -Continue Toprol -XL 150 mg daily  Hypertension: On Toprol -XL 150 mg daily, losartan -HCTZ 100-25 mg daily.  Appears controlled  Hyperlipidemia: On atorvastatin  80 mg daily and Zetia  10 mg daily.  LDL 37 on 10/18/2022  T2DM: On Jardiance , metformin , Mounjaro .  A1c 6.6% on 03/14/2023  Obesity: Body mass index is 28.04 kg/m. On Mounjaro .  Has lost 15 pounds in last 6 months  RTC in 6 months  Medication Adjustments/Labs and Tests Ordered: Current medicines are reviewed at length with the patient today.  Concerns regarding medicines are outlined above.  No orders of the defined types were placed in this encounter.  Meds ordered this encounter  Medications   aspirin  EC 81 MG tablet    Sig: Take 1 tablet (81 mg total) by mouth daily. Swallow whole.    Patient Instructions  Medication Instructions:  Your physician has recommended you make the following change in your medication:    STOP Imdur  STOP Plavix  START Asprin 81 mg (if 2 week trial goes well, please contact office and let us  know!)  *If you need a refill on your cardiac medications before your next appointment, please call your pharmacy*   Follow-Up: At [facility], you and your health needs are our priority.  As part of our continuing mission to provide you with exceptional heart care, we have created designated Provider Care Teams.  These Care Teams include your primary Cardiologist (physician) and Advanced Practice Providers (APPs -  Physician Assistants and Nurse Practitioners) who all work together to provide you with the care you need, when you need it.  We recommend signing up for the patient portal called MyChart.  Sign up information is provided on this After Visit Summary.  MyChart is used to connect with patients for Virtual Visits (Telemedicine).  Patients are able to view lab/test results, encounter notes, upcoming appointments, etc.  Non-urgent messages can be sent to your provider as well.   To learn more about what you can do with MyChart, go to [email or web address].    Your next appointment:   6 month(s)  Provider:   Dr. Kate    Signed, Lonni LITTIE Kate, MD  03/21/2023 12:45 PM    [facility]

## 2023-03-21 NOTE — Patient Instructions (Signed)
 Medication Instructions:  Your physician has recommended you make the following change in your medication:   STOP Imdur  STOP Plavix  START Asprin 81 mg (if 2 week trial goes well, please contact office and let us  know!)  *If you need a refill on your cardiac medications before your next appointment, please call your pharmacy*   Follow-Up: At [facility], you and your health needs are our priority.  As part of our continuing mission to provide you with exceptional heart care, we have created designated Provider Care Teams.  These Care Teams include your primary Cardiologist (physician) and Advanced Practice Providers (APPs -  Physician Assistants and Nurse Practitioners) who all work together to provide you with the care you need, when you need it.  We recommend signing up for the patient portal called MyChart.  Sign up information is provided on this After Visit Summary.  MyChart is used to connect with patients for Virtual Visits (Telemedicine).  Patients are able to view lab/test results, encounter notes, upcoming appointments, etc.  Non-urgent messages can be sent to your provider as well.   To learn more about what you can do with MyChart, go to [email or web address].    Your next appointment:   6 month(s)  Provider:   Dr. Kate

## 2023-04-25 LAB — HM DIABETES EYE EXAM

## 2023-06-21 ENCOUNTER — Telehealth: Payer: Self-pay | Admitting: Gastroenterology

## 2023-06-21 ENCOUNTER — Telehealth: Payer: Self-pay

## 2023-06-21 NOTE — Telephone Encounter (Signed)
 [facility] Pre-operative Risk Assessment     Request for surgical clearance:     Endoscopy Procedure  What type of surgery is being performed?     Colonoscopy  When is this surgery scheduled?     TBD  What type of clearance is required ?   Pharmacy/ Cardiac  Are there any medications that need to be held prior to surgery and how long? Plavix  5 days  Practice name and name of physician performing surgery?      [facility]/ Dr. Lajuan Pila  What is your office phone and fax number?      Phone- [phone]  Fax- [phone]  Anesthesia type (None, local, MAC, general) ?       MAC   Please route your response to Cristobal Donning RN

## 2023-06-21 NOTE — Telephone Encounter (Signed)
 Cardiac/ Pharmacy clearance sent to pt cardiologist.

## 2023-06-21 NOTE — Telephone Encounter (Signed)
   Primary Cardiologist: Wendie Hamburg, MD  Chart reviewed as part of pre-operative protocol coverage. Given past medical history and time since last visit, based on ACC/AHA guidelines, [facility] Keller would be at acceptable risk for the planned procedure without further cardiovascular testing.   Patient was advised that if he develops new symptoms prior to surgery to contact our office to arrange a follow-up appointment.  He verbalized understanding.  Per office protocol, he may hold Plavix  for 5 days prior to procedure and should resume as soon as hemodynamically stable postoperatively. Ideally aspirin  should be continued without interruption, however if the bleeding risk is too great, aspirin  may be held for 5-7 days prior to surgery. Please resume aspirin  post operatively when it is felt to be safe from a bleeding standpoint.   I will route this recommendation to the requesting party via Epic fax function and remove from pre-op pool.  Please call with questions.  Gerldine Koch, NP-C  06/21/2023, 4:42 PM [address] [place], [place] [address] Office [phone] Fax [phone]

## 2023-06-21 NOTE — Telephone Encounter (Signed)
 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  For screening colonoscopy  Patient is on aspirin /Plavix  Saw cardiology recently.  Verbal ok to proceed with colonoscopy   Plan: - Screening colonoscopy after holding Plavix  5 days prior, can continue aspirin .  Would need cardiology clearance  from Leandrew Proctor, MD  - Landon Pinion, can schedule directly with me. Any 7:30 spot -once cleared by cardiology.  Also has to hold Mounjaro  7 days prior.   RG

## 2023-06-23 ENCOUNTER — Other Ambulatory Visit: Payer: Self-pay

## 2023-06-23 ENCOUNTER — Other Ambulatory Visit (HOSPITAL_COMMUNITY): Payer: Self-pay

## 2023-06-23 DIAGNOSIS — Z1211 Encounter for screening for malignant neoplasm of colon: Secondary | ICD-10-CM

## 2023-06-23 MED ORDER — NA SULFATE-K SULFATE-MG SULF 17.5-3.13-1.6 GM/177ML PO SOLN
1.0000 | Freq: Once | ORAL | 0 refills | Status: AC
  Filled 2023-06-23: qty 354, 1d supply, fill #0

## 2023-06-23 NOTE — Telephone Encounter (Signed)
 Pt made aware of recent recommendations from Dr. Venice Gillis. Clearance was received.  Pt was scheduled for a Colonoscopy on( 07/28/2023 ) Per pt request.  Pt start time to be at 0730 per Dr. Venice Gillis. Cleared through [facility] with Abe Abed. Pt to arrive at 0700. Pt made aware.  Ambulatory referral to GI placed in Epic, Prep sent to pt pharmacy. Prep instructions were sent to pt via my chart. Pt made aware  Pt made aware of the need to hold Plavix  5 days prior to the procedure and the need to hold the Mounjaro  7 days prior to the procedure. Pt was scheduled for a previsit appointment on 07/07/2023 at 2:00 PM.  Pt made aware Pt verbalized understanding with all questions answered.

## 2023-06-27 NOTE — Telephone Encounter (Addendum)
 Pt made aware to hold the plavix  5 days prior to his procedure Pt verbalized understanding with all questions answered.

## 2023-07-04 ENCOUNTER — Other Ambulatory Visit (HOSPITAL_COMMUNITY): Payer: Self-pay

## 2023-07-07 ENCOUNTER — Other Ambulatory Visit: Payer: Self-pay

## 2023-07-07 ENCOUNTER — Other Ambulatory Visit (HOSPITAL_COMMUNITY): Payer: Self-pay

## 2023-07-07 ENCOUNTER — Ambulatory Visit (AMBULATORY_SURGERY_CENTER)

## 2023-07-07 VITALS — Ht 69.0 in | Wt 185.0 lb

## 2023-07-07 DIAGNOSIS — Z1211 Encounter for screening for malignant neoplasm of colon: Secondary | ICD-10-CM

## 2023-07-07 MED ORDER — NA SULFATE-K SULFATE-MG SULF 17.5-3.13-1.6 GM/177ML PO SOLN
1.0000 | Freq: Once | ORAL | 0 refills | Status: AC
  Filled 2023-07-07: qty 354, 2d supply, fill #0

## 2023-07-07 NOTE — Progress Notes (Signed)
 Denies allergies to eggs or soy products. Denies complication of anesthesia or sedation. Denies use of weight loss medication. Denies use of O2.   Emmi instructions given for colonoscopy.

## 2023-07-12 ENCOUNTER — Encounter: Payer: Self-pay | Admitting: Gastroenterology

## 2023-07-18 ENCOUNTER — Other Ambulatory Visit (HOSPITAL_COMMUNITY): Payer: Self-pay

## 2023-07-21 ENCOUNTER — Encounter: Payer: Self-pay | Admitting: Gastroenterology

## 2023-07-21 IMAGING — MR MR LUMBAR SPINE W/O CM
4 of 5 series · 19 of 48 positions shown · non-contrast
Comparison: Lumbar radiographs 11/29/2017. MRI 12/08/2017.

CLINICAL DATA: 45-year-old male with increased low back pain for 1
month left greater than right with posterior leg pain. No known
injury.

EXAM:
MRI LUMBAR SPINE WITHOUT CONTRAST
TECHNIQUE: Multiplanar, multisequence MR imaging of the lumbar spine was
performed. No intravenous contrast was administered.

[Series 5: T1 · sagittal · 4.0mm · 0.73mm/px · 3 of 15 slices shown (1 of 2)]
[im 3/15]
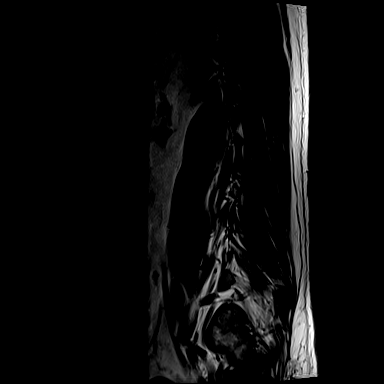
[im 9/15]
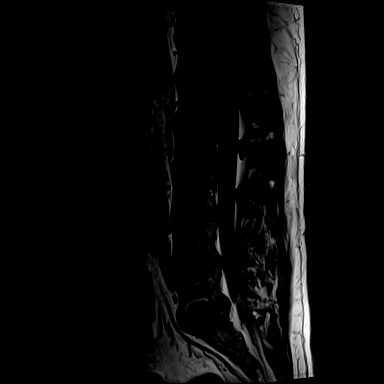
[im 15/15]
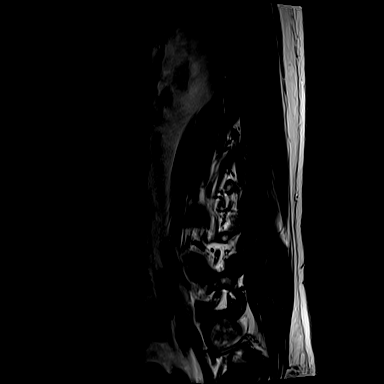

[Series 7: T2 · sagittal · 4.0mm · 0.73mm/px · 6 of 15 slices shown (1 of 2)]
[im 1/15]
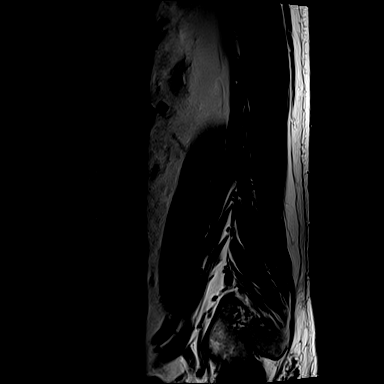
[im 3/15]
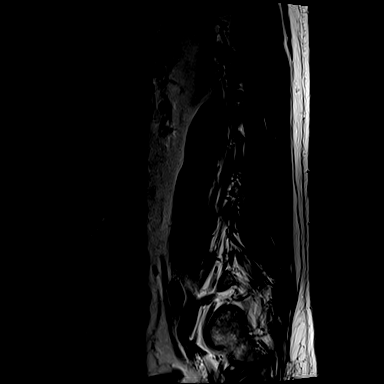
[im 6/15]
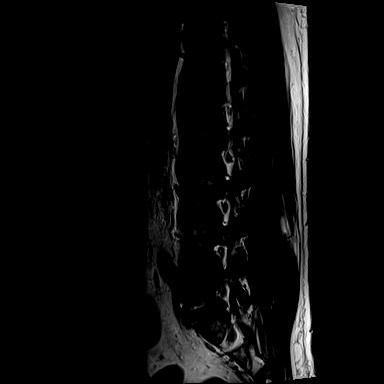
[im 9/15]
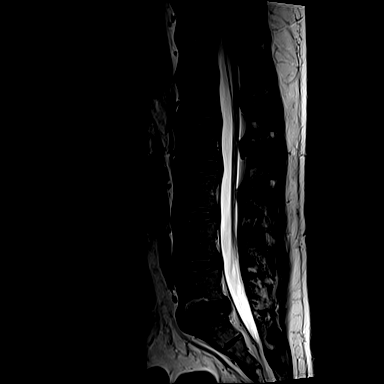
[im 12/15]
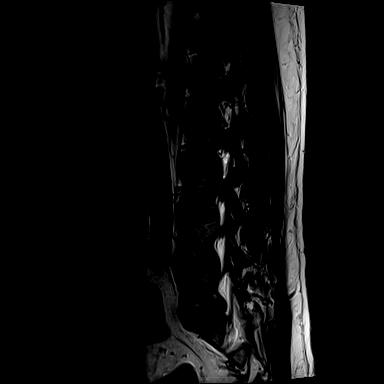
[im 15/15]
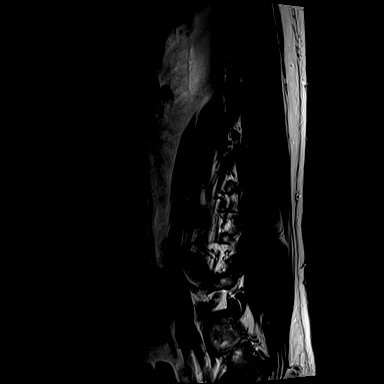

[Series 12: T2 · axial · 4.0mm · 0.28mm/px · z∈[-52,+115]mm · 7 of 39 slices shown (2 of 2)]
[im 1/39]
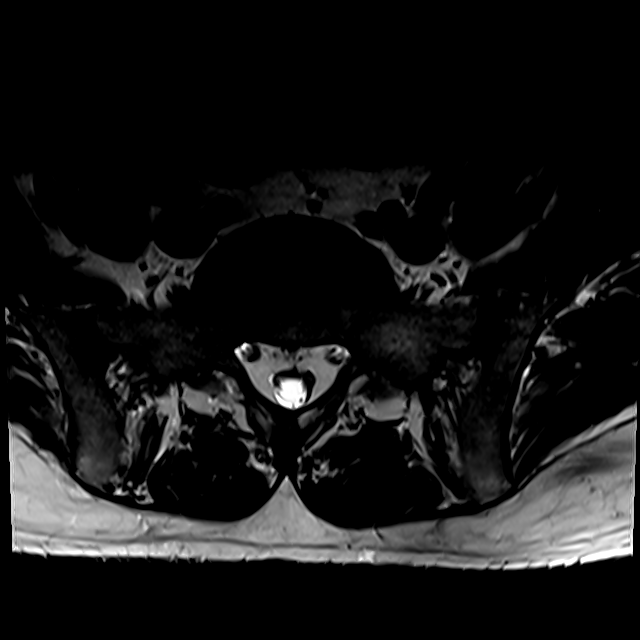
[im 6/39]
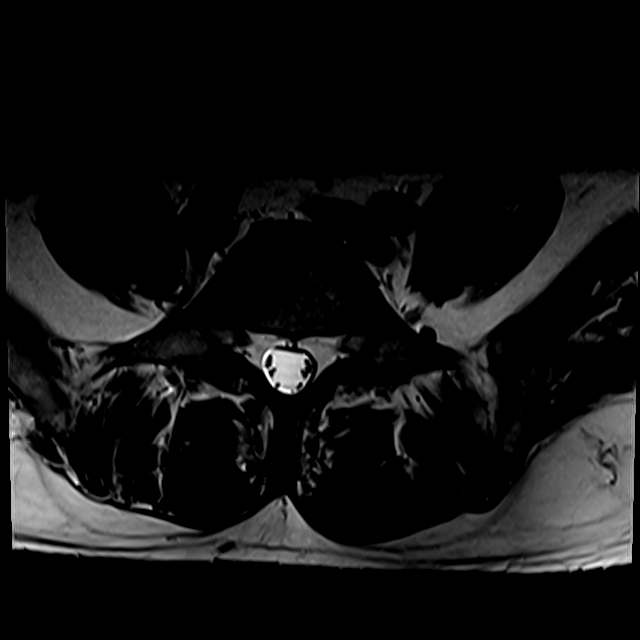
[im 11/39]
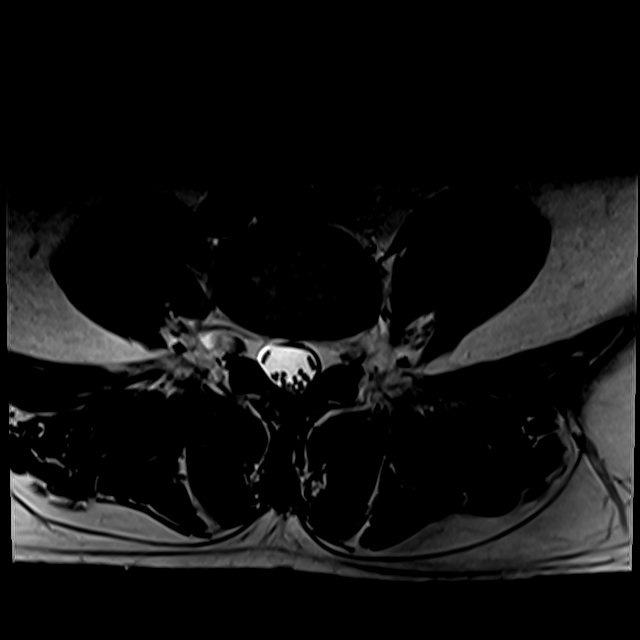
[im 17/39]
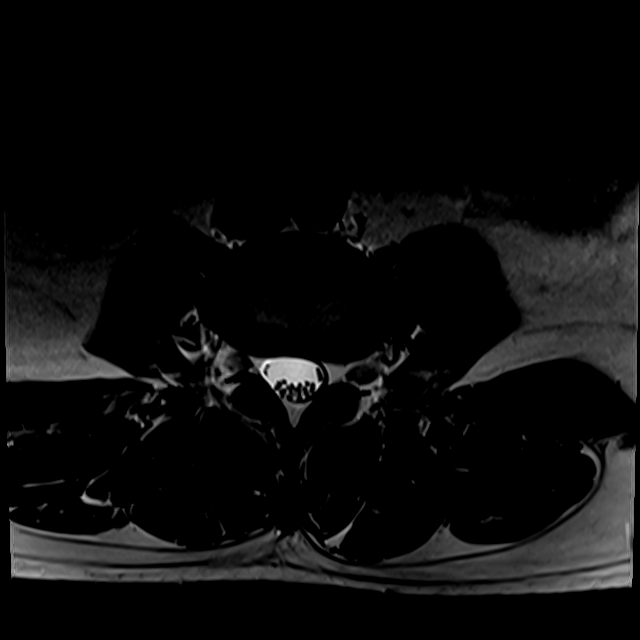
[im 20/39]
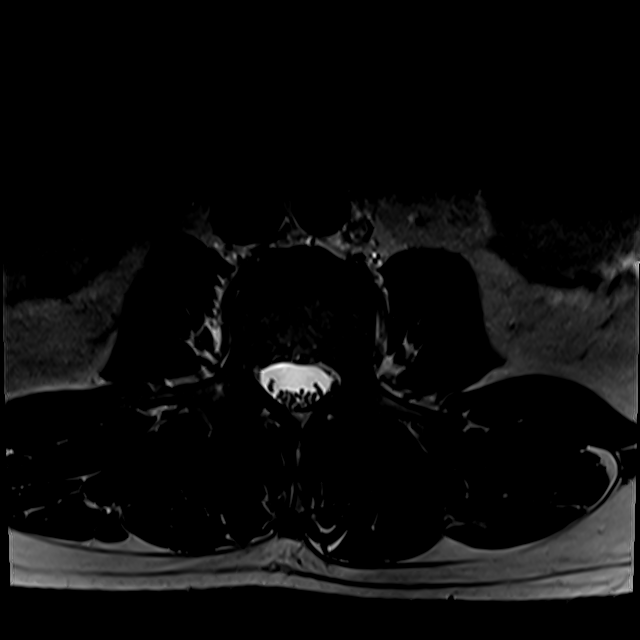
[im 22/39]
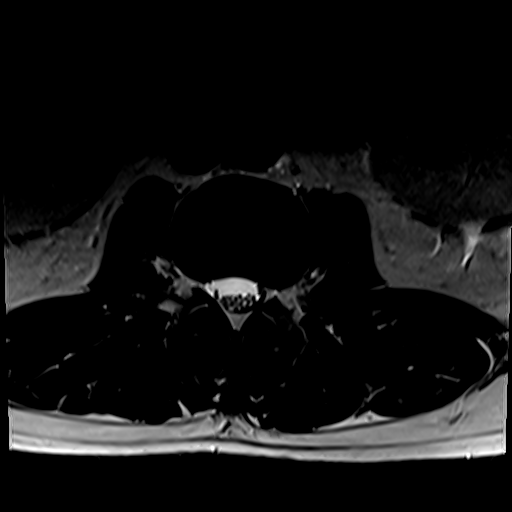
[im 33/39]
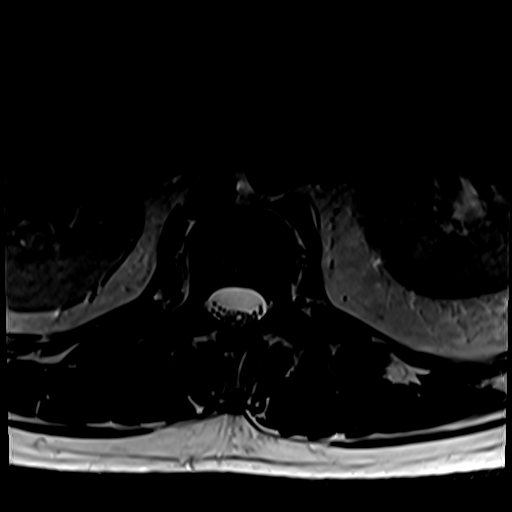

[Series 100: T1 · axial · 4.0mm · 0.28mm/px · z∈[-27,+115]mm · 3 of 39 slices shown (2 of 2)]
[im 6/39]
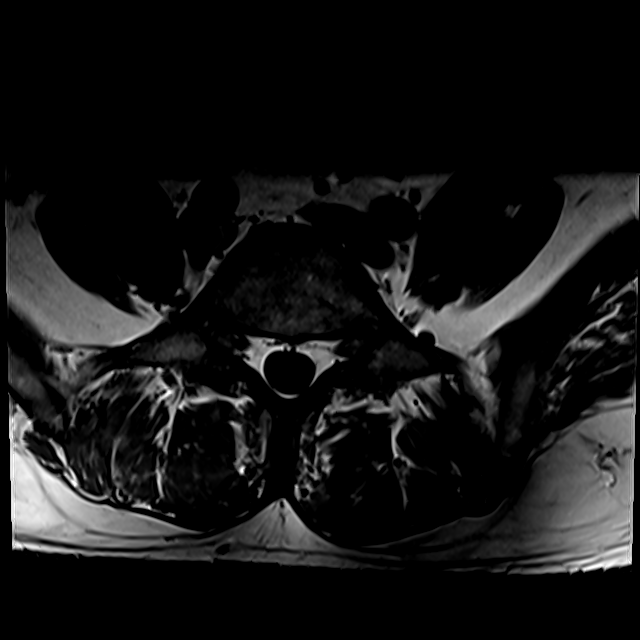
[im 20/39]
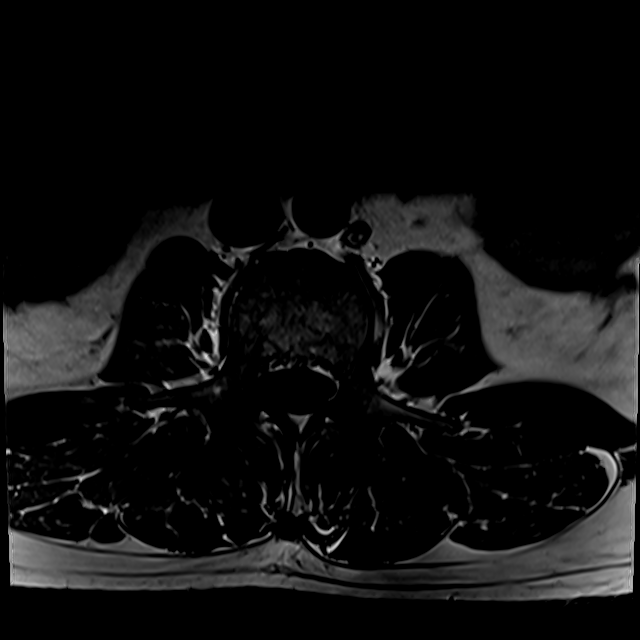
[im 33/39]
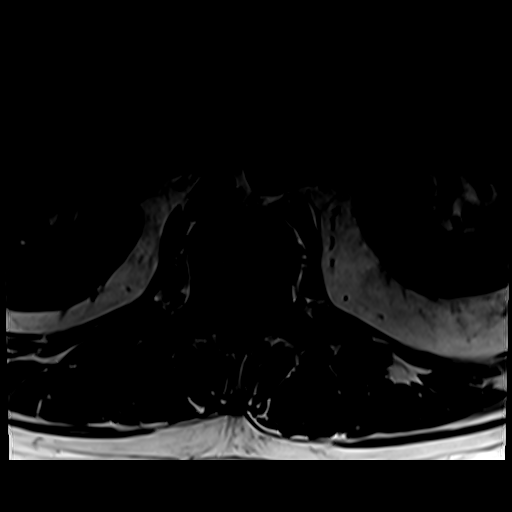

[19 of 48 positions shown; findings below may reference images not displayed]

FINDINGS: Segmentation: Normal on the radiographs, the same numbering system
used in 8941.

Alignment:  Chronic straightening lordosis. No spondylolisthesis.

Vertebrae: Chronic degenerative endplate marrow signal changes at
L5-S1. Background bone marrow signal is within normal limits at 3
[employer] imaging. No marrow edema or evidence of acute osseous
abnormality. Intact visible sacrum.

Conus medullaris and cauda equina: Conus extends to the T12-L1
level. No lower spinal cord or conus signal abnormality. Capacious
spinal canal. Cauda equina nerve roots appear normal.

Paraspinal and other soft tissues: Negative.

Disc levels:

T11-T12: Chronic disc desiccation. Minimal disc bulging. No
stenosis.

T12-L1:  Negative.

L1-L2:  Negative.

L2-L3:  Stable subtle disc bulging. No stenosis.

L3-L4:  Negative.

L4-L5: Subtle far lateral disc bulging. Mild chronic facet
hypertrophy although trace facet joint fluid has regressed since
8941. No stenosis.

L5-S1: Chronic disc desiccation and mild disc bulging, endplate
spurring. A posterior annular fissure of the disc is less apparent.
Stable mild facet hypertrophy and trace facet joint fluid. No spinal
or lateral recess stenosis. And no convincing foraminal stenosis.
IMPRESSION: 1. Essentially stable MRI appearance of the lumbar spine since [DATE]. Chronic disc and endplate degeneration at L5-S1. Mild facet
degeneration there and at L4-L5.
3. Capacious underlying spinal canal. No spinal stenosis, lateral
recess stenosis, or convincing neural impingement.

## 2023-07-26 ENCOUNTER — Emergency Department (HOSPITAL_COMMUNITY)

## 2023-07-26 ENCOUNTER — Encounter (HOSPITAL_COMMUNITY): Payer: Self-pay

## 2023-07-26 ENCOUNTER — Other Ambulatory Visit: Payer: Self-pay

## 2023-07-26 ENCOUNTER — Emergency Department (HOSPITAL_COMMUNITY)
Admission: EM | Admit: 2023-07-26 | Discharge: 2023-07-26 | Disposition: A | Discharge status: Home / Self Care | Attending: Emergency Medicine | Admitting: Emergency Medicine

## 2023-07-26 DIAGNOSIS — Z955 Presence of coronary angioplasty implant and graft: Secondary | ICD-10-CM | POA: Insufficient documentation

## 2023-07-26 DIAGNOSIS — R0789 Other chest pain: Secondary | ICD-10-CM | POA: Insufficient documentation

## 2023-07-26 DIAGNOSIS — I2510 Atherosclerotic heart disease of native coronary artery without angina pectoris: Secondary | ICD-10-CM | POA: Insufficient documentation

## 2023-07-26 DIAGNOSIS — R42 Dizziness and giddiness: Secondary | ICD-10-CM | POA: Insufficient documentation

## 2023-07-26 DIAGNOSIS — I10 Essential (primary) hypertension: Secondary | ICD-10-CM | POA: Insufficient documentation

## 2023-07-26 DIAGNOSIS — Z7902 Long term (current) use of antithrombotics/antiplatelets: Secondary | ICD-10-CM | POA: Insufficient documentation

## 2023-07-26 DIAGNOSIS — Y900 Blood alcohol level of less than 20 mg/100 ml: Secondary | ICD-10-CM | POA: Insufficient documentation

## 2023-07-26 DIAGNOSIS — R2689 Other abnormalities of gait and mobility: Secondary | ICD-10-CM | POA: Insufficient documentation

## 2023-07-26 DIAGNOSIS — Z79899 Other long term (current) drug therapy: Secondary | ICD-10-CM | POA: Insufficient documentation

## 2023-07-26 DIAGNOSIS — Z7984 Long term (current) use of oral hypoglycemic drugs: Secondary | ICD-10-CM | POA: Insufficient documentation

## 2023-07-26 DIAGNOSIS — E119 Type 2 diabetes mellitus without complications: Secondary | ICD-10-CM | POA: Insufficient documentation

## 2023-07-26 LAB — COMPREHENSIVE METABOLIC PANEL WITH GFR
ALT: 44 U/L (ref 0–44)
AST: 104 U/L — ABNORMAL HIGH (ref 15–41)
Albumin: 4.1 g/dL (ref 3.5–5.0)
Alkaline Phosphatase: 41 U/L (ref 38–126)
Anion gap: 15 (ref 5–15)
BUN: 17 mg/dL (ref 6–20)
CO2: 17 mmol/L — ABNORMAL LOW (ref 22–32)
Calcium: 9.4 mg/dL (ref 8.9–10.3)
Chloride: 105 mmol/L (ref 98–111)
Creatinine, Ser: 1.14 mg/dL (ref 0.61–1.24)
GFR, Estimated: >60 mL/min (ref >60)
Glucose, Bld: 240 mg/dL — ABNORMAL HIGH (ref 70–99)
Potassium: 4.2 mmol/L (ref 3.5–5.1)
Sodium: 137 mmol/L (ref 135–145)
Total Bilirubin: 1.3 mg/dL — ABNORMAL HIGH (ref 0.0–1.2)
Total Protein: 7.6 g/dL (ref 6.5–8.1)

## 2023-07-26 LAB — I-STAT CHEM 8, ED
BUN: 19 mg/dL (ref 6–20)
Calcium, Ion: 1.13 mmol/L — ABNORMAL LOW (ref 1.15–1.40)
Chloride: 105 mmol/L (ref 98–111)
Creatinine, Ser: 1 mg/dL (ref 0.61–1.24)
Glucose, Bld: 244 mg/dL — ABNORMAL HIGH (ref 70–99)
HCT: 51 % (ref 39.0–52.0)
Hemoglobin: 17.3 g/dL — ABNORMAL HIGH (ref 13.0–17.0)
Potassium: 4.1 mmol/L (ref 3.5–5.1)
Sodium: 138 mmol/L (ref 135–145)
TCO2: 20 mmol/L — ABNORMAL LOW (ref 22–32)

## 2023-07-26 LAB — DIFFERENTIAL
Abs Immature Granulocytes: 0.01 10*3/uL (ref 0.00–0.07)
Basophils Absolute: 0 10*3/uL (ref 0.0–0.1)
Basophils Relative: 1 %
Eosinophils Absolute: 0.1 10*3/uL (ref 0.0–0.5)
Eosinophils Relative: 2 %
Immature Granulocytes: 0 %
Lymphocytes Relative: 33 %
Lymphs Abs: 1.4 10*3/uL (ref 0.7–4.0)
Monocytes Absolute: 0.5 10*3/uL (ref 0.1–1.0)
Monocytes Relative: 12 %
Neutro Abs: 2.3 10*3/uL (ref 1.7–7.7)
Neutrophils Relative %: 52 %

## 2023-07-26 LAB — CBC
HCT: 49.3 % (ref 39.0–52.0)
Hemoglobin: 17.1 g/dL — ABNORMAL HIGH (ref 13.0–17.0)
MCH: 30.4 pg (ref 26.0–34.0)
MCHC: 34.7 g/dL (ref 30.0–36.0)
MCV: 87.7 fL (ref 80.0–100.0)
Platelets: 202 10*3/uL (ref 150–400)
RBC: 5.62 MIL/uL (ref 4.22–5.81)
RDW: 12.4 % (ref 11.5–15.5)
WBC: 4.4 10*3/uL (ref 4.0–10.5)
nRBC: 0 % (ref 0.0–0.2)

## 2023-07-26 LAB — RAPID URINE DRUG SCREEN, HOSP PERFORMED
Amphetamines: NOT DETECTED
Barbiturates: NOT DETECTED
Benzodiazepines: NOT DETECTED
Cocaine: NOT DETECTED
Opiates: NOT DETECTED
Tetrahydrocannabinol: NOT DETECTED

## 2023-07-26 LAB — PROTIME-INR
INR: 1.1 (ref 0.8–1.2)
Prothrombin Time: 14.3 s (ref 11.4–15.2)

## 2023-07-26 LAB — ETHANOL: Alcohol, Ethyl (B): <15 mg/dL (ref <15)

## 2023-07-26 LAB — TROPONIN I (HIGH SENSITIVITY): Troponin I (High Sensitivity): 6 ng/L (ref <18)

## 2023-07-26 LAB — APTT: aPTT: 29 s (ref 24–36)

## 2023-07-26 LAB — D-DIMER, QUANTITATIVE: D-Dimer, Quant: <0.27 ug{FEU}/mL (ref 0.00–0.50)

## 2023-07-26 MED ORDER — LACTATED RINGERS IV BOLUS
1000.0000 mL | Freq: Once | INTRAVENOUS | Status: AC
  Administered 2023-07-26: 1000 mL via INTRAVENOUS

## 2023-07-26 MED ORDER — DIAZEPAM 5 MG/ML IJ SOLN
5.0000 mg | Freq: Once | INTRAMUSCULAR | Status: AC | PRN
  Administered 2023-07-26: 5 mg via INTRAVENOUS
  Filled 2023-07-26: qty 2
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
## 2023-07-26 NOTE — Discharge Instructions (Signed)
 Return to the ER if you develop new or worsening trouble walking, headache, weakness or numbness in your extremities, vision changes, or any other new/concerning symptoms.
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
## 2023-07-27 ENCOUNTER — Ambulatory Visit: Payer: Managed Care, Other (non HMO) | Admitting: Internal Medicine

## 2023-07-27 ENCOUNTER — Other Ambulatory Visit: Payer: Self-pay

## 2023-07-27 ENCOUNTER — Other Ambulatory Visit (HOSPITAL_COMMUNITY): Payer: Self-pay

## 2023-07-27 ENCOUNTER — Other Ambulatory Visit: Payer: Self-pay | Admitting: Internal Medicine

## 2023-07-27 ENCOUNTER — Other Ambulatory Visit: Payer: Self-pay | Admitting: Cardiovascular Disease

## 2023-07-27 MED ORDER — ATORVASTATIN CALCIUM 80 MG PO TABS
80.0000 mg | ORAL_TABLET | Freq: Every day | ORAL | 3 refills | Status: DC
  Filled 2023-07-27: qty 90, 90d supply, fill #0
  Filled 2023-11-01: qty 90, 90d supply, fill #1
  Filled 2024-02-11: qty 90, 90d supply, fill #2

## 2023-07-28 ENCOUNTER — Encounter: Admitting: Gastroenterology

## 2023-07-28 ENCOUNTER — Other Ambulatory Visit (HOSPITAL_COMMUNITY): Payer: Self-pay

## 2023-07-28 MED ORDER — LOSARTAN POTASSIUM-HCTZ 100-25 MG PO TABS
1.0000 | ORAL_TABLET | Freq: Every day | ORAL | 2 refills | Status: DC
  Filled 2023-07-28: qty 89, 89d supply, fill #0
  Filled 2023-07-28: qty 1, 1d supply, fill #0
  Filled 2023-07-28: qty 89, 89d supply, fill #0
  Filled 2023-07-28: qty 1, 1d supply, fill #0
  Filled 2023-11-01: qty 90, 90d supply, fill #1
  Filled 2024-02-11: qty 90, 90d supply, fill #2

## 2023-07-28 MED ORDER — METOPROLOL SUCCINATE ER 50 MG PO TB24
150.0000 mg | ORAL_TABLET | Freq: Every day | ORAL | 2 refills | Status: DC
  Filled 2023-07-28: qty 270, 90d supply, fill #0
  Filled 2023-11-01: qty 270, 90d supply, fill #1
  Filled 2024-02-11: qty 270, 90d supply, fill #2

## 2023-08-11 ENCOUNTER — Emergency Department (HOSPITAL_COMMUNITY)

## 2023-08-11 ENCOUNTER — Other Ambulatory Visit: Payer: Self-pay

## 2023-08-11 ENCOUNTER — Encounter (HOSPITAL_COMMUNITY): Payer: Self-pay | Admitting: Emergency Medicine

## 2023-08-11 ENCOUNTER — Emergency Department (HOSPITAL_COMMUNITY)
Admission: EM | Admit: 2023-08-11 | Discharge: 2023-08-11 | Disposition: A | Discharge status: Home / Self Care | Attending: Emergency Medicine | Admitting: Emergency Medicine

## 2023-08-11 DIAGNOSIS — I10 Essential (primary) hypertension: Secondary | ICD-10-CM | POA: Insufficient documentation

## 2023-08-11 DIAGNOSIS — Z955 Presence of coronary angioplasty implant and graft: Secondary | ICD-10-CM | POA: Insufficient documentation

## 2023-08-11 DIAGNOSIS — Y906 Blood alcohol level of 120-199 mg/100 ml: Secondary | ICD-10-CM | POA: Insufficient documentation

## 2023-08-11 DIAGNOSIS — E860 Dehydration: Secondary | ICD-10-CM | POA: Diagnosis present

## 2023-08-11 DIAGNOSIS — I2510 Atherosclerotic heart disease of native coronary artery without angina pectoris: Secondary | ICD-10-CM | POA: Diagnosis not present

## 2023-08-11 DIAGNOSIS — Z79899 Other long term (current) drug therapy: Secondary | ICD-10-CM | POA: Diagnosis not present

## 2023-08-11 DIAGNOSIS — E119 Type 2 diabetes mellitus without complications: Secondary | ICD-10-CM | POA: Insufficient documentation

## 2023-08-11 LAB — CBC WITH DIFFERENTIAL/PLATELET
Abs Immature Granulocytes: 0.01 10*3/uL (ref 0.00–0.07)
Basophils Absolute: 0 10*3/uL (ref 0.0–0.1)
Basophils Relative: 1 %
Eosinophils Absolute: 0 10*3/uL (ref 0.0–0.5)
Eosinophils Relative: 1 %
HCT: 41.3 % (ref 39.0–52.0)
Hemoglobin: 14.3 g/dL (ref 13.0–17.0)
Immature Granulocytes: 0 %
Lymphocytes Relative: 38 %
Lymphs Abs: 1.6 10*3/uL (ref 0.7–4.0)
MCH: 31 pg (ref 26.0–34.0)
MCHC: 34.6 g/dL (ref 30.0–36.0)
MCV: 89.6 fL (ref 80.0–100.0)
Monocytes Absolute: 0.5 10*3/uL (ref 0.1–1.0)
Monocytes Relative: 11 %
Neutro Abs: 2.2 10*3/uL (ref 1.7–7.7)
Neutrophils Relative %: 49 %
Platelets: 173 10*3/uL (ref 150–400)
RBC: 4.61 MIL/uL (ref 4.22–5.81)
RDW: 12.7 % (ref 11.5–15.5)
WBC: 4.4 10*3/uL (ref 4.0–10.5)
nRBC: 0 % (ref 0.0–0.2)

## 2023-08-11 LAB — COMPREHENSIVE METABOLIC PANEL WITH GFR
ALT: 35 U/L (ref 0–44)
AST: 78 U/L — ABNORMAL HIGH (ref 15–41)
Albumin: 3.6 g/dL (ref 3.5–5.0)
Alkaline Phosphatase: 34 U/L — ABNORMAL LOW (ref 38–126)
Anion gap: 10 (ref 5–15)
BUN: 7 mg/dL (ref 6–20)
CO2: 21 mmol/L — ABNORMAL LOW (ref 22–32)
Calcium: 8.2 mg/dL — ABNORMAL LOW (ref 8.9–10.3)
Chloride: 107 mmol/L (ref 98–111)
Creatinine, Ser: 0.79 mg/dL (ref 0.61–1.24)
GFR, Estimated: >60 mL/min (ref >60)
Glucose, Bld: 165 mg/dL — ABNORMAL HIGH (ref 70–99)
Potassium: 3.2 mmol/L — ABNORMAL LOW (ref 3.5–5.1)
Sodium: 138 mmol/L (ref 135–145)
Total Bilirubin: 0.5 mg/dL (ref 0.0–1.2)
Total Protein: 6.8 g/dL (ref 6.5–8.1)

## 2023-08-11 LAB — TROPONIN I (HIGH SENSITIVITY): Troponin I (High Sensitivity): 5 ng/L (ref <18)

## 2023-08-11 LAB — ETHANOL: Alcohol, Ethyl (B): 176 mg/dL — ABNORMAL HIGH (ref <15)

## 2023-08-11 MED ORDER — METOPROLOL SUCCINATE ER 50 MG PO TB24
150.0000 mg | ORAL_TABLET | Freq: Once | ORAL | Status: AC
  Administered 2023-08-11: 150 mg via ORAL
  Filled 2023-08-11: qty 3

## 2023-08-11 MED ORDER — IOHEXOL 300 MG/ML  SOLN
100.0000 mL | Freq: Once | INTRAMUSCULAR | Status: AC | PRN
  Administered 2023-08-11: 100 mL via INTRAVENOUS

## 2023-08-11 MED ORDER — LACTATED RINGERS IV BOLUS
1000.0000 mL | Freq: Once | INTRAVENOUS | Status: AC
  Administered 2023-08-11: 1000 mL via INTRAVENOUS

## 2023-08-11 NOTE — ED Notes (Signed)
 PT is refusing lab work at this time. Pt is alert, oriented and able to answer questions appropriately
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
## 2023-08-11 NOTE — ED Provider Notes (Signed)
 I attempted to see the patient, but he requested to see the physician.     Vicky Charleston, PA-C 08/11/23 0535    Carita Senior, MD 08/11/23 [phone]
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
## 2023-08-11 NOTE — ED Notes (Signed)
 Pt rec'd 950 cc LR on second liter  Pt wanted LR stopped and he agreed with Dr Charlyn to have blood work completed and to include ethanol level at this time.  Pt had refused any and all blood work on my earlier attempt.  Pt stated he would after the second liter of fluid was complete. Blood work drawn and sent to lab with sunquest labels as ordered.

## 2023-08-17 ENCOUNTER — Telehealth: Payer: Self-pay

## 2023-08-17 NOTE — Telephone Encounter (Signed)
 [facility] Pre-operative Risk Assessment     Request for surgical clearance:     Endoscopy Procedure  What type of surgery is being performed?     Colon  When is this surgery scheduled?     TBA  What type of clearance is required ?   Pharmacy  Are there any medications that need to be held prior to surgery and how long? Plavix  5 days hold  Practice name and name of physician performing surgery?      [facility]  What is your office phone and fax number?      Phone- [phone]  Fax- [phone]  Anesthesia type (None, local, MAC, general) ?       MAC   Please route your response to [employer] CMA (AAMA)

## 2023-08-17 NOTE — Telephone Encounter (Signed)
 LVM for patient to call and see about setting up an appointment for a colonoscopy

## 2023-08-17 NOTE — Telephone Encounter (Signed)
 Tried contacting patient to ask him regarding medication no answer left a detailed vm to call back with information

## 2023-08-21 ENCOUNTER — Telehealth: Payer: Self-pay

## 2023-08-21 ENCOUNTER — Encounter: Payer: Self-pay | Admitting: Gastroenterology

## 2023-08-21 ENCOUNTER — Telehealth: Payer: Self-pay | Admitting: Gastroenterology

## 2023-08-21 NOTE — Telephone Encounter (Signed)
 Patient called and stated that he would like to have a MRI of his liver. Patient is requesting a call back.Please advise.

## 2023-08-21 NOTE — Telephone Encounter (Signed)
   Name: Peter Keller  DOB: 04/15/1975  MRN: [id number]  Primary Cardiologist: Lonni LITTIE Nanas, MD   Preoperative team, please contact this patient and set up a phone call appointment for further preoperative risk assessment. Please obtain consent and complete medication review. Thank you for your help.  I confirm that guidance regarding antiplatelet and oral anticoagulation therapy has been completed and, if necessary, noted below.  Per office protocol, if patient is without any new symptoms or concerns at the time of their virtual visit, he may hold Plavix  for 5 days prior to procedure. Please resume Plavix  as soon as possible postprocedure, at the discretion of the surgeon. We recommend patient take Aspirin  81 mg daily throughout the perioperative period.  Please discontinue Aspirin  upon resumption of Plavix .  I also confirmed the patient resides in the state of [place] . As per [facility] Medical Board telemedicine laws, the patient must reside in the state in which the provider is licensed.   Damien JAYSON Braver, NP 08/21/2023, 11:24 AM [facility]

## 2023-08-21 NOTE — Telephone Encounter (Signed)
 Spoke with pt regarding preop clearance request.  Pt stated he decided to continue taking Plavix  75mg  one tablet daily and he is not taking Aspirin  81mg  one tablet daily as disscused with Dr Victor at his last visit. Pt stated this decision is based on his family history.  All parties will be updated with pts preference.

## 2023-08-21 NOTE — Telephone Encounter (Signed)
 Spoke with pt regarding preop clearance request. Pt stated he decided to continue taking Plavix  75mg  one tablet daily and he is not taking Aspirin  81mg  one tablet daily as disscused with Dr Victor at his last visit. Pt stated this decision is based on his family history. All parties will be updated with pts preference.       Pt requesting refill on Plavix . Please address. Thank you

## 2023-08-21 NOTE — Telephone Encounter (Signed)
 Dr. Charlanne, patient is requesting an MRI of liver due to findings on 08/11/23 CT scan. Please advise, thanks  MPRESSION: No definite traumatic injury seen in the chest, abdomen or pelvis.   12 mm ill-defined area of enhancement seen in right hepatic lobe. Neoplasm cannot be excluded. When the patient is clinically stable and able to follow directions and hold their breath (preferably as an outpatient) further evaluation with dedicated abdominal MRI should be considered.

## 2023-08-21 NOTE — Telephone Encounter (Signed)
 LVM asking pt to call our office to schedule a virtual visit for preop clearance. Please reach out to preop team if pt returns call. Thank you

## 2023-08-22 ENCOUNTER — Other Ambulatory Visit: Payer: Self-pay

## 2023-08-22 DIAGNOSIS — R9389 Abnormal findings on diagnostic imaging of other specified body structures: Secondary | ICD-10-CM

## 2023-08-22 NOTE — Telephone Encounter (Signed)
 That is fine if he wishes to remain on plavix .  But for the procedure, he needs to be off plavix  for 5 days prior.  Given history of coronary stenting, would recommend that for those 5 days he is not taking plavix , he instead take daily aspirin  81 mg

## 2023-08-22 NOTE — Telephone Encounter (Signed)
 Pt made aware of Dr. Charlanne recommendations: Pt was scheduled for the MRI on 08/25/2023 at 5:30 PM at [facility].  Pt to arrive by 5:00 PM   Nothing to eat or drink 4 hours prior.  Pt made aware.  Pt verbalized understanding with all questions answered.

## 2023-08-22 NOTE — Progress Notes (Signed)
 I

## 2023-08-22 NOTE — Telephone Encounter (Signed)
   Patient Name: Mung Rinker  DOB: Sep 06, 1975 MRN: [id number]  Primary Cardiologist: Lonni LITTIE Nanas, MD  Chart reviewed as part of pre-operative protocol coverage. Given past medical history and time since last visit, based on ACC/AHA guidelines, Kyair Schara is at acceptable risk for the planned procedure without further cardiovascular testing.   Per Dr. Nanas, That is fine if he wishes to remain on plavix .  But for the procedure, he needs to be off plavix  for 5 days prior.  Given history of coronary stenting, would recommend that for those 5 days he is not taking plavix , he instead take daily aspirin  81 mg.  Therefore, he may hold Plavix  for 5 days prior to procedure. Please resume Plavix  as soon as possible postprocedure, at the discretion of the surgeon. We recommend patient take Aspirin  81 mg daily throughout the perioperative period.  Please discontinue Aspirin  upon resumption of Plavix .  I will route this recommendation to the requesting party via Epic fax function and remove from pre-op pool.  Please call with questions.  Damien JAYSON Braver, NP 08/22/2023, 3:41 PM

## 2023-08-22 NOTE — Telephone Encounter (Signed)
 Called and made patient aware per Dr. Kate  it is ok to continue Plavix  but to stop Plavix  five days before Colonoscopy procedure. Patient verbalized an understanding.

## 2023-08-22 NOTE — Telephone Encounter (Signed)
 I s/w the pt and he tells me that we called him earlier today and told him he is cleared and ok to proceed with his colonoscopy. I found notes in the chart confirming this. I explained to the pt that I am going to d/w preop APP to see if he still needs the tele preop appt.   I did explain to the pt that there is a protocol. Pt is insistent that Dr. Kate cleared him and about the Plavix .

## 2023-08-22 NOTE — Telephone Encounter (Signed)
 Scheduled 9-4 for previsit and 9-5 for procedure

## 2023-08-22 NOTE — Telephone Encounter (Signed)
 Proceed with MRI liver with and without contrast ASAP. RE: abn CT RG

## 2023-08-22 NOTE — Telephone Encounter (Signed)
 Tried calling patient to make them aware clearance has been faxed to surgeon's office no answer left a detailed vm

## 2023-08-25 ENCOUNTER — Ambulatory Visit (HOSPITAL_COMMUNITY)
Admission: RE | Admit: 2023-08-25 | Discharge: 2023-08-25 | Disposition: A | Discharge status: Home / Self Care | Source: Ambulatory Visit | Attending: Gastroenterology | Admitting: Gastroenterology

## 2023-08-25 DIAGNOSIS — R9389 Abnormal findings on diagnostic imaging of other specified body structures: Secondary | ICD-10-CM | POA: Insufficient documentation

## 2023-08-25 MED ORDER — GADOBUTROL 1 MMOL/ML IV SOLN
8.0000 mL | Freq: Once | INTRAVENOUS | Status: AC | PRN
  Administered 2023-08-25: 8 mL via INTRAVENOUS

## 2023-10-19 ENCOUNTER — Ambulatory Visit (AMBULATORY_SURGERY_CENTER)

## 2023-10-19 ENCOUNTER — Other Ambulatory Visit (HOSPITAL_COMMUNITY): Payer: Self-pay

## 2023-10-19 VITALS — Ht 69.0 in | Wt 190.0 lb

## 2023-10-19 DIAGNOSIS — Z1211 Encounter for screening for malignant neoplasm of colon: Secondary | ICD-10-CM

## 2023-10-19 MED ORDER — NA SULFATE-K SULFATE-MG SULF 17.5-3.13-1.6 GM/177ML PO SOLN
1.0000 | Freq: Once | ORAL | 0 refills | Status: AC
  Filled 2023-10-19: qty 354, 1d supply, fill #0

## 2023-10-19 NOTE — Progress Notes (Signed)
 No issues known to pt with past sedation with any surgeries or procedures Patient denies ever being told they had issues or difficulty with intubation  No FH of Malignant Hyperthermia Pt is not on diet pills; does take GLP-1 medication Pt is not on home 02  Pt is on blood thinner; understands MD-directed hold time prior to procedure  Pt denies issues with chronic constipation  No A fib or A flutter Have any cardiac testing pending--no Pt instructed to use Singlecare.com or GoodRx for a price reduction on prep  Ambulates independently

## 2023-10-30 ENCOUNTER — Encounter: Admitting: Gastroenterology

## 2023-10-30 ENCOUNTER — Other Ambulatory Visit (HOSPITAL_COMMUNITY): Payer: Self-pay

## 2023-11-01 ENCOUNTER — Other Ambulatory Visit: Payer: Self-pay | Admitting: Internal Medicine

## 2023-11-01 ENCOUNTER — Other Ambulatory Visit: Payer: Self-pay

## 2023-11-01 DIAGNOSIS — E1169 Type 2 diabetes mellitus with other specified complication: Secondary | ICD-10-CM

## 2023-11-01 NOTE — Telephone Encounter (Signed)
  Return in about 4 months (around 07/12/2023).

## 2023-11-01 NOTE — Telephone Encounter (Signed)
 LMx1 to schedule follow up appointment with Dr. Trixie.

## 2023-11-01 NOTE — Telephone Encounter (Signed)
 Patient is scheduled for Friday, 11/17/2023 at 2:00 PM with Dr. Trixie.

## 2023-11-02 ENCOUNTER — Other Ambulatory Visit (HOSPITAL_COMMUNITY): Payer: Self-pay

## 2023-11-02 MED ORDER — JARDIANCE 25 MG PO TABS
25.0000 mg | ORAL_TABLET | Freq: Every day | ORAL | 1 refills | Status: DC
  Filled 2023-11-02 – 2023-11-14 (×2): qty 90, 90d supply, fill #0
  Filled 2024-02-11: qty 90, 90d supply, fill #1

## 2023-11-02 MED ORDER — MOUNJARO 10 MG/0.5ML ~~LOC~~ SOAJ
10.0000 mg | SUBCUTANEOUS | 1 refills | Status: AC
  Filled 2023-11-02 – 2023-11-14 (×2): qty 6, 84d supply, fill #0
  Filled 2024-02-11: qty 6, 84d supply, fill #1

## 2023-11-02 MED ORDER — METFORMIN HCL ER 500 MG PO TB24
2000.0000 mg | ORAL_TABLET | Freq: Every day | ORAL | 1 refills | Status: DC
  Filled 2023-11-02 – 2023-11-14 (×2): qty 360, 90d supply, fill #0
  Filled 2024-02-11: qty 360, 90d supply, fill #1

## 2023-11-13 ENCOUNTER — Other Ambulatory Visit (HOSPITAL_COMMUNITY): Payer: Self-pay

## 2023-11-14 ENCOUNTER — Other Ambulatory Visit (HOSPITAL_COMMUNITY): Payer: Self-pay

## 2023-11-17 ENCOUNTER — Encounter: Payer: Self-pay | Admitting: Internal Medicine

## 2023-11-17 ENCOUNTER — Other Ambulatory Visit: Payer: Self-pay

## 2023-11-17 ENCOUNTER — Ambulatory Visit: Admitting: Internal Medicine

## 2023-11-17 ENCOUNTER — Other Ambulatory Visit (HOSPITAL_COMMUNITY): Payer: Self-pay

## 2023-11-17 ENCOUNTER — Other Ambulatory Visit

## 2023-11-17 VITALS — BP 120/66 | HR 105 | Ht 69.0 in | Wt 195.4 lb

## 2023-11-17 DIAGNOSIS — Z7984 Long term (current) use of oral hypoglycemic drugs: Secondary | ICD-10-CM

## 2023-11-17 DIAGNOSIS — Z7985 Long-term (current) use of injectable non-insulin antidiabetic drugs: Secondary | ICD-10-CM

## 2023-11-17 DIAGNOSIS — E66811 Obesity, class 1: Secondary | ICD-10-CM | POA: Diagnosis not present

## 2023-11-17 DIAGNOSIS — E1169 Type 2 diabetes mellitus with other specified complication: Secondary | ICD-10-CM | POA: Diagnosis not present

## 2023-11-17 DIAGNOSIS — Z6832 Body mass index (BMI) 32.0-32.9, adult: Secondary | ICD-10-CM

## 2023-11-17 DIAGNOSIS — E785 Hyperlipidemia, unspecified: Secondary | ICD-10-CM

## 2023-11-17 DIAGNOSIS — E6609 Other obesity due to excess calories: Secondary | ICD-10-CM

## 2023-11-17 LAB — POCT GLYCOSYLATED HEMOGLOBIN (HGB A1C): Hemoglobin A1C: 6.6 % — AB (ref 4.0–5.6)

## 2023-11-17 MED ORDER — FREESTYLE LIBRE 3 PLUS SENSOR MISC
1.0000 | 3 refills | Status: DC
  Filled 2023-11-17: qty 6, 90d supply, fill #0

## 2023-11-17 NOTE — Addendum Note (Signed)
 Addended by: CLEOTILDE ROLIN RAMAN on: 11/17/2023 03:20 PM   Modules accepted: Orders

## 2023-11-17 NOTE — Patient Instructions (Signed)
 Please continue: - Metformin  ER 1000-1500 mg in a.m. - Jardiance  25 mg daily in am - Mounjaro  10 mg weekly  Please return in 4 months.

## 2023-11-17 NOTE — Progress Notes (Addendum)
 Patient ID: Peter Keller, male   DOB: Dec 31, 1975, 48 y.o.   MRN: [id number]  HPI: Peter Keller is a 48 y.o.-year-old male, returning for follow-up for DM2, dx in 04/2013 (A1c was 11%), non-insulin -dependent, uncontrolled, with long term complications (CAD - s/p drug-eluting stent).  Last visit 8 months ago.  Interim history: No increased urination, nausea, chest pain.  He does have blurry vision and ophthalmologist advised him to wear reading glasses. Before last visit, he has increased his green tea consumption and is trying to use this to replace alcohol.  Earlier in the year, he had another period of time off alcohol, but he restarted it.  Now drinking more hard liquor. He does feel more dehydrated.  DM2: Reviewed HbA1c levels: Lab Results  Component Value Date   HGBA1C 6.6 (A) 03/14/2023   HGBA1C 7.3 (A) 10/18/2022   HGBA1C 6.6 (A) 02/23/2022   He is on: - Metformin  1000 mg 2x a day with meals - may forget the dose at night >> metformin  ER 2000 >> 1500-2000 mg >> 1000-1500 mg in the a.m.  - Mounjaro  5 >> 10 mg >> 10 >> 12.5 >> 10 weekly - Jardiance  25 mg daily Patient was initially on Janumet  extended-release, which was subsequently stopped.  Amaryl  was added 07/2013, but then switched to Invokana  100 mg daily in 09/2013 because of weight gain. Trulicity  0.75 mg weekly was added in 11/2013 for help with weight loss >> increased to 1.5 mg weekly in 01/2014 >> increased to 3 mg weekly and 01/2019. Previously on metformin  extended-release - started  in 03/2014 Previously on Invokana  and Farxiga .  Now on Jardiance  per insurance preference He came off glipizide , previously used before a larger dinner.  He is checking blood sugars with the freestyle libre CGM:   Previously: - am: 100 - 1h later: 130s - 2h after lunch: 170-180 - dinner: ? - 2h after dinner: ? - bedtime: ?  Previously:   Previously:   Lowest sugar was 60s >> n/c >> 70; it is unclear at which CBG level he has  hypoglycemia awareness Highest sugar was 260s >> n/c >> 200.  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   He was exercising regularly at the gym >>  exerciseing with personal trainer at home >> not now.  -No CKD, last BUN/creatinine:  Lab Results  Component Value Date   BUN 7 08/11/2023   CREATININE 0.79 08/11/2023   No results found for: MICRALBCREAT  -+ HL;  last set of lipids: Lab Results  Component Value Date   CHOL 110 10/18/2022   HDL 33.00 (L) 10/18/2022   LDLCALC 37 10/18/2022   LDLDIRECT 74.0 02/23/2022   TRIG 201.0 (H) 10/18/2022   CHOLHDL 3 10/18/2022  On Lipitor 20 >> 40 >> 80.  We restarted fenofibrate  at 160 mg daily.  He was started on Vascepa  2 g twice a day, but this was causing diarrhea >> we stopped this.  Cardiology added Zetia  10 mg daily >> came off few mo ago.  - last eye exam was 04/25/2023: + DR. Dr. Glendia Gaudy.  -No numbness and tingling in his feet.  Last foot exam 10/18/2022.  She also has a history of HTN. When I saw him in 01/2017, he just went skiing and forgot BP meds: drank 2 cups of coffee >> HR 125-145 and BP when he returned 213/105.  He was started on blood pressure medication then. He also has a history of GERD.  ROS: + see HPI  I reviewed  pt's medications, allergies, PMH, social hx, family hx, and changes were documented in the history of present illness. Otherwise, unchanged from my initial visit note.  Past Medical History:  Diagnosis Date   Alcohol dependence (HCC)    Diabetes (HCC)    GERD (gastroesophageal reflux disease)    Hepatic steatosis    Hyperlipidemia    Hypertension    Obesity    Sinus tachycardia    Past Surgical History:  Procedure Laterality Date   CORONARY STENT INTERVENTION N/A 07/24/2020   Procedure: CORONARY STENT INTERVENTION;  Surgeon: Verlin Lonni BIRCH, MD;  Location: [facility];  Service: Cardiovascular;  Laterality: N/A;   CORONARY ULTRASOUND/IVUS N/A 07/24/2020   Procedure: Intravascular  Ultrasound/IVUS;  Surgeon: Verlin Lonni BIRCH, MD;  Location: [facility];  Service: Cardiovascular;  Laterality: N/A;   LEFT HEART CATH AND CORONARY ANGIOGRAPHY N/A 07/24/2020   Procedure: LEFT HEART CATH AND CORONARY ANGIOGRAPHY;  Surgeon: Verlin Lonni BIRCH, MD;  Location: [facility];  Service: Cardiovascular;  Laterality: N/A;   none     History   Social History   Marital Status: Married    Spouse Name: N/A   Number of Children: 2   Occupational History    Nurse, adult    Social History Main Topics   Smoking status: Never Smoker    Smokeless tobacco: Never Used   Alcohol Use: Yes     Comment: 3 alcoholic drinks daily   Drug Use: No   Current Outpatient Medications on File Prior to Visit  Medication Sig Dispense Refill   atorvastatin  (LIPITOR) 80 MG tablet Take 1 tablet (80 mg total) by mouth daily. 90 tablet 3   chlordiazePOXIDE (LIBRIUM) 25 MG capsule Take 25 mg by mouth 2 (two) times daily.     clopidogrel  (PLAVIX ) 75 MG tablet Take 1 tablet (75 mg total) by mouth daily. 90 tablet 3   Continuous Glucose Sensor (FREESTYLE LIBRE 3 SENSOR) MISC Change every 14 days as directed. 6 each 3   ezetimibe  (ZETIA ) 10 MG tablet Take 1 tablet (10 mg total) by mouth daily. 90 tablet 3   fenofibrate  160 MG tablet Take 1 tablet (160 mg total) by mouth daily. 90 tablet 1   folic acid  (FOLVITE ) 1 MG tablet Take 1 mg by mouth daily.     JARDIANCE  25 MG TABS tablet Take 1 tablet (25 mg total) by mouth daily. 90 tablet 1   losartan -hydrochlorothiazide  (HYZAAR ) 100-25 MG tablet Take 1 tablet by mouth daily. 90 tablet 2   metFORMIN  (GLUCOPHAGE -XR) 500 MG 24 hr tablet Take 4 tablets (2,000 mg total) by mouth daily with breakfast. 360 tablet 1   metoprolol  succinate (TOPROL  XL) 50 MG 24 hr tablet Take 3 tablets (150 mg total) by mouth daily. Take with or immediately following a meal 270 tablet 2   mirtazapine (REMERON) 15 MG tablet Take 15 mg by mouth at bedtime.      nitroGLYCERIN  (NITROSTAT ) 0.4 MG SL tablet Place 1 tablet under the tongue every 5 minutes for 3 doses as needed for chest pain 25 tablet 1   pantoprazole  (PROTONIX ) 40 MG tablet Take 1 tablet (40 mg total) by mouth 2 (two) times daily. 180 tablet 1   thiamine (VITAMIN B1) 100 MG tablet Take 100 mg by mouth daily.     tirzepatide  (MOUNJARO ) 10 MG/0.5ML Pen Inject 10 mg into the skin once a week. 6 mL 1   traZODone (DESYREL) 50 MG tablet Take 50-100 mg by  mouth at bedtime as needed.     No current facility-administered medications on file prior to visit.   No Known Allergies Family History  Problem Relation Age of Onset   Hypertension Mother    Diabetes Mother    Heart disease Paternal Uncle    Heart attack Paternal Grandmother    Colon cancer Neg Hx    Esophageal cancer Neg Hx    Stomach cancer Neg Hx    Rectal cancer Neg Hx    Colon polyps Neg Hx    PE: BP 120/66   Pulse (!) 105   Ht 5' 9 (1.753 m)   Wt 195 lb 6.4 oz (88.6 kg)   SpO2 96%   BMI 28.86 kg/m  Wt Readings from Last 15 Encounters:  11/17/23 195 lb 6.4 oz (88.6 kg)  10/19/23 190 lb (86.2 kg)  07/26/23 185 lb (83.9 kg)  07/07/23 185 lb (83.9 kg)  03/21/23 189 lb 14.4 oz (86.1 kg)  03/14/23 191 lb 12.8 oz (87 kg)  10/18/22 203 lb 9.6 oz (92.4 kg)  03/23/22 199 lb 9.6 oz (90.5 kg)  02/23/22 197 lb 6.4 oz (89.5 kg)  12/08/21 194 lb 9.6 oz (88.3 kg)  10/20/21 194 lb 9.6 oz (88.3 kg)  08/19/21 191 lb 6.4 oz (86.8 kg)  06/22/21 197 lb 3.2 oz (89.4 kg)  04/29/21 197 lb 12.8 oz (89.7 kg)  03/17/21 201 lb 9.6 oz (91.4 kg)   Constitutional: overweight, in NAD Eyes: no exophthalmos ENT: no masses palpated in neck, no cervical lymphadenopathy Cardiovascular: tachycardia, RR, No MRG Respiratory: CTA B Musculoskeletal: no deformities Skin: no rashes Neurological: no tremor with outstretched hands Diabetic Foot Exam - Simple   Simple Foot Form Diabetic Foot exam was performed with the following findings: Yes  11/17/2023  2:18 PM  Visual Inspection No deformities, no ulcerations, no other skin breakdown bilaterally: Yes Sensation Testing Intact to touch and monofilament testing bilaterally: Yes Pulse Check Posterior Tibialis and Dorsalis pulse intact bilaterally: Yes Comments    ASSESSMENT: 1. DM2, non-insulin -dependent, uncontrolled, with complications: - CAD, s/p drug-eluting stent 07/2020  2. Hypertriglyceridemia  3.  Obesity class I  - Right upper quadrant ultrasound from 08/07/2013 shows probable fatty infiltration, confirmed on the abdominal MRI 08/13/2013 - AST> ALT, consistent with excessive alcohol use.  He continues to try to stop. Component     Latest Ref Rng 02/23/2022 06/10/2022 10/18/2022  Total Bilirubin     0.2 - 1.2 mg/dL 0.9   1.0   Alkaline Phosphatase     39 - 117 U/L 51   40   AST     0 - 37 U/L 81 (H)   38 (H)   ALT     0 - 53 U/L 56 (H)   29    PLAN:  1. Patient with history of uncontrolled type 2 diabetes, improved in 2022 after adjusting his diabetic regimen and starting on the CGM along with improved diet.  At last visit, HbA1c was at goal, at 6.6%, decreased from 7.8%.  He was taking a lower dose of Mounjaro , 10 mg weekly, due to decreased appetite with 12.5 mg dose. He was off the sensor and I encouraged him to restart it.  Sugars appear to be better controlled per his report, at goal in the morning, but increasing by approximately 30 mg/dL after waking up without him eating anything.  After lunch, sugars were higher, 1 70-1 80s and he was not checking consistently later in the day.  He was describing acid reflux with Mounjaro  and I recommended Mylanta, but if not helping, to reduce the dose of Mounjaro  further, to 7.5 mg weekly.  He was off his sulfonylurea and we continued without it.  He was doing a better job reducing alcohol.  He did not stop it completely before last visit but was trying to take 1 month off every 6 months and also replaced some of the alcohol  weekly. He now returns after longer absence. CGM interpretation: -At today's visit, we reviewed his CGM downloads: It appears that 81% of values are in target range (goal >70%), while 19% are higher than 180 (goal <25%), and 0% are lower than 70 (goal <4%).  The calculated average blood sugar is 152.  The projected HbA1c for the next 3 months (GMI) is 6.9%. -Reviewing the CGM trends, sugars appear to be mostly fluctuating within the target range but increasing occasionally after breakfast and lunch.  Later in the day they improved.  At today's visit, he mentions that he does feel he is dehydrated.  He is tachycardic at today's visit as he mentions that he did not bring a water bottle with him.  We discussed that alcohol can be a major cause of dehydration and I highly recommended to try to stop especially as he is on Mounjaro  which should also help and he has Librium at hand.  In the meantime, my recommendation would be to continue the same diabetic regimen.  He agrees with this plan. -I advised him to: Patient Instructions  Please continue: - Metformin  ER 1000-1500 mg in a.m. - Jardiance  25 mg daily in am - Mounjaro  10 mg weekly  Please return in 4 months.  - we checked his HbA1c: 6.6% (stable) - advised to check sugars at different times of the day - 4x a day, rotating check times - advised for yearly eye exams >> he is UTD - Will check annual labs today - return to clinic in 4 months  2. Hypertriglyceridemia - Reviewed latest lipid panel from a year ago: LDL at goal, triglycerides elevated, HDL low: Lab Results  Component Value Date   CHOL 110 10/18/2022   HDL 33.00 (L) 10/18/2022   LDLCALC 37 10/18/2022   LDLDIRECT 74.0 02/23/2022   TRIG 201.0 (H) 10/18/2022   CHOLHDL 3 10/18/2022  -He continues on Lipitor 80 mg daily, Tricor , 160 mg daily, but he stopped Zetia  10 mg daily. He could not tolerate Vascepa  due to diarrhea in the past. - Reducing alcohol will also greatly help improving  his triglycerides. - Will recheck his lipid panel today  3.  Overweight - will continue the SGLT2 inhibitor and GLP-1/GIP receptor agonist, which should also help with weight loss.  We previously had to reduce the dose of Mounjaro  due to lack of appetite and acid reflux symptoms. - He lost 12 pounds before last visit and gained 4 pounds since then  Component     Latest Ref Rng 11/17/2023  Sodium     135 - 146 mmol/L 137   Potassium     3.5 - 5.3 mmol/L 3.8   Chloride     98 - 110 mmol/L 100   CO2     20 - 32 mmol/L 22   Glucose     65 - 139 mg/dL 878   BUN     7 - 25 mg/dL 21   Creatinine     9.39 - 1.29 mg/dL 9.11   Total Bilirubin  0.2 - 1.2 mg/dL 1.0   AST     10 - 40 U/L 31   ALT     9 - 46 U/L 17   Total Protein     6.1 - 8.1 g/dL 7.9   Calcium      8.6 - 10.3 mg/dL 9.6   Hemoglobin J8R     4.0 - 5.6 % 6.6 !   Cholesterol     <200 mg/dL 863   Triglycerides     <150 mg/dL 672 (H)   HDL Cholesterol     > OR = 40 mg/dL 38 (L)   Total CHOL/HDL Ratio     <5.0 (calc) 3.6   eGFR     > OR = 60 mL/min/1.55m2 106   BUN/Creatinine Ratio     6 - 22 (calc) SEE NOTE:   Albumin MSPROF     3.6 - 5.1 g/dL 4.7   Globulin     1.9 - 3.7 g/dL (calc) 3.2   AG Ratio     1.0 - 2.5 (calc) 1.5   Alkaline phosphatase (APISO)     36 - 130 U/L 47   Creatinine, Urine     20 - 320 mg/dL 884   Microalb, Ur     mg/dL 1.0   MICROALB/CREAT RATIO     <30 mg/g creat 9   LDL Cholesterol (Calc)     mg/dL (calc) 64   Non-HDL Cholesterol (Calc)     <130 mg/dL (calc) 98   ACR normal. CMP normal. LDL at goal. TG elevated, increased. HDL slightly low.  Will check with him if he is taking the fenofibrate  consistently.  We may also need to add fish oil.  Lela Fendt, MD PhD [facility]

## 2023-11-18 LAB — MICROALBUMIN / CREATININE URINE RATIO
Creatinine, Urine: 115 mg/dL (ref 20–320)
Microalb Creat Ratio: 9 mg/g{creat} (ref <30)
Microalb, Ur: 1 mg/dL

## 2023-11-18 LAB — COMPREHENSIVE METABOLIC PANEL WITH GFR
AG Ratio: 1.5 (calc) (ref 1.0–2.5)
ALT: 17 U/L (ref 9–46)
AST: 31 U/L (ref 10–40)
Albumin: 4.7 g/dL (ref 3.6–5.1)
Alkaline phosphatase (APISO): 47 U/L (ref 36–130)
BUN: 21 mg/dL (ref 7–25)
CO2: 22 mmol/L (ref 20–32)
Calcium: 9.6 mg/dL (ref 8.6–10.3)
Chloride: 100 mmol/L (ref 98–110)
Creat: 0.88 mg/dL (ref 0.60–1.29)
Globulin: 3.2 g/dL (ref 1.9–3.7)
Glucose, Bld: 121 mg/dL (ref 65–139)
Potassium: 3.8 mmol/L (ref 3.5–5.3)
Sodium: 137 mmol/L (ref 135–146)
Total Bilirubin: 1 mg/dL (ref 0.2–1.2)
Total Protein: 7.9 g/dL (ref 6.1–8.1)
eGFR: 106 mL/min/1.73m2 (ref >=60)

## 2023-11-18 LAB — LIPID PANEL W/REFLEX DIRECT LDL
Cholesterol: 136 mg/dL (ref <200)
HDL: 38 mg/dL — ABNORMAL LOW (ref >=40)
LDL Cholesterol (Calc): 64 mg/dL
Non-HDL Cholesterol (Calc): 98 mg/dL (ref <130)
Total CHOL/HDL Ratio: 3.6 (calc) (ref <5.0)
Triglycerides: 327 mg/dL — ABNORMAL HIGH (ref <150)

## 2023-11-20 ENCOUNTER — Encounter: Payer: Self-pay | Admitting: Cardiology

## 2023-11-20 ENCOUNTER — Other Ambulatory Visit (HOSPITAL_COMMUNITY): Payer: Self-pay

## 2023-11-20 ENCOUNTER — Ambulatory Visit: Payer: Self-pay | Admitting: Internal Medicine

## 2023-11-20 ENCOUNTER — Ambulatory Visit: Attending: Cardiology | Admitting: Cardiology

## 2023-11-20 VITALS — BP 122/76 | HR 86 | Ht 69.0 in | Wt 190.2 lb

## 2023-11-20 DIAGNOSIS — E1169 Type 2 diabetes mellitus with other specified complication: Secondary | ICD-10-CM | POA: Diagnosis not present

## 2023-11-20 DIAGNOSIS — I2510 Atherosclerotic heart disease of native coronary artery without angina pectoris: Secondary | ICD-10-CM | POA: Diagnosis not present

## 2023-11-20 DIAGNOSIS — I10 Essential (primary) hypertension: Secondary | ICD-10-CM

## 2023-11-20 DIAGNOSIS — E785 Hyperlipidemia, unspecified: Secondary | ICD-10-CM | POA: Diagnosis not present

## 2023-11-20 MED ORDER — CLOPIDOGREL BISULFATE 75 MG PO TABS
75.0000 mg | ORAL_TABLET | Freq: Every day | ORAL | 3 refills | Status: DC
  Filled 2023-11-20 – 2024-02-11 (×2): qty 90, 90d supply, fill #0

## 2023-11-20 NOTE — Progress Notes (Signed)
 Cardiology Office Note:    Date:  11/20/2023   ID:  Peter Keller, DOB [birth year], MRN [id number]  PCP:  Rollene Almarie LABOR, MD  Cardiologist:  Lonni LITTIE Nanas, MD  Electrophysiologist:  None  Referring MD: Rollene Almarie LABOR, *   Chief Complaint  Patient presents with   Coronary Artery Disease    History of Present Illness:    Peter Keller is a 48 y.o. male with a hx of CAD status post stenting, hypertension, hyperlipidemia who presents for follow-up.  Previously followed with Dr. Alveta, last seen 11/2021.  Coronary CTA 07/23/2020 showed severe mid LAD stenosis, calcium  score 60 (92nd percentile).  Cath on 07/24/2020 showed severe proximal LAD stenosis status post DES, mild nonobstructive disease in ramus and distal LAD.  Echocardiogram 07/24/2020 showed EF 60 to 65%, normal RV function, no significant valvular disease.  Reported chest pain and underwent Lexiscan  Myoview  03/2022 which showed normal perfusion, EF 71%.  Since last clinic visit, he reports he is doing OK.  [place] an [place] of [place]igo [place].  He went to ED once he got back to [facility], reports symptoms improved with IV fluids.  He reports his nosebleeds have improved, he restarted Plavix  instead of aspirin .   Past Medical History:  Diagnosis Date   Alcohol dependence (HCC)    Diabetes (HCC)    GERD (gastroesophageal reflux disease)    Hepatic steatosis    Hyperlipidemia    Hypertension    Obesity    Sinus tachycardia     Past Surgical History:  Procedure Laterality Date   CORONARY STENT INTERVENTION N/A 07/24/2020   Procedure: CORONARY STENT INTERVENTION;  Surgeon: Verlin Lonni BIRCH, MD;  Location: [facility];  Service: Cardiovascular;  Laterality: N/A;   CORONARY ULTRASOUND/IVUS N/A 07/24/2020   Procedure: Intravascular Ultrasound/IVUS;  Surgeon: Verlin Lonni BIRCH, MD;  Location: [facility];  Service: Cardiovascular;  Laterality: N/A;   LEFT HEART CATH AND CORONARY  ANGIOGRAPHY N/A 07/24/2020   Procedure: LEFT HEART CATH AND CORONARY ANGIOGRAPHY;  Surgeon: Verlin Lonni BIRCH, MD;  Location: [facility];  Service: Cardiovascular;  Laterality: N/A;   none      Current Medications: Current Meds  Medication Sig   atorvastatin  (LIPITOR) 80 MG tablet Take 1 tablet (80 mg total) by mouth daily.   chlordiazePOXIDE (LIBRIUM) 25 MG capsule Take 25 mg by mouth 2 (two) times daily.   Continuous Glucose Sensor (FREESTYLE LIBRE 3 PLUS SENSOR) MISC Use to monitor blood glucose continuously. Change sensor every 15 days.   ezetimibe  (ZETIA ) 10 MG tablet Take 1 tablet (10 mg total) by mouth daily.   fenofibrate  160 MG tablet Take 1 tablet (160 mg total) by mouth daily.   folic acid  (FOLVITE ) 1 MG tablet Take 1 mg by mouth daily.   JARDIANCE  25 MG TABS tablet Take 1 tablet (25 mg total) by mouth daily.   losartan -hydrochlorothiazide  (HYZAAR ) 100-25 MG tablet Take 1 tablet by mouth daily.   metFORMIN  (GLUCOPHAGE -XR) 500 MG 24 hr tablet Take 4 tablets (2,000 mg total) by mouth daily with breakfast.   metoprolol  succinate (TOPROL  XL) 50 MG 24 hr tablet Take 3 tablets (150 mg total) by mouth daily. Take with or immediately following a meal   mirtazapine (REMERON) 15 MG tablet Take 15 mg by mouth at bedtime.   nitroGLYCERIN  (NITROSTAT ) 0.4 MG SL tablet Place 1 tablet under the tongue every 5 minutes for 3 doses as needed for chest pain   pantoprazole  (PROTONIX ) 40  MG tablet Take 1 tablet (40 mg total) by mouth 2 (two) times daily.   thiamine (VITAMIN B1) 100 MG tablet Take 100 mg by mouth daily.   tirzepatide  (MOUNJARO ) 10 MG/0.5ML Pen Inject 10 mg into the skin once a week.   traZODone (DESYREL) 50 MG tablet Take 50-100 mg by mouth at bedtime as needed.   [DISCONTINUED] clopidogrel  (PLAVIX ) 75 MG tablet Take 1 tablet (75 mg total) by mouth daily.     Allergies:   Patient has no known allergies.   Social History   Socioeconomic History   Marital status:  Married    Spouse name: Not on file   Number of children: Not on file   Years of education: Not on file   Highest education level: Not on file  Occupational History   Not on file  Tobacco Use   Smoking status: Never   Smokeless tobacco: Never  Vaping Use   Vaping status: Never Used  Substance and Sexual Activity   Alcohol use: Yes    Alcohol/week: 3.0 standard drinks of alcohol    Types: 3 Glasses of wine per week    Comment: 1-2 alcoholic drinks daily   Drug use: No   Sexual activity: Not on file  Other Topics Concern   Not on file  Social History Narrative   Not on file   Social Drivers of Health   Financial Resource Strain: Not on file  Food Insecurity: Not on file  Transportation Needs: Not on file  Physical Activity: Not on file  Stress: Not on file  Social Connections: Not on file     Family History: The patient's family history includes Diabetes in his mother; Heart attack in his paternal grandmother; Heart disease in his paternal uncle; Hypertension in his mother. There is no history of Colon cancer, Esophageal cancer, Stomach cancer, Rectal cancer, or Colon polyps.  ROS:   Please see the history of present illness.     All other systems reviewed and are negative.  EKGs/Labs/Other Studies Reviewed:    The following studies were reviewed today:   EKG:  EKG is not ordered today.    Recent Labs: 08/11/2023: Hemoglobin 14.3; Platelets 173 11/17/2023: ALT 17; BUN 21; Creat 0.88; Potassium 3.8; Sodium 137  Recent Lipid Panel    Component Value Date/Time   CHOL 136 11/17/2023 1432   CHOL 120 06/10/2022 1321   TRIG 327 (H) 11/17/2023 1432   HDL 38 (L) 11/17/2023 1432   HDL 29 (L) 06/10/2022 1321   CHOLHDL 3.6 11/17/2023 1432   VLDL 40.2 (H) 10/18/2022 1500   LDLCALC 64 11/17/2023 1432   LDLDIRECT 74.0 02/23/2022 1043    Physical Exam:    VS:  BP 122/76   Pulse 86   Ht 5' 9 (1.753 m)   Wt 190 lb 3.2 oz (86.3 kg)   SpO2 98%   BMI 28.09 kg/m      Wt Readings from Last 3 Encounters:  11/20/23 190 lb 3.2 oz (86.3 kg)  11/17/23 195 lb 6.4 oz (88.6 kg)  10/19/23 190 lb (86.2 kg)     GEN:  Well nourished, well developed in no acute distress HEENT: Normal NECK: No JVD; No carotid bruits CARDIAC: RRR, no murmurs, rubs, gallops RESPIRATORY:  Clear to auscultation without rales, wheezing or rhonchi  ABDOMEN: Soft, non-tender, non-distended MUSCULOSKELETAL:  No edema; No deformity  SKIN: Warm and dry NEUROLOGIC:  Alert and oriented x 3 PSYCHIATRIC:  Normal affect   ASSESSMENT:  1. Coronary artery disease involving native coronary artery of native heart, unspecified whether angina present   2. Essential hypertension, benign   3. Hyperlipidemia associated with type 2 diabetes mellitus (HCC)     PLAN:    CAD: Coronary CTA 07/23/2020 showed severe mid LAD stenosis, calcium  score 60 (92nd percentile).  Cath on 07/24/2020 showed severe mid LAD stenosis status post DES, mild nonobstructive disease in ramus and distal LAD.  Echocardiogram 07/24/2020 showed EF 60 to 65%, normal RV function, no significant valvular disease.  Reported chest pain and underwent Lexiscan  Myoview  03/2022 which showed normal perfusion, EF 71%. -On Plavix  75 mg daily.  Had been having some epistaxis, switched to aspirin  81 mg daily.  He reports his nosebleeds have improved and his preference is to stay on Plavix , will continue Plavix  75 mg daily -Continue atorvastatin  80 mg daily and Zetia  10 mg daily -Continue Toprol -XL 150 mg daily  Hypertension: On Toprol -XL 150 mg daily, losartan -HCTZ 100-25 mg daily.  Appears controlled  Hyperlipidemia: On atorvastatin  80 mg daily and Zetia  10 mg daily.  LDL 64 on 11/17/23.  High triglycerides but reports was not fasting for study, will repeat fasting lipid panel  T2DM: On Jardiance , metformin , Mounjaro .  A1c 6.6% on 11/17/23   RTC in 6 months    Medication Adjustments/Labs and Tests Ordered: Current medicines are  reviewed at length with the patient today.  Concerns regarding medicines are outlined above.  Orders Placed This Encounter  Procedures   Basic metabolic panel with GFR   Magnesium    Lipid panel   Meds ordered this encounter  Medications   clopidogrel  (PLAVIX ) 75 MG tablet    Sig: Take 1 tablet (75 mg total) by mouth daily.    Dispense:  90 tablet    Refill:  3    Patient Instructions  Medication Instructions:  Your physician recommends that you continue on your current medications as directed. Please refer to the Current Medication list given to you today.  *If you need a refill on your cardiac medications before your next appointment, please call your pharmacy*  Lab Work: Your physician recommends that you return for lab work in: the next week or 2 for FASTING BMET, Magnesium , & Lipids  If you have labs (blood work) drawn today and your tests are completely normal, you will receive your results only by: MyChart Message (if you have MyChart) OR A paper copy in the mail If you have any lab test that is abnormal or we need to change your treatment, we will call you to review the results.   Follow-Up: At [facility], you and your health needs are our priority.  As part of our continuing mission to provide you with exceptional heart care, our providers are all part of one team.  This team includes your primary Cardiologist (physician) and Advanced Practice Providers or APPs (Physician Assistants and Nurse Practitioners) who all work together to provide you with the care you need, when you need it.  Your next appointment:   6 month(s)  Provider:   Lonni LITTIE Nanas, MD     Signed, Lonni LITTIE Nanas, MD  11/20/2023 4:51 PM    [facility]

## 2023-11-20 NOTE — Patient Instructions (Signed)
 Medication Instructions:  Your physician recommends that you continue on your current medications as directed. Please refer to the Current Medication list given to you today.  *If you need a refill on your cardiac medications before your next appointment, please call your pharmacy*  Lab Work: Your physician recommends that you return for lab work in: the next week or 2 for FASTING BMET, Magnesium , & Lipids  If you have labs (blood work) drawn today and your tests are completely normal, you will receive your results only by: MyChart Message (if you have MyChart) OR A paper copy in the mail If you have any lab test that is abnormal or we need to change your treatment, we will call you to review the results.   Follow-Up: At [facility], you and your health needs are our priority.  As part of our continuing mission to provide you with exceptional heart care, our providers are all part of one team.  This team includes your primary Cardiologist (physician) and Advanced Practice Providers or APPs (Physician Assistants and Nurse Practitioners) who all work together to provide you with the care you need, when you need it.  Your next appointment:   6 month(s)  Provider:   Lonni LITTIE Nanas, MD

## 2023-11-24 ENCOUNTER — Other Ambulatory Visit: Payer: Self-pay

## 2023-11-27 ENCOUNTER — Encounter: Payer: Self-pay | Admitting: Gastroenterology

## 2023-11-30 ENCOUNTER — Telehealth: Payer: Self-pay

## 2023-11-30 ENCOUNTER — Other Ambulatory Visit (HOSPITAL_COMMUNITY): Payer: Self-pay

## 2023-11-30 NOTE — Telephone Encounter (Signed)
 Colon cancelled for tomorrow, per Dr. Charlanne patient will let us  know when he can [email or web address]

## 2023-12-01 ENCOUNTER — Encounter: Admitting: Gastroenterology

## 2023-12-14 ENCOUNTER — Other Ambulatory Visit (HOSPITAL_COMMUNITY): Payer: Self-pay

## 2024-02-11 ENCOUNTER — Other Ambulatory Visit: Payer: Self-pay

## 2024-02-11 ENCOUNTER — Other Ambulatory Visit (HOSPITAL_COMMUNITY): Payer: Self-pay

## 2024-02-12 ENCOUNTER — Other Ambulatory Visit: Payer: Self-pay

## 2024-02-14 ENCOUNTER — Other Ambulatory Visit: Payer: Self-pay

## 2024-02-14 ENCOUNTER — Other Ambulatory Visit (HOSPITAL_COMMUNITY): Payer: Self-pay

## 2024-02-21 ENCOUNTER — Other Ambulatory Visit: Payer: Self-pay | Admitting: Internal Medicine

## 2024-02-21 ENCOUNTER — Other Ambulatory Visit: Payer: Self-pay

## 2024-02-21 ENCOUNTER — Other Ambulatory Visit (HOSPITAL_COMMUNITY): Payer: Self-pay

## 2024-02-21 ENCOUNTER — Other Ambulatory Visit: Payer: Self-pay | Admitting: Cardiology

## 2024-02-21 MED ORDER — FENOFIBRATE 160 MG PO TABS
160.0000 mg | ORAL_TABLET | Freq: Every day | ORAL | 1 refills | Status: DC
  Filled 2024-02-21: qty 90, 90d supply, fill #0

## 2024-02-21 MED ORDER — FENOFIBRATE 160 MG PO TABS
160.0000 mg | ORAL_TABLET | Freq: Every day | ORAL | 1 refills | Status: AC
  Filled 2024-02-23: qty 90, 90d supply, fill #0

## 2024-02-22 ENCOUNTER — Encounter (HOSPITAL_COMMUNITY): Payer: Self-pay

## 2024-02-22 ENCOUNTER — Other Ambulatory Visit: Payer: Self-pay

## 2024-02-22 ENCOUNTER — Other Ambulatory Visit (HOSPITAL_COMMUNITY): Payer: Self-pay

## 2024-02-22 DIAGNOSIS — E1169 Type 2 diabetes mellitus with other specified complication: Secondary | ICD-10-CM

## 2024-02-22 MED ORDER — ATORVASTATIN CALCIUM 80 MG PO TABS: 80.0000 mg | ORAL_TABLET | Freq: Every day | ORAL | 3 refills | Status: AC

## 2024-02-23 ENCOUNTER — Other Ambulatory Visit: Payer: Self-pay | Admitting: Cardiology

## 2024-02-23 ENCOUNTER — Other Ambulatory Visit (HOSPITAL_COMMUNITY): Payer: Self-pay

## 2024-02-26 ENCOUNTER — Other Ambulatory Visit: Payer: Self-pay | Admitting: Student

## 2024-02-26 ENCOUNTER — Other Ambulatory Visit (HOSPITAL_COMMUNITY): Payer: Self-pay

## 2024-02-26 ENCOUNTER — Encounter (HOSPITAL_COMMUNITY): Payer: Self-pay

## 2024-02-26 DIAGNOSIS — Z79899 Other long term (current) drug therapy: Secondary | ICD-10-CM

## 2024-02-26 DIAGNOSIS — E1169 Type 2 diabetes mellitus with other specified complication: Secondary | ICD-10-CM

## 2024-02-26 MED ORDER — CLOPIDOGREL BISULFATE 75 MG PO TABS: 75.0000 mg | ORAL_TABLET | Freq: Every day | ORAL | 3 refills | Status: AC

## 2024-02-26 MED ORDER — FENOFIBRATE 160 MG PO TABS: 160.0000 mg | ORAL_TABLET | Freq: Every day | ORAL | 2 refills | Status: AC

## 2024-02-26 MED ORDER — EZETIMIBE 10 MG PO TABS: 10.0000 mg | ORAL_TABLET | Freq: Every day | ORAL | 0 refills | Status: AC

## 2024-02-26 MED ORDER — METOPROLOL SUCCINATE ER 50 MG PO TB24: 150.0000 mg | ORAL_TABLET | Freq: Every day | ORAL | 2 refills | Status: AC

## 2024-02-26 MED ORDER — LOSARTAN POTASSIUM-HCTZ 100-25 MG PO TABS: 1.0000 | ORAL_TABLET | Freq: Every day | ORAL | 2 refills | Status: AC

## 2024-03-04 ENCOUNTER — Other Ambulatory Visit: Payer: Self-pay

## 2024-03-04 DIAGNOSIS — E1169 Type 2 diabetes mellitus with other specified complication: Secondary | ICD-10-CM

## 2024-03-04 MED ORDER — METFORMIN HCL ER 500 MG PO TB24: 2000.0000 mg | ORAL_TABLET | Freq: Every day | ORAL | 1 refills | Status: AC

## 2024-03-04 MED ORDER — JARDIANCE 25 MG PO TABS: 25.0000 mg | ORAL_TABLET | Freq: Every day | ORAL | 1 refills | Status: AC

## 2024-03-04 MED ORDER — FREESTYLE LIBRE 3 PLUS SENSOR MISC: 1.0000 | 3 refills | Status: AC

## 2024-03-11 MED ORDER — PANTOPRAZOLE SODIUM 40 MG PO TBEC: 40.0000 mg | DELAYED_RELEASE_TABLET | Freq: Two times a day (BID) | ORAL | 2 refills | Status: AC

## 2024-03-19 ENCOUNTER — Ambulatory Visit: Admitting: Internal Medicine

## 2024-04-16 ENCOUNTER — Ambulatory Visit: Admitting: Internal Medicine
